# Patient Record
Sex: Male | Born: 1965
Health system: Southern US, Community
[De-identification: ages and names within clinical notes are randomized; demographics above are authoritative.]

## PROBLEM LIST (undated history)

## (undated) DIAGNOSIS — G4730 Sleep apnea, unspecified: Secondary | ICD-10-CM

## (undated) DIAGNOSIS — F32A Depression, unspecified: Secondary | ICD-10-CM

## (undated) DIAGNOSIS — I10 Essential (primary) hypertension: Secondary | ICD-10-CM

## (undated) DIAGNOSIS — I639 Cerebral infarction, unspecified: Secondary | ICD-10-CM

## (undated) DIAGNOSIS — M1990 Unspecified osteoarthritis, unspecified site: Secondary | ICD-10-CM

## (undated) DIAGNOSIS — I700 Atherosclerosis of aorta: Secondary | ICD-10-CM

## (undated) DIAGNOSIS — G894 Chronic pain syndrome: Secondary | ICD-10-CM

## (undated) DIAGNOSIS — I517 Cardiomegaly: Secondary | ICD-10-CM

## (undated) DIAGNOSIS — R519 Headache, unspecified: Secondary | ICD-10-CM

## (undated) DIAGNOSIS — I712 Thoracic aortic aneurysm, without rupture, unspecified: Secondary | ICD-10-CM

## (undated) DIAGNOSIS — E785 Hyperlipidemia, unspecified: Secondary | ICD-10-CM

## (undated) DIAGNOSIS — M5416 Radiculopathy, lumbar region: Secondary | ICD-10-CM

## (undated) DIAGNOSIS — R569 Unspecified convulsions: Secondary | ICD-10-CM

## (undated) HISTORY — DX: Unspecified convulsions: R56.9

## (undated) HISTORY — DX: Cerebral infarction, unspecified: I63.9

## (undated) HISTORY — PX: JOINT REPLACEMENT: SHX530

## (undated) HISTORY — PX: BACK SURGERY: SHX140

---

## 2002-07-22 HISTORY — PX: LUMBAR FUSION: SHX111

## 2003-04-08 ENCOUNTER — Encounter: Payer: Self-pay | Admitting: Neurosurgery

## 2003-04-13 ENCOUNTER — Inpatient Hospital Stay (HOSPITAL_COMMUNITY): Admission: RE | Admit: 2003-04-13 | Discharge: 2003-04-18 | Payer: Self-pay | Admitting: Neurosurgery

## 2003-04-13 ENCOUNTER — Encounter: Payer: Self-pay | Admitting: Neurosurgery

## 2003-05-24 ENCOUNTER — Encounter: Admission: RE | Admit: 2003-05-24 | Discharge: 2003-05-24 | Payer: Self-pay | Admitting: Neurosurgery

## 2003-10-13 ENCOUNTER — Encounter: Admission: RE | Admit: 2003-10-13 | Discharge: 2003-10-13 | Payer: Self-pay | Admitting: Neurosurgery

## 2004-01-10 ENCOUNTER — Encounter: Admission: RE | Admit: 2004-01-10 | Discharge: 2004-01-10 | Payer: Self-pay | Admitting: Neurosurgery

## 2004-05-24 ENCOUNTER — Emergency Department: Payer: Self-pay | Admitting: Emergency Medicine

## 2010-11-16 ENCOUNTER — Emergency Department: Payer: Self-pay | Admitting: Emergency Medicine

## 2012-09-19 ENCOUNTER — Emergency Department: Payer: Self-pay | Admitting: Emergency Medicine

## 2015-01-07 ENCOUNTER — Emergency Department
Admission: EM | Admit: 2015-01-07 | Discharge: 2015-01-07 | Disposition: A | Discharge status: Home / Self Care | Payer: Managed Care, Other (non HMO) | Attending: Emergency Medicine | Admitting: Emergency Medicine

## 2015-01-07 ENCOUNTER — Encounter: Payer: Self-pay | Admitting: Emergency Medicine

## 2015-01-07 DIAGNOSIS — M79651 Pain in right thigh: Secondary | ICD-10-CM | POA: Diagnosis present

## 2015-01-07 DIAGNOSIS — I10 Essential (primary) hypertension: Secondary | ICD-10-CM | POA: Insufficient documentation

## 2015-01-07 DIAGNOSIS — M7918 Myalgia, other site: Secondary | ICD-10-CM

## 2015-01-07 DIAGNOSIS — M791 Myalgia: Secondary | ICD-10-CM | POA: Diagnosis not present

## 2015-01-07 HISTORY — DX: Essential (primary) hypertension: I10

## 2015-01-07 MED ORDER — DEXAMETHASONE SODIUM PHOSPHATE 10 MG/ML IJ SOLN
INTRAMUSCULAR | Status: AC
  Administered 2015-01-07: 10 mg via INTRAMUSCULAR
  Filled 2015-01-07: qty 1

## 2015-01-07 MED ORDER — ORPHENADRINE CITRATE 30 MG/ML IJ SOLN
INTRAMUSCULAR | Status: AC
  Administered 2015-01-07: 60 mg via INTRAMUSCULAR
  Filled 2015-01-07: qty 2

## 2015-01-07 MED ORDER — METHOCARBAMOL 750 MG PO TABS: 1500.0000 mg | ORAL_TABLET | Freq: Four times a day (QID) | ORAL | Status: DC

## 2015-01-07 MED ORDER — HYDROMORPHONE HCL 1 MG/ML IJ SOLN
INTRAMUSCULAR | Status: AC
  Administered 2015-01-07: 1 mg via INTRAMUSCULAR
  Filled 2015-01-07: qty 1

## 2015-01-07 MED ORDER — METHYLPREDNISOLONE 4 MG PO TBPK: ORAL_TABLET | ORAL | Status: DC

## 2015-01-07 MED ORDER — TRAMADOL HCL 50 MG PO TABS: 50.0000 mg | ORAL_TABLET | Freq: Four times a day (QID) | ORAL | Status: DC | PRN

## 2015-01-07 MED ORDER — DEXAMETHASONE SODIUM PHOSPHATE 10 MG/ML IJ SOLN
10.0000 mg | Freq: Once | INTRAMUSCULAR | Status: AC
  Administered 2015-01-07: 10 mg via INTRAMUSCULAR

## 2015-01-07 MED ORDER — ORPHENADRINE CITRATE 30 MG/ML IJ SOLN
60.0000 mg | Freq: Once | INTRAMUSCULAR | Status: AC
  Administered 2015-01-07: 60 mg via INTRAMUSCULAR

## 2015-01-07 MED ORDER — HYDROMORPHONE HCL 1 MG/ML IJ SOLN
1.0000 mg | Freq: Once | INTRAMUSCULAR | Status: AC
  Administered 2015-01-07: 1 mg via INTRAMUSCULAR

## 2015-01-07 NOTE — ED Provider Notes (Signed)
[facility] Emergency Department Provider Note  ____________________________________________  Time seen: Approximately 2:49 PM  I have reviewed the triage vital signs and the nursing notes.   HISTORY  Chief Complaint Leg Pain    HPI Jake Kirby is a 49 y.o. male complaining of onset of right upper leg pain times one day. Patient state pain is in the upper leg radiates around to the right hip. Stated pain increases was trying to ambulate or straighten his leg out. Patient denies any other radicular component to this pain. Patient denies any bladder or bowel dysfunction. Patient states he had a episode like this before and was diagnosed with sciatica. Patient is rating his pain as a 10 over 10.   Past Medical History  Diagnosis Date  . Hypertension     There are no active problems to display for this patient.   Past Surgical History  Procedure Laterality Date  . Back surgery      No current outpatient prescriptions on file.  Allergies Review of patient's allergies indicates no known allergies.  No family history on file.  Social History History  Substance Use Topics  . Smoking status: Never Smoker   . Smokeless tobacco: Not on file  . Alcohol Use: Yes    Review of Systems Constitutional: No fever/chills Eyes: No visual changes. ENT: No sore throat. Cardiovascular: Denies chest pain. Respiratory: Denies shortness of breath. Gastrointestinal: No abdominal pain.  No nausea, no vomiting.  No diarrhea.  No constipation. Genitourinary: Negative for dysuria. Musculoskeletal: Positive for right leg pain Skin: Negative for rash. Neurological: Negative for headaches, focal weakness or numbness. Endocrine:Hypertension 10-point ROS otherwise negative.  ____________________________________________   PHYSICAL EXAM:  VITAL SIGNS: ED Triage Vitals  Enc Vitals Group     BP 01/07/15 1355 137/96 mmHg     Pulse Rate 01/07/15 1355 84     Resp  01/07/15 1355 18     Temp 01/07/15 1355 98.4 F (36.9 C)     Temp Source 01/07/15 1355 Oral     SpO2 01/07/15 1355 98 %     Weight 01/07/15 1355 265 lb (120.203 kg)     Height 01/07/15 1355 5\' 8"  (1.727 m)     Head Cir --      Peak Flow --      Pain Score 01/07/15 1358 10     Pain Loc --      Pain Edu? --      Excl. in [place]? --     Constitutional: Alert and oriented. Moderate distress Eyes: Conjunctivae are normal. PERRL. EOMI. Head: Atraumatic. Nose: No congestion/rhinnorhea. Mouth/Throat: Mucous membranes are moist.  Oropharynx non-erythematous. Neck: No stridor. No deformity for nuchal range of motion nontender palpation Hematological/Lymphatic/Immunilogical: No cervical lymphadenopathy. Cardiovascular: Normal rate, regular rhythm. Grossly normal heart sounds.  Good peripheral circulation. Respiratory: Normal respiratory effort.  No retractions. Lungs CTAB. Gastrointestinal: Soft and nontender. No distention. No abdominal bruits. No CVA tenderness. Musculoskeletal: No deformity of the right lower extremity no obvious edema or erythema. Patient is tender palpation at greater trochanter area and also in the right inguinal area. Patient has a positive straight leg raise test. Complaint of pain with ambulation. Neurologic:  Normal speech and language. No gross focal neurologic deficits are appreciated. Speech is normal. No gait instability. Skin:  Skin is warm, dry and intact. No rash noted. Psychiatric: Mood and affect are normal. Speech and behavior are normal.  ____________________________________________   LABS (all labs ordered are listed, but  only abnormal results are displayed)  Labs Reviewed - No data to display ____________________________________________  EKG   ____________________________________________  RADIOLOGY   ____________________________________________   PROCEDURES  Procedure(s) performed: None  Critical Care performed:  No  ____________________________________________   INITIAL IMPRESSION / ASSESSMENT AND PLAN / ED COURSE  Pertinent labs & imaging results that were available during my care of the patient were reviewed by me and considered in my medical decision making (see chart for details). Muscle strain right leg ____________________________________________   FINAL CLINICAL IMPRESSION(S) / ED DIAGNOSES  Final diagnoses:  Muscle ache of extremity      Sable Feil, PA-C 01/07/15 [place], MD 01/09/15 2241

## 2015-01-07 NOTE — ED Notes (Signed)
NAD noted at time of D/C. Pt taken to lobby via wheelchair.

## 2015-01-07 NOTE — ED Notes (Signed)
No edema noted, pt thinks is sciatica

## 2015-01-07 NOTE — ED Notes (Signed)
Developed lower back pain which radiates into right leg yesterday. increasedpain with ambulation.

## 2015-04-01 ENCOUNTER — Emergency Department
Admission: EM | Admit: 2015-04-01 | Discharge: 2015-04-01 | Disposition: A | Discharge status: Home / Self Care | Payer: Managed Care, Other (non HMO) | Attending: Emergency Medicine | Admitting: Emergency Medicine

## 2015-04-01 ENCOUNTER — Encounter: Payer: Self-pay | Admitting: Emergency Medicine

## 2015-04-01 ENCOUNTER — Emergency Department: Payer: Managed Care, Other (non HMO)

## 2015-04-01 DIAGNOSIS — R319 Hematuria, unspecified: Secondary | ICD-10-CM | POA: Diagnosis not present

## 2015-04-01 DIAGNOSIS — N281 Cyst of kidney, acquired: Secondary | ICD-10-CM | POA: Diagnosis not present

## 2015-04-01 DIAGNOSIS — M5432 Sciatica, left side: Secondary | ICD-10-CM | POA: Diagnosis not present

## 2015-04-01 DIAGNOSIS — I10 Essential (primary) hypertension: Secondary | ICD-10-CM | POA: Insufficient documentation

## 2015-04-01 DIAGNOSIS — Z7952 Long term (current) use of systemic steroids: Secondary | ICD-10-CM | POA: Insufficient documentation

## 2015-04-01 DIAGNOSIS — Z87891 Personal history of nicotine dependence: Secondary | ICD-10-CM | POA: Insufficient documentation

## 2015-04-01 DIAGNOSIS — M25512 Pain in left shoulder: Secondary | ICD-10-CM | POA: Insufficient documentation

## 2015-04-01 DIAGNOSIS — Z79899 Other long term (current) drug therapy: Secondary | ICD-10-CM | POA: Diagnosis not present

## 2015-04-01 DIAGNOSIS — R109 Unspecified abdominal pain: Secondary | ICD-10-CM | POA: Diagnosis present

## 2015-04-01 DIAGNOSIS — Z7982 Long term (current) use of aspirin: Secondary | ICD-10-CM | POA: Insufficient documentation

## 2015-04-01 DIAGNOSIS — R1032 Left lower quadrant pain: Secondary | ICD-10-CM

## 2015-04-01 LAB — COMPREHENSIVE METABOLIC PANEL
ALT: 22 U/L (ref 17–63)
AST: 18 U/L (ref 15–41)
Albumin: 4 g/dL (ref 3.5–5.0)
Alkaline Phosphatase: 68 U/L (ref 38–126)
Anion gap: 8 (ref 5–15)
BUN: 16 mg/dL (ref 6–20)
CO2: 29 mmol/L (ref 22–32)
Calcium: 9 mg/dL (ref 8.9–10.3)
Chloride: 105 mmol/L (ref 101–111)
Creatinine, Ser: 0.92 mg/dL (ref 0.61–1.24)
GFR calc Af Amer: >60 mL/min (ref >60)
GFR calc non Af Amer: >60 mL/min (ref >60)
Glucose, Bld: 112 mg/dL — ABNORMAL HIGH (ref 65–99)
Potassium: 3.7 mmol/L (ref 3.5–5.1)
Sodium: 142 mmol/L (ref 135–145)
Total Bilirubin: 0.6 mg/dL (ref 0.3–1.2)
Total Protein: 8.1 g/dL (ref 6.5–8.1)

## 2015-04-01 LAB — URINALYSIS COMPLETE WITH MICROSCOPIC (ARMC ONLY)
Bacteria, UA: NONE SEEN
Bilirubin Urine: NEGATIVE
Glucose, UA: NEGATIVE mg/dL
Hgb urine dipstick: NEGATIVE
Ketones, ur: NEGATIVE mg/dL
Leukocytes, UA: NEGATIVE
Nitrite: NEGATIVE
Protein, ur: 30 mg/dL — AB
Specific Gravity, Urine: 1.026 (ref 1.005–1.030)
Squamous Epithelial / HPF: NONE SEEN
pH: 6 (ref 5.0–8.0)

## 2015-04-01 LAB — CBC
HCT: 42.2 % (ref 40.0–52.0)
Hemoglobin: 13.6 g/dL (ref 13.0–18.0)
MCH: 27.3 pg (ref 26.0–34.0)
MCHC: 32.2 g/dL (ref 32.0–36.0)
MCV: 84.7 fL (ref 80.0–100.0)
Platelets: 317 10*3/uL (ref 150–440)
RBC: 4.98 MIL/uL (ref 4.40–5.90)
RDW: 13.6 % (ref 11.5–14.5)
WBC: 8.7 10*3/uL (ref 3.8–10.6)

## 2015-04-01 LAB — LIPASE, BLOOD: Lipase: 25 U/L (ref 22–51)

## 2015-04-01 MED ORDER — IOHEXOL 350 MG/ML SOLN
125.0000 mL | Freq: Once | INTRAVENOUS | Status: AC | PRN
  Administered 2015-04-01: 125 mL via INTRAVENOUS

## 2015-04-01 MED ORDER — IBUPROFEN 600 MG PO TABS: 600.0000 mg | ORAL_TABLET | Freq: Three times a day (TID) | ORAL | Status: DC | PRN

## 2015-04-01 MED ORDER — HYDROCODONE-ACETAMINOPHEN 5-325 MG PO TABS: 1.0000 | ORAL_TABLET | Freq: Four times a day (QID) | ORAL | Status: DC | PRN

## 2015-04-01 MED ORDER — ONDANSETRON HCL 4 MG/2ML IJ SOLN
4.0000 mg | Freq: Once | INTRAMUSCULAR | Status: AC
  Administered 2015-04-01: 4 mg via INTRAVENOUS
  Filled 2015-04-01: qty 2

## 2015-04-01 MED ORDER — MORPHINE SULFATE (PF) 4 MG/ML IV SOLN
4.0000 mg | Freq: Once | INTRAVENOUS | Status: AC
  Administered 2015-04-01: 4 mg via INTRAVENOUS
  Filled 2015-04-01: qty 1

## 2015-04-01 NOTE — ED Provider Notes (Signed)
[facility] Emergency Department Provider Note   ____________________________________________  Time seen: 6 PM I have reviewed the triage vital signs and the triage nursing note.  HISTORY  Chief Complaint Abdominal Pain   Historian Patient  HPI Jake Kirby is a 49 y.o. male who states approximately 2-3 days ago he had acute onset of left upper shoulder and posterior pain which then resolved but he started having left-sided flank pain and left abdominal pain, followed by left leg pain and left leg weakness. No urinary symptoms. No dysuria and no hematuria. No chest pain or trouble breathing. States that he has had a disc rupture with posterior fusion many years ago. He does not consistently follow with the primary care physician at this point time. He states several months ago he had a similar right groin pain which was diagnosed as sciatica. He thinks this may be similar but on the other side. Patient is reporting he feels some weakness to the left lower extremity. When questioned further it sounds like this is more pain related than true weakness.    Past Medical History  Diagnosis Date  . Hypertension     There are no active problems to display for this patient.   Past Surgical History  Procedure Laterality Date  . Back surgery      Current Outpatient Rx  Name  Route  Sig  Dispense  Refill  . aspirin EC 81 MG tablet   Oral   Take 81 mg by mouth daily.         Marland Kitchen HYDROcodone-acetaminophen (NORCO/VICODIN) 5-325 MG per tablet   Oral   Take 1 tablet by mouth every 6 (six) hours as needed for moderate pain.   5 tablet   0   . ibuprofen (ADVIL,MOTRIN) 600 MG tablet   Oral   Take 1 tablet (600 mg total) by mouth every 8 (eight) hours as needed.   20 tablet   0   . methocarbamol (ROBAXIN-750) 750 MG tablet   Oral   Take 2 tablets (1,500 mg total) by mouth 4 (four) times daily.   40 tablet   0   . methylPREDNISolone (MEDROL DOSEPAK) 4 MG  TBPK tablet      Take Tapered dose as directed   21 tablet   0   . traMADol (ULTRAM) 50 MG tablet   Oral   Take 1 tablet (50 mg total) by mouth every 6 (six) hours as needed for moderate pain.   12 tablet   0     Allergies Review of patient's allergies indicates no known allergies.  No family history on file.  Social History Social History  Substance Use Topics  . Smoking status: Former Research scientist (life sciences)  . Smokeless tobacco: Never Used  . Alcohol Use: 3.0 oz/week    5 Cans of beer per week    Review of Systems  Constitutional: Negative for fever. Eyes: Negative for visual changes. ENT: Negative for sore throat. Cardiovascular: Negative for chest pain. Respiratory: Negative for shortness of breath. Gastrointestinal: Negative forvomiting and diarrhea. Genitourinary: Negative for dysuria. Musculoskeletal: Negative for back pain. Skin: Negative for rash. Neurological: Negative for headache. 10 point Review of Systems otherwise negative ____________________________________________   PHYSICAL EXAM:  VITAL SIGNS: ED Triage Vitals  Enc Vitals Group     BP 04/01/15 1600 183/102 mmHg     Pulse Rate 04/01/15 1600 80     Resp 04/01/15 1600 18     Temp 04/01/15 1600 98.4 F (36.9 C)  Temp Source 04/01/15 1600 Oral     SpO2 04/01/15 1600 96 %     Weight 04/01/15 1600 260 lb (117.935 kg)     Height 04/01/15 1600 5\' 8"  (1.727 m)     Head Cir --      Peak Flow --      Pain Score 04/01/15 1602 10     Pain Loc --      Pain Edu? --      Excl. in [place]? --      Constitutional: Alert and oriented. Well appearing and in no distress. Eyes: Conjunctivae are normal. PERRL. Normal extraocular movements. ENT   Head: Normocephalic and atraumatic.   Nose: No congestion/rhinnorhea.   Mouth/Throat: Mucous membranes are moist.   Neck: No stridor. Cardiovascular/Chest: Normal rate, regular rhythm.  No murmurs, rubs, or gallops. Respiratory: Normal respiratory effort without  tachypnea nor retractions. Breath sounds are clear and equal bilaterally. No wheezes/rales/rhonchi. Gastrointestinal: Soft. No distention, no guarding, no rebound. Moderate tenderness palpation over suprapubic and left lower quadrant.  Genitourinary/rectal:Deferred Musculoskeletal: Nontender with normal range of motion in all extremities. No joint effusions.  No lower extremity tenderness.  No edema.pain with range of motion of the left hip at the groin and left lower quadrant of the abdomen. Neurologic:  Normal speech and language. No gross or focal neurologic deficits are appreciated.patient's movement of the left lower extremity is limited by pain, however he is able to lift his leg. Skin:  Skin is warm, dry and intact. No rash noted. Psychiatric: Mood and affect are normal. Speech and behavior are normal. Patient exhibits appropriate insight and judgment.  ____________________________________________   EKG I, Lisa Roca, MD, the attending physician have personally viewed and interpreted all ECGs.  No EKG performed ____________________________________________  LABS (pertinent positives/negatives)  Lipase 25 Operative metabolic panel negative CBC within normal limits Urinalysis positive for 6-30 red blood cells  ____________________________________________  RADIOLOGY All Xrays were viewed by me. Imaging interpreted by Radiologist.  CT anterior abdomen and pelvis:  IMPRESSION: 1. No acute abnormality identified in the abdomen or pelvis. Patent arterial vasculature. 2. 2.5 cm low-density left upper pole renal lesion, indeterminate favored to represent a cyst. Consider follow-up renal ultrasound in 3-6 months. __________________________________________  PROCEDURES  Procedure(s) performed: None  Critical Care performed: None  ____________________________________________   ED COURSE / ASSESSMENT AND PLAN  CONSULTATIONS: None  Pertinent labs & imaging results that  were available during my care of the patient were reviewed by me and considered in my medical decision making (see chart for details).  Patient similar symptoms are somewhat atypical for sciatica in that he is not having any pleuritic or significant posterior leg pain, his pain is more in the left groin, however he states that he had similar pain on the other side a couple months ago in the same diagnosis was made.  He was found to have hematuria, however he is denying gross hematuria, flank pain, or any dysuria or frequency of urination, and so I think acute kidney stone is a little less likely.  Initially the patient was complaining that his left lower extremity was weak, however think it is more pain that is limiting his full range of motion. Given the atypical abdominal and, hematuria, and possibly of weakness, pain and obtain an CT of the abdomen to evaluate the aorta.   ----------------------------------------- 8:32 PM on 04/01/2015 -----------------------------------------  Neurovascular emergency or other significant intra-abdominal emergency cause found on the CT the abdomen. I will treat the  patient conservatively as likely radiculopathy or sciatica/nerve related pain. He can be treated with ibuprofen 3 times daily, as well as a few Norco tablets for severe pain. Patient is to follow-up with primary care physician, Dr. Brynda Greathouse. We discussed additional next step might include prednisone, and or MRI of the back/spine.  I discussed the incidental finding of the left kidney cyst and need for repeat ultrasound in 3-6 months.  Patient / Family / Caregiver informed of clinical course, medical decision-making process, and agree with plan.   I discussed return precautions, follow-up instructions, and discharged instructions with patient and/or family.  ___________________________________________   FINAL CLINICAL IMPRESSION(S) / ED DIAGNOSES   Final diagnoses:  Hematuria  Sciatica, left   Renal cyst, left       Lisa Roca, MD 04/01/15 2036

## 2015-04-01 NOTE — ED Notes (Addendum)
Pt reports lower abd pain pain that radiates to groin on left side for past few days . Denies vomiting or diarrhea endorses nausea. Unknown fever.

## 2015-04-01 NOTE — Discharge Instructions (Signed)
You're evaluated for left-sided leg pain, for which no certain cause was found, however I do suspect this is due to a pinched nerve such as sciatica.  First treatment is anti-inflammatory and pain medication for about 1 week. If this is not improved, you need to follow up with primary care physician to consider neck steps which might be anti-inflammatory prednisone, and/or additional imaging with an MRI of the back.  Return to the emergency department for any worsening condition including fever, skin rash, abdominal pain, black or bloody stools, weakness, numbness, or any worsening pain.  Your CAT scan showed an incidental finding of likely cyst on the left kidney. You should have your primary care doctor reviewed this finding. Radiologist has recommended a repeat ultrasound of the kidney in 3-6 months for reevaluation.   Sciatica Sciatica is pain, weakness, numbness, or tingling along the path of the sciatic nerve. The nerve starts in the lower back and runs down the back of each leg. The nerve controls the muscles in the lower leg and in the back of the knee, while also providing sensation to the back of the thigh, lower leg, and the sole of your foot. Sciatica is a symptom of another medical condition. For instance, nerve damage or certain conditions, such as a herniated disk or bone spur on the spine, pinch or put pressure on the sciatic nerve. This causes the pain, weakness, or other sensations normally associated with sciatica. Generally, sciatica only affects one side of the body. CAUSES   Herniated or slipped disc.  Degenerative disk disease.  A pain disorder involving the narrow muscle in the buttocks (piriformis syndrome).  Pelvic injury or fracture.  Pregnancy.  Tumor (rare). SYMPTOMS  Symptoms can vary from mild to very severe. The symptoms usually travel from the low back to the buttocks and down the back of the leg. Symptoms can include:  Mild tingling or dull aches in the  lower back, leg, or hip.  Numbness in the back of the calf or sole of the foot.  Burning sensations in the lower back, leg, or hip.  Sharp pains in the lower back, leg, or hip.  Leg weakness.  Severe back pain inhibiting movement. These symptoms may get worse with coughing, sneezing, laughing, or prolonged sitting or standing. Also, being overweight may worsen symptoms. DIAGNOSIS  Your caregiver will perform a physical exam to look for common symptoms of sciatica. He or she may ask you to do certain movements or activities that would trigger sciatic nerve pain. Other tests may be performed to find the cause of the sciatica. These may include:  Blood tests.  X-rays.  Imaging tests, such as an MRI or CT scan. TREATMENT  Treatment is directed at the cause of the sciatic pain. Sometimes, treatment is not necessary and the pain and discomfort goes away on its own. If treatment is needed, your caregiver may suggest:  Over-the-counter medicines to relieve pain.  Prescription medicines, such as anti-inflammatory medicine, muscle relaxants, or narcotics.  Applying heat or ice to the painful area.  Steroid injections to lessen pain, irritation, and inflammation around the nerve.  Reducing activity during periods of pain.  Exercising and stretching to strengthen your abdomen and improve flexibility of your spine. Your caregiver may suggest losing weight if the extra weight makes the back pain worse.  Physical therapy.  Surgery to eliminate what is pressing or pinching the nerve, such as a bone spur or part of a herniated disk. HOME CARE INSTRUCTIONS  Only take over-the-counter or prescription medicines for pain or discomfort as directed by your caregiver.  Apply ice to the affected area for 20 minutes, 3-4 times a day for the first 48-72 hours. Then try heat in the same way.  Exercise, stretch, or perform your usual activities if these do not aggravate your pain.  Attend physical  therapy sessions as directed by your caregiver.  Keep all follow-up appointments as directed by your caregiver.  Do not wear high heels or shoes that do not provide proper support.  Check your mattress to see if it is too soft. A firm mattress may lessen your pain and discomfort. SEEK IMMEDIATE MEDICAL CARE IF:   You lose control of your bowel or bladder (incontinence).  You have increasing weakness in the lower back, pelvis, buttocks, or legs.  You have redness or swelling of your back.  You have a burning sensation when you urinate.  You have pain that gets worse when you lie down or awakens you at night.  Your pain is worse than you have experienced in the past.  Your pain is lasting longer than 4 weeks.  You are suddenly losing weight without reason. MAKE SURE YOU:  Understand these instructions.  Will watch your condition.  Will get help right away if you are not doing well or get worse. Document Released: 07/02/2001 Document Revised: 01/07/2012 Document Reviewed: 11/17/2011 [facility] Patient Information 2015 [place], [place]. This information is not intended to replace advice given to you by your health care provider. Make sure you discuss any questions you have with your health care provider.

## 2017-02-23 ENCOUNTER — Emergency Department
Admission: EM | Admit: 2017-02-23 | Discharge: 2017-02-23 | Disposition: A | Discharge status: Home / Self Care | Payer: Commercial Managed Care - PPO | Attending: Emergency Medicine | Admitting: Emergency Medicine

## 2017-02-23 ENCOUNTER — Encounter: Payer: Self-pay | Admitting: Emergency Medicine

## 2017-02-23 DIAGNOSIS — Z9114 Patient's other noncompliance with medication regimen: Secondary | ICD-10-CM | POA: Insufficient documentation

## 2017-02-23 DIAGNOSIS — Z7982 Long term (current) use of aspirin: Secondary | ICD-10-CM | POA: Insufficient documentation

## 2017-02-23 DIAGNOSIS — Z87891 Personal history of nicotine dependence: Secondary | ICD-10-CM | POA: Diagnosis not present

## 2017-02-23 DIAGNOSIS — M549 Dorsalgia, unspecified: Secondary | ICD-10-CM | POA: Diagnosis present

## 2017-02-23 DIAGNOSIS — M62830 Muscle spasm of back: Secondary | ICD-10-CM | POA: Insufficient documentation

## 2017-02-23 DIAGNOSIS — I10 Essential (primary) hypertension: Secondary | ICD-10-CM | POA: Diagnosis not present

## 2017-02-23 LAB — URINALYSIS, COMPLETE (UACMP) WITH MICROSCOPIC
Bacteria, UA: NONE SEEN
Bilirubin Urine: NEGATIVE
Glucose, UA: NEGATIVE mg/dL
Hgb urine dipstick: NEGATIVE
Ketones, ur: NEGATIVE mg/dL
Leukocytes, UA: NEGATIVE
Nitrite: NEGATIVE
Protein, ur: NEGATIVE mg/dL
RBC / HPF: NONE SEEN RBC/hpf (ref 0–5)
Specific Gravity, Urine: 1.017 (ref 1.005–1.030)
Squamous Epithelial / HPF: NONE SEEN
pH: 6 (ref 5.0–8.0)

## 2017-02-23 MED ORDER — IBUPROFEN 600 MG PO TABS: 600.0000 mg | ORAL_TABLET | Freq: Three times a day (TID) | ORAL | 0 refills | Status: DC | PRN

## 2017-02-23 MED ORDER — KETOROLAC TROMETHAMINE 30 MG/ML IJ SOLN
30.0000 mg | Freq: Once | INTRAMUSCULAR | Status: AC
  Administered 2017-02-23: 30 mg via INTRAMUSCULAR
  Filled 2017-02-23: qty 1

## 2017-02-23 MED ORDER — HYDROCODONE-ACETAMINOPHEN 5-325 MG PO TABS: 1.0000 | ORAL_TABLET | Freq: Four times a day (QID) | ORAL | 0 refills | Status: DC | PRN

## 2017-02-23 MED ORDER — METHOCARBAMOL 500 MG PO TABS: ORAL_TABLET | ORAL | 0 refills | Status: DC

## 2017-02-23 NOTE — ED Provider Notes (Signed)
[facility] Emergency Department Provider Note  ____________________________________________   First MD Initiated Contact with Patient 02/23/17 1231     (approximate)  I have reviewed the triage vital signs and the nursing notes.   HISTORY  Chief Complaint Back Pain   HPI Jake Kirby is a 51 y.o. male is here with complaint of back pain. Patient states that pain started 2 days ago suddenly without any known injury. He states the pain is mostly on his right side. He denies any UTIs or history of kidney stones but is worried about having a kidney stone after talking to people. He denies any incontinence of bowel or bladder. He has taken a BC powder for his pain without results. He states he has some "numbness and tingling in his legs". Patient continues to ambulate without any assistance and drove himself to the emergency room. Currently he is hypertensive and states that he has not taken his blood pressure medication on a regular basis because he controls it with diet. He denies any symptoms. He rates his pain as a 9/10.   Past Medical History:  Diagnosis Date  . Hypertension     There are no active problems to display for this patient.   Past Surgical History:  Procedure Laterality Date  . BACK SURGERY      Prior to Admission medications   Medication Sig Start Date End Date Taking? Authorizing Provider  aspirin EC 81 MG tablet Take 81 mg by mouth daily.    [provider]  HYDROcodone-acetaminophen (NORCO/VICODIN) 5-325 MG tablet Take 1 tablet by mouth every 6 (six) hours as needed for moderate pain. 02/23/17   Johnn Hai, PA-C  ibuprofen (ADVIL,MOTRIN) 600 MG tablet Take 1 tablet (600 mg total) by mouth every 8 (eight) hours as needed. 02/23/17   Johnn Hai, PA-C  methocarbamol (ROBAXIN) 500 MG tablet 1-2 every 6 hours prn muscle spasms 02/23/17   Johnn Hai, PA-C    Allergies Patient has no known allergies.  No family  history on file.  Social History Social History  Substance Use Topics  . Smoking status: Former Research scientist (life sciences)  . Smokeless tobacco: Never Used  . Alcohol use 3.0 oz/week    5 Cans of beer per week    Review of Systems Constitutional: No fever/chills Cardiovascular: Denies chest pain. Respiratory: Denies shortness of breath. Gastrointestinal: No abdominal pain.  No nausea, no vomiting.  Genitourinary: Negative for dysuria. Musculoskeletal: Positive for back pain. Positive for back surgery. Skin: Negative for rash. Neurological: Negative for headaches, focal weakness. Positive for "numbness" bilateral legs.   ____________________________________________   PHYSICAL EXAM:  VITAL SIGNS: ED Triage Vitals  Enc Vitals Group     BP 02/23/17 1150 (!) 198/111     Pulse Rate 02/23/17 1149 78     Resp 02/23/17 1149 16     Temp 02/23/17 1149 98.6 F (37 C)     Temp Source 02/23/17 1149 Oral     SpO2 02/23/17 1149 96 %     Weight 02/23/17 1149 255 lb (115.7 kg)     Height 02/23/17 1149 5\' 8"  (1.727 m)     Head Circumference --      Peak Flow --      Pain Score 02/23/17 1148 9     Pain Loc --      Pain Edu? --      Excl. in [place]? --    Constitutional: Alert and oriented. Well appearing and in  no acute distress. Eyes: Conjunctivae are normal. PERRL. EOMI. Head: Atraumatic. Neck: No stridor.   Cardiovascular: Normal rate, regular rhythm. Grossly normal heart sounds.  Good peripheral circulation. Respiratory: Normal respiratory effort.  No retractions. Lungs CTAB. Gastrointestinal: Soft and nontender. No distention. Questionable positive right CVA tenderness. Musculoskeletal: There is no point tenderness on palpation of the thoracic or lumbar spine. No gross deformity is noted. There is moderate tenderness on palpation of the right lateral soft tissues. Range of motion is restricted secondary to patient's discomfort. Good muscle strength lower legs bilaterally. Straight leg raises are  negative. Neurologic:  Normal speech and language. No gross focal neurologic deficits are appreciated. Reflexes are 2+ bilaterally. No gait instability. Skin:  Skin is warm, dry and intact. No rash noted. Psychiatric: Mood and affect are normal. Speech and behavior are normal.  ____________________________________________   LABS (all labs ordered are listed, but only abnormal results are displayed)  Labs Reviewed  URINALYSIS, COMPLETE (UACMP) WITH MICROSCOPIC - Abnormal; Notable for the following:       Result Value   Color, Urine YELLOW (*)    APPearance CLEAR (*)    All other components within normal limits     PROCEDURES  Procedure(s) performed: None  Procedures  Critical Care performed: No  ____________________________________________   INITIAL IMPRESSION / ASSESSMENT AND PLAN / ED COURSE  Pertinent labs & imaging results that were available during my care of the patient were reviewed by me and considered in my medical decision making (see chart for details).  Patient is follow-up with Dr. Brynda Greathouse as his blood pressure was 198/111 and patient is not currently taking his blood pressure medication daily. He is given a prescription for Norco one every 6 hours as needed for moderate pain, ibuprofen one 3 times a day with food as needed for pain and inflammation and Robaxin 500 mg one or 2 tablets 4 times a day as needed for muscle spasms. He is aware that he cannot drive or operate machinery while taking Norco and Robaxin. He is encouraged to use moist heat or ice to his back as needed for comfort. His blood pressure was rechecked prior to discharge and was 194/109. He is encouraged to take his blood pressure medication that he has a home. He was asymptomatic prior to discharge.   ___________________________________________   FINAL CLINICAL IMPRESSION(S) / ED DIAGNOSES  Final diagnoses:  Muscle spasm of back  Hypertension, uncontrolled  Noncompliance with medications       NEW MEDICATIONS STARTED DURING THIS VISIT:  Discharge Medication List as of 02/23/2017  2:26 PM       Note:  This document was prepared using Dragon voice recognition software and may include unintentional dictation errors.    Johnn Hai, PA-C 02/23/17 1617    Lisa Roca, MD 02/25/17 [phone]

## 2017-02-23 NOTE — Discharge Instructions (Signed)
Follow-up with Dr. Brynda Greathouse for recheck of your blood pressure. Today you're blood pressure was 198/111. Begin taking your blood pressure medication as directed. Also Norco every 6 hours as needed for moderate pain, ibuprofen 3 times a day with food for pain and inflammation, and Robaxin 500 mg   1-2 tablets 4 times a day for muscle spasms. You may not drive or operate machinery while taking pain medication for the muscle relaxant. Also use moist heat or ice to your back as needed for comfort.

## 2017-02-23 NOTE — ED Triage Notes (Signed)
Pt to ED c/o mid-lower back pain that started on Friday. Pt states that pain started suddenly without known injury. Pt denies any loss of bowel or bladder control. Pt states that he does have some numbness and tingling in his legs. Pt was ambulatory to triage without difficulty.  Pt was hypertensive in triage, states that he takes blood pressure medication sometimes but mostly controls it with diet. Pt is asymptomatic at this time.

## 2017-02-23 NOTE — ED Notes (Signed)
NAD noted at time of D/C. Pt denies questions or concerns. Pt ambulatory to the lobby at this time.  

## 2017-02-23 NOTE — ED Notes (Signed)
Pt instructed to follow up with PCP regarding BP and to started taking his BP medication. PA aware, states okay for D/C.

## 2018-01-07 ENCOUNTER — Emergency Department: Payer: Commercial Managed Care - PPO

## 2018-01-07 ENCOUNTER — Emergency Department
Admission: EM | Admit: 2018-01-07 | Discharge: 2018-01-07 | Disposition: A | Discharge status: Home / Self Care | Payer: Commercial Managed Care - PPO | Attending: Emergency Medicine | Admitting: Emergency Medicine

## 2018-01-07 DIAGNOSIS — M25551 Pain in right hip: Secondary | ICD-10-CM | POA: Diagnosis present

## 2018-01-07 DIAGNOSIS — Z7982 Long term (current) use of aspirin: Secondary | ICD-10-CM | POA: Insufficient documentation

## 2018-01-07 DIAGNOSIS — Z87891 Personal history of nicotine dependence: Secondary | ICD-10-CM | POA: Insufficient documentation

## 2018-01-07 DIAGNOSIS — M1611 Unilateral primary osteoarthritis, right hip: Secondary | ICD-10-CM | POA: Diagnosis not present

## 2018-01-07 DIAGNOSIS — I10 Essential (primary) hypertension: Secondary | ICD-10-CM | POA: Insufficient documentation

## 2018-01-07 MED ORDER — MELOXICAM 15 MG PO TABS: 15.0000 mg | ORAL_TABLET | Freq: Every day | ORAL | 0 refills | Status: DC

## 2018-01-07 NOTE — Discharge Instructions (Addendum)
Take medication as directed and follow-up PCP for continued care.

## 2018-01-07 NOTE — ED Triage Notes (Addendum)
Right hip pain X 2 week. Worse with walking. No injury. Pt alert and oriented X4, active, cooperative, pt in NAD. RR even and unlabored, color WNL.    Is not taking medications for his hypertension.

## 2018-01-07 NOTE — ED Provider Notes (Signed)
[facility] Emergency Department Provider Note   ____________________________________________   First MD Initiated Contact with Patient 01/07/18 1432     (approximate)  I have reviewed the triage vital signs and the nursing notes.   HISTORY  Chief Complaint Hip Pain    HPI Jake Kirby is a 52 y.o. male patient presents with right hip pain for 2 weeks.  Patient pain increased with ambulation.  Patient denies provocative incident for complaint.  Patient rates pain as a 10/10.  Patient described pain is "achy".  No palliative measures for complaint.  Patient has history of hypertension and will see new PCP to restart medications.  Past Medical History:  Diagnosis Date  . Hypertension     There are no active problems to display for this patient.   Past Surgical History:  Procedure Laterality Date  . BACK SURGERY      Prior to Admission medications   Medication Sig Start Date End Date Taking? Authorizing Provider  aspirin EC 81 MG tablet Take 81 mg by mouth daily.    [provider]  HYDROcodone-acetaminophen (NORCO/VICODIN) 5-325 MG tablet Take 1 tablet by mouth every 6 (six) hours as needed for moderate pain. 02/23/17   Johnn Hai, PA-C  ibuprofen (ADVIL,MOTRIN) 600 MG tablet Take 1 tablet (600 mg total) by mouth every 8 (eight) hours as needed. 02/23/17   Johnn Hai, PA-C  meloxicam (MOBIC) 15 MG tablet Take 1 tablet (15 mg total) by mouth daily. 01/07/18   Sable Feil, PA-C  methocarbamol (ROBAXIN) 500 MG tablet 1-2 every 6 hours prn muscle spasms 02/23/17   Johnn Hai, PA-C    Allergies Patient has no known allergies.  No family history on file.  Social History Social History   Tobacco Use  . Smoking status: Former Research scientist (life sciences)  . Smokeless tobacco: Never Used  Substance Use Topics  . Alcohol use: Yes    Alcohol/week: 3.0 oz    Types: 5 Cans of beer per week  . Drug use: No    Review of  Systems Constitutional: No fever/chills Eyes: No visual changes. ENT: No sore throat. Cardiovascular: Denies chest pain. Respiratory: Denies shortness of breath. Gastrointestinal: No abdominal pain.  No nausea, no vomiting.  No diarrhea.  No constipation. Genitourinary: Negative for dysuria. Musculoskeletal: Right hip pain. Skin: Negative for rash. Neurological: Negative for headaches, focal weakness or numbness. Endocrine:Hypertension   ____________________________________________   PHYSICAL EXAM:  VITAL SIGNS: ED Triage Vitals  Enc Vitals Group     BP 01/07/18 1424 (!) 205/97     Pulse Rate 01/07/18 1424 92     Resp 01/07/18 1424 18     Temp 01/07/18 1424 98.9 F (37.2 C)     Temp Source 01/07/18 1424 Oral     SpO2 01/07/18 1424 100 %     Weight 01/07/18 1425 260 lb (117.9 kg)     Height 01/07/18 1425 5\' 8"  (1.727 m)     Head Circumference --      Peak Flow --      Pain Score 01/07/18 1425 10     Pain Loc --      Pain Edu? --      Excl. in [place]? --     Constitutional: Alert and oriented. Well appearing and in no acute distress. Cardiovascular: Normal rate, regular rhythm. Grossly normal heart sounds.  Good peripheral circulation.  Elevated blood pressure Respiratory: Normal respiratory effort.  No retractions. Lungs CTAB. Gastrointestinal: Soft  and nontender. No distention. No abdominal bruits. No CVA tenderness. Musculoskeletal: Obvious deformity to the right hip.  No leg length discrepancy.  Patient has full equal range of motion of the right hip. Neurologic:  Normal speech and language. No gross focal neurologic deficits are appreciated. No gait instability. Skin:  Skin is warm, dry and intact. No rash noted. Psychiatric: Mood and affect are normal. Speech and behavior are normal.  ____________________________________________   LABS (all labs ordered are listed, but only abnormal results are displayed)  Labs Reviewed - No data to  display ____________________________________________  EKG   ____________________________________________  RADIOLOGY    Official radiology report(s): Dg Hip Unilat W Or Wo Pelvis 2-3 Views Right  Result Date: 01/07/2018 CLINICAL DATA:  Acute onset right hip pain 2 weeks ago. No known injury. EXAM: DG HIP (WITH OR WITHOUT PELVIS) 2-3V RIGHT COMPARISON:  CT abdomen and pelvis 04/01/2015. FINDINGS: There is no acute bony or joint abnormality the right or left. The patient has right worse than left hip osteoarthritis. There is near bone-on-bone joint space narrowing on the right with some subchondral sclerosis and small cysts. Mild osteophytosis about the joint is identified. No avascular necrosis of the femoral heads. The patient is status post L5-S1 fusion. IMPRESSION: No acute abnormality. Moderate to moderately severe right and mild to moderate left hip osteoarthritis does not appear notably changed since the comparison study. Electronically Signed   By: Inge Rise M.D.   On: 01/07/2018 15:03    ____________________________________________   PROCEDURES  Procedure(s) performed: None  Procedures  Critical Care performed: No  ____________________________________________   INITIAL IMPRESSION / ASSESSMENT AND PLAN / ED COURSE  As part of my medical decision making, I reviewed the following data within the [place]    Patient presented with 2 weeks of right hip pain secondary to osteoarthritis.  Discussed x-ray findings with patient.  Patient given discharge care instruction.  Patient advised follow-up new PCP for continued care of hypertension and arthritis.     ____________________________________________   FINAL CLINICAL IMPRESSION(S) / ED DIAGNOSES  Final diagnoses:  Right hip pain  Primary osteoarthritis of right hip  Essential hypertension     ED Discharge Orders        Ordered    meloxicam (MOBIC) 15 MG tablet  Daily     01/07/18 1521        Note:  This document was prepared using Dragon voice recognition software and may include unintentional dictation errors.    Sable Feil, PA-C 01/07/18 1528    Earleen Newport, MD 01/08/18 [phone]

## 2018-03-07 ENCOUNTER — Emergency Department
Admission: EM | Admit: 2018-03-07 | Discharge: 2018-03-07 | Disposition: A | Discharge status: Home / Self Care | Payer: Commercial Managed Care - PPO | Attending: Emergency Medicine | Admitting: Emergency Medicine

## 2018-03-07 ENCOUNTER — Encounter: Payer: Self-pay | Admitting: Emergency Medicine

## 2018-03-07 ENCOUNTER — Emergency Department: Payer: Commercial Managed Care - PPO

## 2018-03-07 DIAGNOSIS — Z7982 Long term (current) use of aspirin: Secondary | ICD-10-CM | POA: Insufficient documentation

## 2018-03-07 DIAGNOSIS — I10 Essential (primary) hypertension: Secondary | ICD-10-CM | POA: Diagnosis not present

## 2018-03-07 DIAGNOSIS — M5136 Other intervertebral disc degeneration, lumbar region: Secondary | ICD-10-CM

## 2018-03-07 DIAGNOSIS — Z79899 Other long term (current) drug therapy: Secondary | ICD-10-CM | POA: Insufficient documentation

## 2018-03-07 DIAGNOSIS — M545 Low back pain: Secondary | ICD-10-CM | POA: Diagnosis present

## 2018-03-07 DIAGNOSIS — Z87891 Personal history of nicotine dependence: Secondary | ICD-10-CM | POA: Diagnosis not present

## 2018-03-07 DIAGNOSIS — M51369 Other intervertebral disc degeneration, lumbar region without mention of lumbar back pain or lower extremity pain: Secondary | ICD-10-CM

## 2018-03-07 DIAGNOSIS — M5126 Other intervertebral disc displacement, lumbar region: Secondary | ICD-10-CM | POA: Diagnosis not present

## 2018-03-07 LAB — URINALYSIS, COMPLETE (UACMP) WITH MICROSCOPIC
Bacteria, UA: NONE SEEN
Bilirubin Urine: NEGATIVE
Glucose, UA: NEGATIVE mg/dL
Hgb urine dipstick: NEGATIVE
Ketones, ur: NEGATIVE mg/dL
Leukocytes, UA: NEGATIVE
Nitrite: NEGATIVE
Protein, ur: NEGATIVE mg/dL
Specific Gravity, Urine: 1.016 (ref 1.005–1.030)
Squamous Epithelial / LPF: NONE SEEN (ref 0–5)
pH: 6 (ref 5.0–8.0)

## 2018-03-07 MED ORDER — BACLOFEN 10 MG PO TABS: 10.0000 mg | ORAL_TABLET | Freq: Every day | ORAL | 1 refills | Status: DC

## 2018-03-07 MED ORDER — KETOROLAC TROMETHAMINE 30 MG/ML IJ SOLN
30.0000 mg | Freq: Once | INTRAMUSCULAR | Status: AC
  Administered 2018-03-07: 30 mg via INTRAVENOUS
  Filled 2018-03-07: qty 1

## 2018-03-07 MED ORDER — MORPHINE SULFATE (PF) 4 MG/ML IV SOLN
4.0000 mg | Freq: Once | INTRAVENOUS | Status: AC
  Administered 2018-03-07: 4 mg via INTRAVENOUS
  Filled 2018-03-07: qty 1

## 2018-03-07 MED ORDER — TRAMADOL HCL 50 MG PO TABS: 50.0000 mg | ORAL_TABLET | Freq: Four times a day (QID) | ORAL | 0 refills | Status: DC | PRN

## 2018-03-07 MED ORDER — PREDNISONE 10 MG (21) PO TBPK: ORAL_TABLET | ORAL | 0 refills | Status: DC

## 2018-03-07 MED ORDER — ONDANSETRON HCL 4 MG/2ML IJ SOLN
4.0000 mg | Freq: Once | INTRAMUSCULAR | Status: AC
  Administered 2018-03-07: 4 mg via INTRAVENOUS
  Filled 2018-03-07: qty 2

## 2018-03-07 MED ORDER — ORPHENADRINE CITRATE 30 MG/ML IJ SOLN
60.0000 mg | Freq: Two times a day (BID) | INTRAMUSCULAR | Status: DC
  Administered 2018-03-07: 60 mg via INTRAVENOUS
  Filled 2018-03-07: qty 2

## 2018-03-07 NOTE — ED Triage Notes (Signed)
Patient presents to the ED with centralized lower back pain that radiates down left leg x 1 week.  Patient denies any recent injury or strain.  Reports a history of back problems and back surgery.  Patient states, "it feels like someone is taking vice grips and squeezing my back."  Patient is ambulatory to triage.

## 2018-03-07 NOTE — ED Provider Notes (Signed)
[facility] Emergency Department Provider Note  ____________________________________________   First MD Initiated Contact with Patient 03/07/18 1028     (approximate)  I have reviewed the triage vital signs and the nursing notes.   HISTORY  Chief Complaint Back Pain    HPI Jake Kirby is a 52 y.o. male resents to the ER C/o low back pain for several day, no known injury, pain is worse with movement, increased with bending over, dates he is having multiple spasms in the lower back.  Pain radiates to the leg.  Denies numbness, tingling, or changes in bowel/urinary habits,  Using otc meds without relief.  Patient has a history of a fusion at L5-S1.  This is done in 2004.  Patient took over-the-counter medications without any relief Remainder ros neg   Past Medical History:  Diagnosis Date  . Hypertension     There are no active problems to display for this patient.   Past Surgical History:  Procedure Laterality Date  . BACK SURGERY      Prior to Admission medications   Medication Sig Start Date End Date Taking? Authorizing Provider  aspirin EC 81 MG tablet Take 81 mg by mouth daily.    [provider]  baclofen (LIORESAL) 10 MG tablet Take 1 tablet (10 mg total) by mouth daily. 03/07/18 03/07/19  Harland Aguiniga, Linden Dolin, PA-C  HYDROcodone-acetaminophen (NORCO/VICODIN) 5-325 MG tablet Take 1 tablet by mouth every 6 (six) hours as needed for moderate pain. 02/23/17   Johnn Hai, PA-C  ibuprofen (ADVIL,MOTRIN) 600 MG tablet Take 1 tablet (600 mg total) by mouth every 8 (eight) hours as needed. 02/23/17   Johnn Hai, PA-C  meloxicam (MOBIC) 15 MG tablet Take 1 tablet (15 mg total) by mouth daily. 01/07/18   Sable Feil, PA-C  methocarbamol (ROBAXIN) 500 MG tablet 1-2 every 6 hours prn muscle spasms 02/23/17   Letitia Neri L, PA-C  predniSONE (STERAPRED UNI-PAK 21 TAB) 10 MG (21) TBPK tablet Take 6 pills on day one then decrease by 1 pill  each day 03/07/18   Versie Starks, PA-C  traMADol (ULTRAM) 50 MG tablet Take 1 tablet (50 mg total) by mouth every 6 (six) hours as needed. 03/07/18   Versie Starks, PA-C    Allergies Patient has no known allergies.  No family history on file.  Social History Social History   Tobacco Use  . Smoking status: Former Research scientist (life sciences)  . Smokeless tobacco: Never Used  Substance Use Topics  . Alcohol use: Yes    Alcohol/week: 5.0 standard drinks    Types: 5 Cans of beer per week  . Drug use: No    Review of Systems  Constitutional: No fever/chills Eyes: No visual changes. ENT: No sore throat. Respiratory: Denies cough Genitourinary: Negative for dysuria. Musculoskeletal: Positive for back pain. Skin: Negative for rash.    ____________________________________________   PHYSICAL EXAM:  VITAL SIGNS: ED Triage Vitals  Enc Vitals Group     BP 03/07/18 1013 (!) 185/115     Pulse Rate 03/07/18 1013 95     Resp 03/07/18 1013 18     Temp 03/07/18 1013 99.1 F (37.3 C)     Temp Source 03/07/18 1013 Oral     SpO2 03/07/18 1013 94 %     Weight 03/07/18 1009 240 lb (108.9 kg)     Height 03/07/18 1009 5\' 8"  (1.727 m)     Head Circumference --  Peak Flow --      Pain Score 03/07/18 1009 10     Pain Loc --      Pain Edu? --      Excl. in [place]? --     Constitutional: Alert and oriented. Well appearing and in no acute distress. Eyes: Conjunctivae are normal.  Head: Atraumatic. Nose: No congestion/rhinnorhea. Mouth/Throat: Mucous membranes are moist.   Neck:  supple no lymphadenopathy noted Cardiovascular: Normal rate, regular rhythm. Heart sounds are normal Respiratory: Normal respiratory effort.  No retractions, lungs c t a  Abd: soft nontender bs normal all 4 quad GU: deferred Musculoskeletal: FROM all extremities, warm and well perfused.  Decreased rom of back due to discomfort, lumbar spine tender in the L4-L5, unable to perform Slr due to discomfort, 10/10 strength in  great toes b/l, 10/10 strength in lower legs, n/v intact, patient keeps jerking and spasms from the lower back. Neurologic:  Normal speech and language.  Skin:  Skin is warm, dry and intact. No rash noted. Psychiatric: Mood and affect are normal. Speech and behavior are normal.  ____________________________________________   LABS (all labs ordered are listed, but only abnormal results are displayed)  Labs Reviewed  URINALYSIS, COMPLETE (UACMP) WITH MICROSCOPIC - Abnormal; Notable for the following components:      Result Value   Color, Urine YELLOW (*)    APPearance CLEAR (*)    All other components within normal limits   ____________________________________________   ____________________________________________  RADIOLOGY  CT of the lumbar spine shows a broad-based disc bulge at L4-L5, degenerative changes at the facets, and a fusion at L5-S1  ____________________________________________   PROCEDURES  Procedure(s) performed: Saline lock, Toradol 30 mg IV, Norflex 60 mg IV, morphine 4 mg IV, Zofran 4 mg IV  Procedures    ____________________________________________   INITIAL IMPRESSION / ASSESSMENT AND PLAN / ED COURSE  Pertinent labs & imaging results that were available during my care of the patient were reviewed by me and considered in my medical decision making (see chart for details).   Patient is a 52 year old male presents emergency department complaining of low back pain with severe spasms.  He denies any injury.  He states he has a history of a fusion at L5-S1.  He states the pain has been increasing over the past week.  Feels like a vice and squeezing in his back.  He denies abdominal pain.  On physical exam patient appears well but is in pain.  The lumbar spine is mildly tender and he is having spasms throughout the abdomen and lower back.  10 out of 10 strength in the great toes and lower extremities.  He is neurovascularly intact.  Saline lock, Toradol 30  mg IV, Norflex 60 mg IV, Zofran 4 mg IV, morphine 4 mg IV were given for pain.  Patient finally had comfort after the 4 mg of morphine.  CT of the lumbar spine showed a broad-based disc bulge at L4-L5.  Degenerative changes at the facets.  And some stenosis along L2-3  Discussed the findings with patient.  He is to follow-up with a orthopedic doctor.  He has a choice to either see someone at [facility] or either he could go to [facility] for spine center.  He was given a prescription for Sterapred, baclofen, and tramadol.  He is to apply ice to lower back.  He was discharged in stable condition     As part of my medical decision making, I reviewed the following data  within the [place] notes reviewed and incorporated, Labs reviewed UA negative for hematuria, Old chart reviewed, Radiograph reviewed CT of the lumbar spine shows a broad-based disc bulge at L4-L5, Notes from prior ED visits and [place] Controlled Substance Database  ____________________________________________   FINAL CLINICAL IMPRESSION(S) / ED DIAGNOSES  Final diagnoses:  Bulging of intervertebral disc between L4 and L5      NEW MEDICATIONS STARTED DURING THIS VISIT:  New Prescriptions   BACLOFEN (LIORESAL) 10 MG TABLET    Take 1 tablet (10 mg total) by mouth daily.   PREDNISONE (STERAPRED UNI-PAK 21 TAB) 10 MG (21) TBPK TABLET    Take 6 pills on day one then decrease by 1 pill each day   TRAMADOL (ULTRAM) 50 MG TABLET    Take 1 tablet (50 mg total) by mouth every 6 (six) hours as needed.     Note:  This document was prepared using Dragon voice recognition software and may include unintentional dictation errors.     Versie Starks, PA-C 03/07/18 1343    Earleen Newport, MD 03/07/18 1350

## 2018-03-07 NOTE — ED Notes (Signed)
Pt states " I take BC podwers for pain". Pt states he sleeps with heating pad to relief lower back pain.

## 2018-03-07 NOTE — Discharge Instructions (Signed)
Follow up with either dr Rudene Christians OR [facility].  Call and schedule an appointment.  The bulging disc at L4-L5 is causing you to have pain in the lower back.  REturn to the ER if worsening.  Take the medication as prescribed.

## 2018-03-24 ENCOUNTER — Other Ambulatory Visit: Payer: Self-pay | Admitting: Student

## 2018-03-24 DIAGNOSIS — M5416 Radiculopathy, lumbar region: Secondary | ICD-10-CM

## 2018-03-24 DIAGNOSIS — M5441 Lumbago with sciatica, right side: Secondary | ICD-10-CM

## 2018-05-10 ENCOUNTER — Ambulatory Visit
Admission: RE | Admit: 2018-05-10 | Discharge: 2018-05-10 | Disposition: A | Discharge status: Home / Self Care | Payer: Commercial Managed Care - PPO | Source: Ambulatory Visit | Attending: Student | Admitting: Student

## 2018-05-10 DIAGNOSIS — M47816 Spondylosis without myelopathy or radiculopathy, lumbar region: Secondary | ICD-10-CM | POA: Diagnosis not present

## 2018-05-10 DIAGNOSIS — M5126 Other intervertebral disc displacement, lumbar region: Secondary | ICD-10-CM | POA: Insufficient documentation

## 2018-05-10 DIAGNOSIS — M48061 Spinal stenosis, lumbar region without neurogenic claudication: Secondary | ICD-10-CM | POA: Diagnosis not present

## 2018-05-10 DIAGNOSIS — M5441 Lumbago with sciatica, right side: Secondary | ICD-10-CM | POA: Diagnosis not present

## 2018-05-10 DIAGNOSIS — M5416 Radiculopathy, lumbar region: Secondary | ICD-10-CM | POA: Diagnosis not present

## 2018-05-10 DIAGNOSIS — Z981 Arthrodesis status: Secondary | ICD-10-CM | POA: Insufficient documentation

## 2018-06-16 ENCOUNTER — Encounter: Payer: Self-pay | Admitting: Student in an Organized Health Care Education/Training Program

## 2018-06-16 ENCOUNTER — Other Ambulatory Visit: Payer: Self-pay

## 2018-06-16 ENCOUNTER — Ambulatory Visit
Payer: Commercial Managed Care - PPO | Attending: Student in an Organized Health Care Education/Training Program | Admitting: Student in an Organized Health Care Education/Training Program

## 2018-06-16 VITALS — BP 192/119 | HR 98 | Temp 97.6°F | Resp 18 | Ht 68.5 in | Wt 259.4 lb

## 2018-06-16 DIAGNOSIS — I10 Essential (primary) hypertension: Secondary | ICD-10-CM | POA: Insufficient documentation

## 2018-06-16 DIAGNOSIS — Z981 Arthrodesis status: Secondary | ICD-10-CM | POA: Diagnosis not present

## 2018-06-16 DIAGNOSIS — Z7982 Long term (current) use of aspirin: Secondary | ICD-10-CM | POA: Insufficient documentation

## 2018-06-16 DIAGNOSIS — M129 Arthropathy, unspecified: Secondary | ICD-10-CM | POA: Diagnosis not present

## 2018-06-16 DIAGNOSIS — M549 Dorsalgia, unspecified: Secondary | ICD-10-CM | POA: Diagnosis present

## 2018-06-16 DIAGNOSIS — M5441 Lumbago with sciatica, right side: Secondary | ICD-10-CM

## 2018-06-16 DIAGNOSIS — M47816 Spondylosis without myelopathy or radiculopathy, lumbar region: Secondary | ICD-10-CM

## 2018-06-16 DIAGNOSIS — M5442 Lumbago with sciatica, left side: Secondary | ICD-10-CM | POA: Diagnosis not present

## 2018-06-16 DIAGNOSIS — G894 Chronic pain syndrome: Secondary | ICD-10-CM | POA: Diagnosis not present

## 2018-06-16 DIAGNOSIS — M5416 Radiculopathy, lumbar region: Secondary | ICD-10-CM | POA: Diagnosis not present

## 2018-06-16 DIAGNOSIS — Z9889 Other specified postprocedural states: Secondary | ICD-10-CM | POA: Diagnosis not present

## 2018-06-16 DIAGNOSIS — Z87891 Personal history of nicotine dependence: Secondary | ICD-10-CM | POA: Insufficient documentation

## 2018-06-16 DIAGNOSIS — G8929 Other chronic pain: Secondary | ICD-10-CM

## 2018-06-16 NOTE — Progress Notes (Signed)
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 medial branch nerve blocks at L3, L4, L5 secondary to lumbar facet arthropathy most pronounced at L4-L5.    Ordered Lab-work, Procedure(s), Referral(s), & Consult(s): Orders Placed This Encounter  Procedures  . Lumbar Epidural Injection     Provider-requested follow-up: Return in about 2 weeks (around 06/30/2018) for Procedure.  No future appointments.  Primary Care Physician: Patient, No Pcp Per Location: [facility] Note by: Gillis Santa, M.D, Date: 06/16/2018; Time: 8:36 AM  Patient Instructions   GENERAL RISKS AND COMPLICATIONS  What are the risk, side effects and possible complications? Generally speaking, most procedures are safe.  However, with any procedure there are risks, side effects, and the possibility  of complications.  The risks and complications are dependent upon the sites that are lesioned, or the type of nerve block to be performed.  The closer the procedure is to the spine, the more serious the risks are.  Great care is taken when placing the radio frequency needles, block needles or lesioning probes, but sometimes complications can occur. 1. Infection: Any time there is an injection through the skin, there is a risk of infection.  This is why sterile conditions are used for these blocks.  There are four possible types of infection. 1. Localized skin infection. 2. Central Nervous System Infection-This can be in the form of Meningitis, which can be deadly. 3. Epidural Infections-This can be in the form of an epidural abscess, which can cause pressure inside of the spine, causing compression of the spinal cord with subsequent paralysis. This would require an emergency surgery to decompress, and there are no guarantees that the patient would recover from the paralysis. 4. Discitis-This is an infection of the intervertebral discs.  It occurs in about 1% of discography procedures.  It is difficult to treat and it may lead to surgery.        2. Pain: the needles have to go through skin and soft tissues, will cause soreness.       3. Damage to internal structures:  The nerves to be lesioned may be near blood vessels or    other nerves which can be potentially damaged.       4. Bleeding: Bleeding is more common if the patient is taking blood thinners such as  aspirin, Coumadin, Ticiid, Plavix, etc., or if he/she have some genetic predisposition  such as hemophilia. Bleeding into the spinal canal can cause compression of the spinal  cord with subsequent paralysis.  This would require an emergency surgery to  decompress and there are no guarantees that the patient would recover from the  paralysis.       5. Pneumothorax:  Puncturing of a lung is a possibility, every time a needle is introduced in  the area  of the chest or upper back.  Pneumothorax refers to free air around the  collapsed lung(s), inside of the thoracic cavity (chest cavity).  Another two possible  complications related to a similar event would include: Hemothorax and Chylothorax.   These are variations of the  Pneumothorax, where instead of air around the collapsed  lung(s), you may have blood or chyle, respectively.       6. Spinal headaches: They may occur with any procedures in the area of the spine.       7. Persistent CSF (Cerebro-Spinal Fluid) leakage: This is a rare problem, but may occur  with prolonged intrathecal or epidural catheters either due to the formation of a fistulous  track or a dural tear.       8. Nerve damage: By working so close to the spinal cord, there is always a possibility of  nerve damage, which could be as serious as a permanent spinal cord injury with  paralysis.       9. Death:  Although rare, severe deadly allergic reactions known as "Anaphylactic  reaction" can occur to any of the medications used.      10. Worsening of the symptoms:  We can always make thing worse.  What are the chances of something like this happening? Chances of any of this occuring are extremely low.  By statistics, you have more of a chance of getting killed in a motor vehicle accident: while driving to the hospital than any of the above occurring .  Nevertheless, you should be aware that they are possibilities.  In general, it is similar to taking a shower.  Everybody knows that you can slip, hit your head and get killed.  Does that mean that you should not shower again?  Nevertheless always keep in mind that statistics do not mean anything if you happen to be on the wrong side of them.  Even if a procedure has a 1 (one) in a 1,000,000 (million) chance of going wrong, it you happen to be that one..Also, keep in mind that by statistics, you have more of a chance of having something go wrong when taking medications.  Who should not have  this procedure? If you are on a blood thinning medication (e.g. Coumadin, Plavix, see list of "Blood Thinners"), or if you have an active infection going on, you should not have the procedure.  If you are taking any blood thinners, please inform your physician.  How should I prepare for this procedure?  Do not eat or drink anything at least six hours prior to the procedure.  Bring a driver with you .  It cannot be a taxi.  Come accompanied by an adult that can drive you back, and that is strong enough to help you if your legs get weak or numb from the local anesthetic.  Take all of your medicines the morning of the procedure with just enough water to swallow them.  If you have diabetes, make sure that you are scheduled to have your procedure done first thing in the morning, whenever possible.  If you have diabetes, take only half of your insulin dose and notify our nurse that you have done so as soon as you arrive at the clinic.  If you are diabetic, but only take blood sugar pills (oral hypoglycemic), then do not take them on the morning of your procedure.  You may take them after you have had the procedure.  Do not take aspirin or any aspirin-containing medications, at least eleven (11) days prior to the procedure.  They may prolong bleeding.  Wear loose fitting clothing that may be easy to take off and that you would not mind if it got stained with Betadine or blood.  Do not wear any jewelry or  perfume  Remove any nail coloring.  It will interfere with some of our monitoring equipment.  NOTE: Remember that this is not meant to be interpreted as a complete list of all possible complications.  Unforeseen problems may occur.  BLOOD THINNERS The following drugs contain aspirin or other products, which can cause increased bleeding during surgery and should not be taken for 2 weeks prior to and 1 week after surgery.  If you should need take something for relief of minor pain, you may take  acetaminophen which is found in Tylenol,m Datril, Anacin-3 and Panadol. It is not blood thinner. The products listed below are.  Do not take any of the products listed below in addition to any listed on your instruction sheet.  A.P.C or A.P.C with Codeine Codeine Phosphate Capsules #3 Ibuprofen Ridaura  ABC compound Congesprin Imuran rimadil  Advil Cope Indocin Robaxisal  Alka-Seltzer Effervescent Pain Reliever and Antacid Coricidin or Coricidin-D  Indomethacin Rufen  Alka-Seltzer plus Cold Medicine Cosprin Ketoprofen S-A-C Tablets  Anacin Analgesic Tablets or Capsules Coumadin Korlgesic Salflex  Anacin Extra Strength Analgesic tablets or capsules CP-2 Tablets Lanoril Salicylate  Anaprox Cuprimine Capsules Levenox Salocol  Anexsia-D Dalteparin Magan Salsalate  Anodynos Darvon compound Magnesium Salicylate Sine-off  Ansaid Dasin Capsules Magsal Sodium Salicylate  Anturane Depen Capsules Marnal Soma  APF Arthritis pain formula Dewitt's Pills Measurin Stanback  Argesic Dia-Gesic Meclofenamic Sulfinpyrazone  Arthritis Bayer Timed Release Aspirin Diclofenac Meclomen Sulindac  Arthritis pain formula Anacin Dicumarol Medipren Supac  Analgesic (Safety coated) Arthralgen Diffunasal Mefanamic Suprofen  Arthritis Strength Bufferin Dihydrocodeine Mepro Compound Suprol  Arthropan liquid Dopirydamole Methcarbomol with Aspirin Synalgos  ASA tablets/Enseals Disalcid Micrainin Tagament  Ascriptin Doan's Midol Talwin  Ascriptin A/D Dolene Mobidin Tanderil  Ascriptin Extra Strength Dolobid Moblgesic Ticlid  Ascriptin with Codeine Doloprin or Doloprin with Codeine Momentum Tolectin  Asperbuf Duoprin Mono-gesic Trendar  Aspergum Duradyne Motrin or Motrin IB Triminicin  Aspirin plain, buffered or enteric coated Durasal Myochrisine Trigesic  Aspirin Suppositories Easprin Nalfon Trillsate  Aspirin with Codeine Ecotrin Regular or Extra Strength Naprosyn Uracel  Atromid-S Efficin Naproxen Ursinus  Auranofin  Capsules Elmiron Neocylate Vanquish  Axotal Emagrin Norgesic Verin  Azathioprine Empirin or Empirin with Codeine Normiflo Vitamin E  Azolid Emprazil Nuprin Voltaren  Bayer Aspirin plain, buffered or children's or timed BC Tablets or powders Encaprin Orgaran Warfarin Sodium  Buff-a-Comp Enoxaparin Orudis Zorpin  Buff-a-Comp with Codeine Equegesic Os-Cal-Gesic   Buffaprin Excedrin plain, buffered or Extra Strength Oxalid   Bufferin Arthritis Strength Feldene Oxphenbutazone   Bufferin plain or Extra Strength Feldene Capsules Oxycodone with Aspirin   Bufferin with Codeine Fenoprofen Fenoprofen Pabalate or Pabalate-SF   Buffets II Flogesic Panagesic   Buffinol plain or Extra Strength Florinal or Florinal with Codeine Panwarfarin   Buf-Tabs Flurbiprofen Penicillamine   Butalbital Compound Four-way cold tablets Penicillin   Butazolidin Fragmin Pepto-Bismol   Carbenicillin Geminisyn Percodan   Carna Arthritis Reliever Geopen Persantine   Carprofen Gold's salt Persistin   Chloramphenicol Goody's Phenylbutazone   Chloromycetin Haltrain Piroxlcam   Clmetidine heparin Plaquenil   Cllnoril Hyco-pap Ponstel   Clofibrate Hydroxy chloroquine Propoxyphen         Before stopping any of these medications, be sure to consult the physician who ordered them.  Some, such as Coumadin (Warfarin) are ordered to prevent or treat serious conditions such as "deep thrombosis", "pumonary embolisms", and other heart problems.  The amount of time that you may need off of the medication may also vary with the  medication and the reason for which you were taking it.  If you are taking any of these medications, please make sure you notify your pain physician before you undergo any procedures.    Epidural Steroid Injection Patient Information  Description: The epidural space surrounds the nerves as they exit the spinal cord.  In some patients, the nerves can be compressed and inflamed by a bulging disc or a tight spinal  canal (spinal stenosis).  By injecting steroids into the epidural space, we can bring irritated nerves into direct contact with a potentially helpful medication.  These steroids act directly on the irritated nerves and can reduce swelling and inflammation which often leads to decreased pain.  Epidural steroids may be injected anywhere along the spine and from the neck to the low back depending upon the location of your pain.   After numbing the skin with local anesthetic (like Novocaine), a small needle is passed into the epidural space slowly.  You may experience a sensation of pressure while this is being done.  The entire block usually last less than 10 minutes.  Conditions which may be treated by epidural steroids:   Low back and leg pain  Neck and arm pain  Spinal stenosis  Post-laminectomy syndrome  Herpes zoster (shingles) pain  Pain from compression fractures  Preparation for the injection:  1. Do not eat any solid food or dairy products within 8 hours of your appointment.  2. You may drink clear liquids up to 3 hours before appointment.  Clear liquids include water, black coffee, juice or soda.  No milk or cream please. 3. You may take your regular medication, including pain medications, with a sip of water before your appointment  Diabetics should hold regular insulin (if taken separately) and take 1/2 normal NPH dos the morning of the procedure.  Carry some sugar containing items with you to your appointment. 4. A driver must accompany you and be prepared to drive you home after your procedure.  5. Bring all your current medications with your. 6. An IV may be inserted and sedation may be given at the discretion of the physician.   7. A blood pressure cuff, EKG and other monitors will often be applied during the procedure.  Some patients may need to have extra oxygen administered for a short period. 8. You will be asked to provide medical information, including your allergies,  prior to the procedure.  We must know immediately if you are taking blood thinners (like Coumadin/Warfarin)  Or if you are allergic to IV iodine contrast (dye). We must know if you could possible be pregnant.  Possible side-effects:  Bleeding from needle site  Infection (rare, may require surgery)  Nerve injury (rare)  Numbness & tingling (temporary)  Difficulty urinating (rare, temporary)  Spinal headache ( a headache worse with upright posture)  Light -headedness (temporary)  Pain at injection site (several days)  Decreased blood pressure (temporary)  Weakness in arm/leg (temporary)  Pressure sensation in back/neck (temporary)  Call if you experience:  Fever/chills associated with headache or increased back/neck pain.  Headache worsened by an upright position.  New onset weakness or numbness of an extremity below the injection site  Hives or difficulty breathing (go to the emergency room)  Inflammation or drainage at the infection site  Severe back/neck pain  Any new symptoms which are concerning to you  Please note:  Although the local anesthetic injected can often make your back or neck feel good for several hours  after the injection, the pain will likely return.  It takes 3-7 days for steroids to work in the epidural space.  You may not notice any pain relief for at least that one week.  If effective, we will often do a series of three injections spaced 3-6 weeks apart to maximally decrease your pain.  After the initial series, we generally will wait several months before considering a repeat injection of the same type.  If you have any questions, please call [phone] [facility]

## 2018-06-16 NOTE — Patient Instructions (Signed)
GENERAL RISKS AND COMPLICATIONS  What are the risk, side effects and possible complications? Generally speaking, most procedures are safe.  However, with any procedure there are risks, side effects, and the possibility of complications.  The risks and complications are dependent upon the sites that are lesioned, or the type of nerve block to be performed.  The closer the procedure is to the spine, the more serious the risks are.  Great care is taken when placing the radio frequency needles, block needles or lesioning probes, but sometimes complications can occur. 1. Infection: Any time there is an injection through the skin, there is a risk of infection.  This is why sterile conditions are used for these blocks.  There are four possible types of infection. 1. Localized skin infection. 2. Central Nervous System Infection-This can be in the form of Meningitis, which can be deadly. 3. Epidural Infections-This can be in the form of an epidural abscess, which can cause pressure inside of the spine, causing compression of the spinal cord with subsequent paralysis. This would require an emergency surgery to decompress, and there are no guarantees that the patient would recover from the paralysis. 4. Discitis-This is an infection of the intervertebral discs.  It occurs in about 1% of discography procedures.  It is difficult to treat and it may lead to surgery.        2. Pain: the needles have to go through skin and soft tissues, will cause soreness.       3. Damage to internal structures:  The nerves to be lesioned may be near blood vessels or    other nerves which can be potentially damaged.       4. Bleeding: Bleeding is more common if the patient is taking blood thinners such as  aspirin, Coumadin, Ticiid, Plavix, etc., or if he/she have some genetic predisposition  such as hemophilia. Bleeding into the spinal canal can cause compression of the spinal  cord with subsequent paralysis.  This would require an  emergency surgery to  decompress and there are no guarantees that the patient would recover from the  paralysis.       5. Pneumothorax:  Puncturing of a lung is a possibility, every time a needle is introduced in  the area of the chest or upper back.  Pneumothorax refers to free air around the  collapsed lung(s), inside of the thoracic cavity (chest cavity).  Another two possible  complications related to a similar event would include: Hemothorax and Chylothorax.   These are variations of the Pneumothorax, where instead of air around the collapsed  lung(s), you may have blood or chyle, respectively.       6. Spinal headaches: They may occur with any procedures in the area of the spine.       7. Persistent CSF (Cerebro-Spinal Fluid) leakage: This is a rare problem, but may occur  with prolonged intrathecal or epidural catheters either due to the formation of a fistulous  track or a dural tear.       8. Nerve damage: By working so close to the spinal cord, there is always a possibility of  nerve damage, which could be as serious as a permanent spinal cord injury with  paralysis.       9. Death:  Although rare, severe deadly allergic reactions known as "Anaphylactic  reaction" can occur to any of the medications used.      10. Worsening of the symptoms:  We can always make thing worse.    What are the chances of something like this happening? Chances of any of this occuring are extremely low.  By statistics, you have more of a chance of getting killed in a motor vehicle accident: while driving to the hospital than any of the above occurring .  Nevertheless, you should be aware that they are possibilities.  In general, it is similar to taking a shower.  Everybody knows that you can slip, hit your head and get killed.  Does that mean that you should not shower again?  Nevertheless always keep in mind that statistics do not mean anything if you happen to be on the wrong side of them.  Even if a procedure has a 1  (one) in a 1,000,000 (million) chance of going wrong, it you happen to be that one..Also, keep in mind that by statistics, you have more of a chance of having something go wrong when taking medications.  Who should not have this procedure? If you are on a blood thinning medication (e.g. Coumadin, Plavix, see list of "Blood Thinners"), or if you have an active infection going on, you should not have the procedure.  If you are taking any blood thinners, please inform your physician.  How should I prepare for this procedure?  Do not eat or drink anything at least six hours prior to the procedure.  Bring a driver with you .  It cannot be a taxi.  Come accompanied by an adult that can drive you back, and that is strong enough to help you if your legs get weak or numb from the local anesthetic.  Take all of your medicines the morning of the procedure with just enough water to swallow them.  If you have diabetes, make sure that you are scheduled to have your procedure done first thing in the morning, whenever possible.  If you have diabetes, take only half of your insulin dose and notify our nurse that you have done so as soon as you arrive at the clinic.  If you are diabetic, but only take blood sugar pills (oral hypoglycemic), then do not take them on the morning of your procedure.  You may take them after you have had the procedure.  Do not take aspirin or any aspirin-containing medications, at least eleven (11) days prior to the procedure.  They may prolong bleeding.  Wear loose fitting clothing that may be easy to take off and that you would not mind if it got stained with Betadine or blood.  Do not wear any jewelry or perfume  Remove any nail coloring.  It will interfere with some of our monitoring equipment.  NOTE: Remember that this is not meant to be interpreted as a complete list of all possible complications.  Unforeseen problems may occur.  BLOOD THINNERS The following drugs  contain aspirin or other products, which can cause increased bleeding during surgery and should not be taken for 2 weeks prior to and 1 week after surgery.  If you should need take something for relief of minor pain, you may take acetaminophen which is found in Tylenol,m Datril, Anacin-3 and Panadol. It is not blood thinner. The products listed below are.  Do not take any of the products listed below in addition to any listed on your instruction sheet.  A.P.C or A.P.C with Codeine Codeine Phosphate Capsules #3 Ibuprofen Ridaura  ABC compound Congesprin Imuran rimadil  Advil Cope Indocin Robaxisal  Alka-Seltzer Effervescent Pain Reliever and Antacid Coricidin or Coricidin-D  Indomethacin Rufen    Alka-Seltzer plus Cold Medicine Cosprin Ketoprofen S-A-C Tablets  Anacin Analgesic Tablets or Capsules Coumadin Korlgesic Salflex  Anacin Extra Strength Analgesic tablets or capsules CP-2 Tablets Lanoril Salicylate  Anaprox Cuprimine Capsules Levenox Salocol  Anexsia-D Dalteparin Magan Salsalate  Anodynos Darvon compound Magnesium Salicylate Sine-off  Ansaid Dasin Capsules Magsal Sodium Salicylate  Anturane Depen Capsules Marnal Soma  APF Arthritis pain formula Dewitt's Pills Measurin Stanback  Argesic Dia-Gesic Meclofenamic Sulfinpyrazone  Arthritis Bayer Timed Release Aspirin Diclofenac Meclomen Sulindac  Arthritis pain formula Anacin Dicumarol Medipren Supac  Analgesic (Safety coated) Arthralgen Diffunasal Mefanamic Suprofen  Arthritis Strength Bufferin Dihydrocodeine Mepro Compound Suprol  Arthropan liquid Dopirydamole Methcarbomol with Aspirin Synalgos  ASA tablets/Enseals Disalcid Micrainin Tagament  Ascriptin Doan's Midol Talwin  Ascriptin A/D Dolene Mobidin Tanderil  Ascriptin Extra Strength Dolobid Moblgesic Ticlid  Ascriptin with Codeine Doloprin or Doloprin with Codeine Momentum Tolectin  Asperbuf Duoprin Mono-gesic Trendar  Aspergum Duradyne Motrin or Motrin IB Triminicin  Aspirin  plain, buffered or enteric coated Durasal Myochrisine Trigesic  Aspirin Suppositories Easprin Nalfon Trillsate  Aspirin with Codeine Ecotrin Regular or Extra Strength Naprosyn Uracel  Atromid-S Efficin Naproxen Ursinus  Auranofin Capsules Elmiron Neocylate Vanquish  Axotal Emagrin Norgesic Verin  Azathioprine Empirin or Empirin with Codeine Normiflo Vitamin E  Azolid Emprazil Nuprin Voltaren  Bayer Aspirin plain, buffered or children's or timed BC Tablets or powders Encaprin Orgaran Warfarin Sodium  Buff-a-Comp Enoxaparin Orudis Zorpin  Buff-a-Comp with Codeine Equegesic Os-Cal-Gesic   Buffaprin Excedrin plain, buffered or Extra Strength Oxalid   Bufferin Arthritis Strength Feldene Oxphenbutazone   Bufferin plain or Extra Strength Feldene Capsules Oxycodone with Aspirin   Bufferin with Codeine Fenoprofen Fenoprofen Pabalate or Pabalate-SF   Buffets II Flogesic Panagesic   Buffinol plain or Extra Strength Florinal or Florinal with Codeine Panwarfarin   Buf-Tabs Flurbiprofen Penicillamine   Butalbital Compound Four-way cold tablets Penicillin   Butazolidin Fragmin Pepto-Bismol   Carbenicillin Geminisyn Percodan   Carna Arthritis Reliever Geopen Persantine   Carprofen Gold's salt Persistin   Chloramphenicol Goody's Phenylbutazone   Chloromycetin Haltrain Piroxlcam   Clmetidine heparin Plaquenil   Cllnoril Hyco-pap Ponstel   Clofibrate Hydroxy chloroquine Propoxyphen         Before stopping any of these medications, be sure to consult the physician who ordered them.  Some, such as Coumadin (Warfarin) are ordered to prevent or treat serious conditions such as "deep thrombosis", "pumonary embolisms", and other heart problems.  The amount of time that you may need off of the medication may also vary with the medication and the reason for which you were taking it.  If you are taking any of these medications, please make sure you notify your pain physician before you undergo any  procedures.         Epidural Steroid Injection Patient Information  Description: The epidural space surrounds the nerves as they exit the spinal cord.  In some patients, the nerves can be compressed and inflamed by a bulging disc or a tight spinal canal (spinal stenosis).  By injecting steroids into the epidural space, we can bring irritated nerves into direct contact with a potentially helpful medication.  These steroids act directly on the irritated nerves and can reduce swelling and inflammation which often leads to decreased pain.  Epidural steroids may be injected anywhere along the spine and from the neck to the low back depending upon the location of your pain.   After numbing the skin with local anesthetic (like Novocaine), a small needle is passed   into the epidural space slowly.  You may experience a sensation of pressure while this is being done.  The entire block usually last less than 10 minutes.  Conditions which may be treated by epidural steroids:   Low back and leg pain  Neck and arm pain  Spinal stenosis  Post-laminectomy syndrome  Herpes zoster (shingles) pain  Pain from compression fractures  Preparation for the injection:  1. Do not eat any solid food or dairy products within 8 hours of your appointment.  2. You may drink clear liquids up to 3 hours before appointment.  Clear liquids include water, black coffee, juice or soda.  No milk or cream please. 3. You may take your regular medication, including pain medications, with a sip of water before your appointment  Diabetics should hold regular insulin (if taken separately) and take 1/2 normal NPH dos the morning of the procedure.  Carry some sugar containing items with you to your appointment. 4. A driver must accompany you and be prepared to drive you home after your procedure.  5. Bring all your current medications with your. 6. An IV may be inserted and sedation may be given at the discretion of the  physician.   7. A blood pressure cuff, EKG and other monitors will often be applied during the procedure.  Some patients may need to have extra oxygen administered for a short period. 8. You will be asked to provide medical information, including your allergies, prior to the procedure.  We must know immediately if you are taking blood thinners (like Coumadin/Warfarin)  Or if you are allergic to IV iodine contrast (dye). We must know if you could possible be pregnant.  Possible side-effects:  Bleeding from needle site  Infection (rare, may require surgery)  Nerve injury (rare)  Numbness & tingling (temporary)  Difficulty urinating (rare, temporary)  Spinal headache ( a headache worse with upright posture)  Light -headedness (temporary)  Pain at injection site (several days)  Decreased blood pressure (temporary)  Weakness in arm/leg (temporary)  Pressure sensation in back/neck (temporary)  Call if you experience:  Fever/chills associated with headache or increased back/neck pain.  Headache worsened by an upright position.  New onset weakness or numbness of an extremity below the injection site  Hives or difficulty breathing (go to the emergency room)  Inflammation or drainage at the infection site  Severe back/neck pain  Any new symptoms which are concerning to you  Please note:  Although the local anesthetic injected can often make your back or neck feel good for several hours after the injection, the pain will likely return.  It takes 3-7 days for steroids to work in the epidural space.  You may not notice any pain relief for at least that one week.  If effective, we will often do a series of three injections spaced 3-6 weeks apart to maximally decrease your pain.  After the initial series, we generally will wait several months before considering a repeat injection of the same type.  If you have any questions, please call [phone] [facility] 

## 2018-06-16 NOTE — Progress Notes (Signed)
Safety precautions to be maintained throughout the outpatient stay will include: orient to surroundings, keep bed in low position, maintain call bell within reach at all times, provide assistance with transfer out of bed and ambulation.  

## 2018-07-01 ENCOUNTER — Ambulatory Visit (HOSPITAL_BASED_OUTPATIENT_CLINIC_OR_DEPARTMENT_OTHER): Payer: Commercial Managed Care - PPO | Admitting: Student in an Organized Health Care Education/Training Program

## 2018-07-01 ENCOUNTER — Other Ambulatory Visit: Payer: Self-pay

## 2018-07-01 ENCOUNTER — Ambulatory Visit
Admission: RE | Admit: 2018-07-01 | Discharge: 2018-07-01 | Disposition: A | Discharge status: Home / Self Care | Payer: Commercial Managed Care - PPO | Source: Ambulatory Visit | Attending: Student in an Organized Health Care Education/Training Program | Admitting: Student in an Organized Health Care Education/Training Program

## 2018-07-01 ENCOUNTER — Encounter: Payer: Self-pay | Admitting: Student in an Organized Health Care Education/Training Program

## 2018-07-01 DIAGNOSIS — M5416 Radiculopathy, lumbar region: Secondary | ICD-10-CM | POA: Insufficient documentation

## 2018-07-01 MED ORDER — SODIUM CHLORIDE (PF) 0.9 % IJ SOLN
INTRAMUSCULAR | Status: AC
  Filled 2018-07-01: qty 10

## 2018-07-01 MED ORDER — ROPIVACAINE HCL 2 MG/ML IJ SOLN
2.0000 mL | Freq: Once | INTRAMUSCULAR | Status: AC
  Administered 2018-07-01: 10 mL via EPIDURAL

## 2018-07-01 MED ORDER — DEXAMETHASONE SODIUM PHOSPHATE 10 MG/ML IJ SOLN
INTRAMUSCULAR | Status: AC
  Filled 2018-07-01: qty 1

## 2018-07-01 MED ORDER — DEXAMETHASONE SODIUM PHOSPHATE 10 MG/ML IJ SOLN
10.0000 mg | Freq: Once | INTRAMUSCULAR | Status: AC
  Administered 2018-07-01: 10 mg

## 2018-07-01 MED ORDER — LIDOCAINE HCL 2 % IJ SOLN
INTRAMUSCULAR | Status: AC
  Filled 2018-07-01: qty 20

## 2018-07-01 MED ORDER — SODIUM CHLORIDE 0.9% FLUSH
2.0000 mL | Freq: Once | INTRAVENOUS | Status: AC
  Administered 2018-07-01: 2 mL

## 2018-07-01 MED ORDER — IOPAMIDOL (ISOVUE-M 200) INJECTION 41%
10.0000 mL | Freq: Once | INTRAMUSCULAR | Status: AC
  Administered 2018-07-01: 10 mL via EPIDURAL

## 2018-07-01 MED ORDER — IOPAMIDOL (ISOVUE-M 200) INJECTION 41%
INTRAMUSCULAR | Status: AC
  Filled 2018-07-01: qty 10

## 2018-07-01 MED ORDER — ROPIVACAINE HCL 2 MG/ML IJ SOLN
INTRAMUSCULAR | Status: AC
  Filled 2018-07-01: qty 10

## 2018-07-01 MED ORDER — LIDOCAINE HCL 2 % IJ SOLN
10.0000 mL | Freq: Once | INTRAMUSCULAR | Status: AC
  Administered 2018-07-01: 400 mg

## 2018-07-01 NOTE — Progress Notes (Signed)
Safety precautions to be maintained throughout the outpatient stay will include: orient to surroundings, keep bed in low position, maintain call bell within reach at all times, provide assistance with transfer out of bed and ambulation.  

## 2018-07-01 NOTE — Progress Notes (Signed)
Patient's Name: Jake Kirby  MRN: [id number]  Referring Provider: Gillis Santa, MD  DOB: 11-30-1965  PCP: Patient, No Pcp Per  DOS: 07/01/2018  Note by: Gillis Santa, MD  Service setting: Ambulatory outpatient  Specialty: Interventional Pain Management  Patient type: Established  Location: [facility] (AMB) Pain Management Facility  Visit type: Interventional Procedure   Primary Reason for Visit: Interventional Pain Management Treatment. CC: Back Pain (lower)  Procedure:          Anesthesia, Analgesia, Anxiolysis:  Type: Therapeutic Inter-Laminar Epidural Steroid Injection  #1  Region: Lumbar Level: L3-4 Level. Laterality: Right         Type: Moderate (Conscious) Sedation combined with Local Anesthesia Indication(s): Analgesia and Anxiety Route: Intravenous (IV) IV Access: Secured Sedation: Meaningful verbal contact was maintained at all times during the procedure  Local Anesthetic: Lidocaine 1-2%  Position: Prone with head of the table was raised to facilitate breathing.   Indications: 1. Lumbar radiculopathy    Pain Score: Pre-procedure: 8 /10 Post-procedure: 2 /10  Pre-op Assessment:  Jake Kirby is a 52 y.o. (year old), male patient, seen today for interventional treatment. He  has a past surgical history that includes Back surgery. Jake Kirby has a current medication list which includes the following prescription(s): aspirin ec, aspirin-caffeine, and ibuprofen. His primarily concern today is the Back Pain (lower)  Initial Vital Signs:  Pulse/HCG Rate: 73  Temp: 98.6 F (37 C) Resp: 16 BP: (!) 193/120 SpO2: 98 %  BMI: Estimated body mass index is 38.36 kg/m as calculated from the following:   Height as of this encounter: 5' 8.5" (1.74 m).   Weight as of this encounter: 256 lb (116.1 kg).  Risk Assessment: Allergies: Reviewed. He has No Known Allergies.  Allergy Precautions: None required Coagulopathies: Reviewed. None identified.  Blood-thinner therapy: None at this  time Active Infection(s): Reviewed. None identified. Jake Kirby is afebrile  Site Confirmation: Jake Kirby was asked to confirm the procedure and laterality before marking the site Procedure checklist: Completed Consent: Before the procedure and under the influence of no sedative(s), amnesic(s), or anxiolytics, the patient was informed of the treatment options, risks and possible complications. To fulfill our ethical and legal obligations, as recommended by the American Medical Association's Code of Ethics, I have informed the patient of my clinical impression; the nature and purpose of the treatment or procedure; the risks, benefits, and possible complications of the intervention; the alternatives, including doing nothing; the risk(s) and benefit(s) of the alternative treatment(s) or procedure(s); and the risk(s) and benefit(s) of doing nothing. The patient was provided information about the general risks and possible complications associated with the procedure. These may include, but are not limited to: failure to achieve desired goals, infection, bleeding, organ or nerve damage, allergic reactions, paralysis, and death. In addition, the patient was informed of those risks and complications associated to Spine-related procedures, such as failure to decrease pain; infection (i.e.: Meningitis, epidural or intraspinal abscess); bleeding (i.e.: epidural hematoma, subarachnoid hemorrhage, or any other type of intraspinal or peri-dural bleeding); organ or nerve damage (i.e.: Any type of peripheral nerve, nerve root, or spinal cord injury) with subsequent damage to sensory, motor, and/or autonomic systems, resulting in permanent pain, numbness, and/or weakness of one or several areas of the body; allergic reactions; (i.e.: anaphylactic reaction); and/or death. Furthermore, the patient was informed of those risks and complications associated with the medications. These include, but are not limited to: allergic  reactions (i.e.: anaphylactic or anaphylactoid reaction(s)); adrenal axis  suppression; blood sugar elevation that in diabetics may result in ketoacidosis or comma; water retention that in patients with history of congestive heart failure may result in shortness of breath, pulmonary edema, and decompensation with resultant heart failure; weight gain; swelling or edema; medication-induced neural toxicity; particulate matter embolism and blood vessel occlusion with resultant organ, and/or nervous system infarction; and/or aseptic necrosis of one or more joints. Finally, the patient was informed that Medicine is not an exact science; therefore, there is also the possibility of unforeseen or unpredictable risks and/or possible complications that may result in a catastrophic outcome. The patient indicated having understood very clearly. We have given the patient no guarantees and we have made no promises. Enough time was given to the patient to ask questions, all of which were answered to the patient's satisfaction. Jake Kirby has indicated that he wanted to continue with the procedure. Attestation: I, the ordering provider, attest that I have discussed with the patient the benefits, risks, side-effects, alternatives, likelihood of achieving goals, and potential problems during recovery for the procedure that I have provided informed consent. Date  Time: 07/01/2018  9:49 AM  Pre-Procedure Preparation:  Monitoring: As per clinic protocol. Respiration, ETCO2, SpO2, BP, heart rate and rhythm monitor placed and checked for adequate function Safety Precautions: Patient was assessed for positional comfort and pressure points before starting the procedure. Time-out: I initiated and conducted the "Time-out" before starting the procedure, as per protocol. The patient was asked to participate by confirming the accuracy of the "Time Out" information. Verification of the correct person, site, and procedure were performed and  confirmed by me, the nursing staff, and the patient. "Time-out" conducted as per Joint Commission's Universal Protocol (UP.01.01.01). Time: 1035  Description of Procedure:          Target Area: The interlaminar space, initially targeting the lower laminar border of the superior vertebral body. Approach: Paramedial approach. Area Prepped: Entire Posterior Lumbar Region Prepping solution: ChloraPrep (2% chlorhexidine gluconate and 70% isopropyl alcohol) Safety Precautions: Aspiration looking for blood return was conducted prior to all injections. At no point did we inject any substances, as a needle was being advanced. No attempts were made at seeking any paresthesias. Safe injection practices and needle disposal techniques used. Medications properly checked for expiration dates. SDV (single dose vial) medications used. Description of the Procedure: Protocol guidelines were followed. The procedure needle was introduced through the skin, ipsilateral to the reported pain, and advanced to the target area. Bone was contacted and the needle walked caudad, until the lamina was cleared. The epidural space was identified using "loss-of-resistance technique" with 2-3 ml of PF-NaCl (0.9% NSS), in a 5cc LOR glass syringe.  Vitals:   07/01/18 1023 07/01/18 1025 07/01/18 1035 07/01/18 1045  BP:  (!) 179/111 (!) 181/110 (!) 180/109  Pulse: 68 66 67 66  Resp:  17 16 16   Temp:      TempSrc:      SpO2:  95% 96% 97%  Weight:      Height:        Start Time: 1035 hrs. End Time: 1042 hrs.  Materials:  Needle(s) Type: Epidural needle Gauge: 17G Length: 3.5-in Medication(s): Please see orders for medications and dosing details. 8 cc solution consisting of 5 cc of preservative-free saline, 2 cc of 0.2% ropivacaine, 1 cc of Decadron 10 mg/cc. Imaging Guidance (Spinal):          Type of Imaging Technique: Fluoroscopy Guidance (Spinal) Indication(s): Assistance in needle guidance and placement  for procedures  requiring needle placement in or near specific anatomical locations not easily accessible without such assistance. Exposure Time: Please see nurses notes. Contrast: Before injecting any contrast, we confirmed that the patient did not have an allergy to iodine, shellfish, or radiological contrast. Once satisfactory needle placement was completed at the desired level, radiological contrast was injected. Contrast injected under live fluoroscopy. No contrast complications. See chart for type and volume of contrast used. Fluoroscopic Guidance: I was personally present during the use of fluoroscopy. "Tunnel Vision Technique" used to obtain the best possible view of the target area. Parallax error corrected before commencing the procedure. "Direction-depth-direction" technique used to introduce the needle under continuous pulsed fluoroscopy. Once target was reached, antero-posterior, oblique, and lateral fluoroscopic projection used confirm needle placement in all planes. Images permanently stored in EMR. Interpretation: I personally interpreted the imaging intraoperatively. Adequate needle placement confirmed in multiple planes. Appropriate spread of contrast into desired area was observed. No evidence of afferent or efferent intravascular uptake. No intrathecal or subarachnoid spread observed. Permanent images saved into the patient's record.  Antibiotic Prophylaxis:   Anti-infectives (From admission, onward)   None     Indication(s): None identified  Post-operative Assessment:  Post-procedure Vital Signs:  Pulse/HCG Rate: 66  Temp: 98.6 F (37 C) Resp: 16 BP: (!) 180/109 SpO2: 97 %  EBL: None  Complications: No immediate post-treatment complications observed by team, or reported by patient.  Note: The patient tolerated the entire procedure well. A repeat set of vitals were taken after the procedure and the patient was kept under observation following institutional policy, for this type of  procedure. Post-procedural neurological assessment was performed, showing return to baseline, prior to discharge. The patient was provided with post-procedure discharge instructions, including a section on how to identify potential problems. Should any problems arise concerning this procedure, the patient was given instructions to immediately contact us, at any time, without hesitation. In any case, we plan to contact the patient by telephone for a follow-up status report regarding this interventional procedure.  Comments:  No additional relevant information. 5 out of 5 strength bilateral lower extremity: Plantar flexion, dorsiflexion, knee flexion, knee extension.  Plan of Care   Imaging Orders     DG C-Arm 1-60 Min-No Report Procedure Orders    No procedure(s) ordered today    Medications ordered for procedure: Meds ordered this encounter  Medications  . iopamidol (ISOVUE-M) 41 % intrathecal injection 10 mL  . ropivacaine (PF) 2 mg/mL (0.2%) (NAROPIN) injection 2 mL  . sodium chloride flush (NS) 0.9 % injection 2 mL  . lidocaine (XYLOCAINE) 2 % (with pres) injection 200 mg  . dexamethasone (DECADRON) injection 10 mg   Medications administered: We administered iopamidol, ropivacaine (PF) 2 mg/mL (0.2%), sodium chloride flush, lidocaine, and dexamethasone.  See the medical record for exact dosing, route, and time of administration.  Disposition: Discharge home  Discharge Date & Time: 07/01/2018; 1051 hrs.   Physician-requested Follow-up: Return in about 4 weeks (around 07/29/2018) for Post Procedure Evaluation.  Future Appointments  Date Time Provider [place]  07/28/2018  8:45 AM Gillis Santa, MD [facility] None   Primary Care Physician: Patient, No Pcp Per Location: [facility] Note by: Gillis Santa, MD Date: 07/01/2018; Time: 12:42 PM  Disclaimer:  Medicine is not an exact science. The only guarantee in medicine is that nothing is  guaranteed. It is important to note that the decision to proceed with this intervention was based on the  information collected from the patient. The Data and conclusions were drawn from the patient's questionnaire, the interview, and the physical examination. Because the information was provided in large part by the patient, it cannot be guaranteed that it has not been purposely or unconsciously manipulated. Every effort has been made to obtain as much relevant data as possible for this evaluation. It is important to note that the conclusions that lead to this procedure are derived in large part from the available data. Always take into account that the treatment will also be dependent on availability of resources and existing treatment guidelines, considered by other Pain Management Practitioners as being common knowledge and practice, at the time of the intervention. For Medico-Legal purposes, it is also important to point out that variation in procedural techniques and pharmacological choices are the acceptable norm. The indications, contraindications, technique, and results of the above procedure should only be interpreted and judged by a Board-Certified Interventional Pain Specialist with extensive familiarity and expertise in the same exact procedure and technique.

## 2018-07-01 NOTE — Patient Instructions (Signed)
____________________________________________________________________________________________  Post-Procedure Discharge Instructions  Instructions:  Apply ice: Fill a plastic sandwich bag with crushed ice. Cover it with a small towel and apply to injection site. Apply for 15 minutes then remove x 15 minutes. Repeat sequence on day of procedure, until you go to bed. The purpose is to minimize swelling and discomfort after procedure.  Apply heat: Apply heat to procedure site starting the day following the procedure. The purpose is to treat any soreness and discomfort from the procedure.  Food intake: Start with clear liquids (like water) and advance to regular food, as tolerated.   Physical activities: Keep activities to a minimum for the first 8 hours after the procedure.   Driving: If you have received any sedation, you are not allowed to drive for 24 hours after your procedure.  Blood thinner: Restart your blood thinner 6 hours after your procedure. (Only for those taking blood thinners)  Insulin: As soon as you can eat, you may resume your normal dosing schedule. (Only for those taking insulin)  Infection prevention: Keep procedure site clean and dry.  Post-procedure Pain Diary: Extremely important that this be done correctly and accurately. Recorded information will be used to determine the next step in treatment.  Pain evaluated is that of treated area only. Do not include pain from an untreated area.  Complete every hour, on the hour, for the initial 8 hours. Set an alarm to help you do this part accurately.  Do not go to sleep and have it completed later. It will not be accurate.  Follow-up appointment: Keep your follow-up appointment after the procedure. Usually 2 weeks for most procedures. (6 weeks in the case of radiofrequency.) Bring you pain diary.   Expect:  From numbing medicine (AKA: Local Anesthetics): Numbness or decrease in pain.  Onset: Full effect within 15  minutes of injected.  Duration: It will depend on the type of local anesthetic used. On the average, 1 to 8 hours.   From steroids: Decrease in swelling or inflammation. Once inflammation is improved, relief of the pain will follow.  Onset of benefits: Depends on the amount of swelling present. The more swelling, the longer it will take for the benefits to be seen. In some cases, up to 10 days.  Duration: Steroids will stay in the system x 2 weeks. Duration of benefits will depend on multiple posibilities including persistent irritating factors.  Occasional side-effects: Facial flushing (red, warm cheeks) , cramps (if present, drink Gatorade and take over-the-counter Magnesium 450-500 mg once to twice a day).  From procedure: Some discomfort is to be expected once the numbing medicine wears off. This should be minimal if ice and heat are applied as instructed.  Call if:  You experience numbness and weakness that gets worse with time, as opposed to wearing off.  New onset bowel or bladder incontinence. (This applies to Spinal procedures only)  Emergency Numbers:  Durning business hours (Monday - Thursday, 8:00 AM - 4:00 PM) (Friday, 9:00 AM - 12:00 Noon): [phone]  After hours: [phone] ____________________________________________________________________________________________   Epidural Steroid Injection An epidural steroid injection is a shot of steroid medicine and numbing medicine that is given into the space between the spinal cord and the bones in your back (epidural space). The shot helps relieve pain caused by an irritated or swollen nerve root. The amount of pain relief you get from the injection depends on what is causing the nerve to be swollen and irritated, and how long your pain  lasts. You are more likely to benefit from this injection if your pain is strong and comes on suddenly rather than if you have had pain for a long time. Tell a health care provider  about:  Any allergies you have.  All medicines you are taking, including vitamins, herbs, eye drops, creams, and over-the-counter medicines.  Any problems you or family members have had with anesthetic medicines.  Any blood disorders you have.  Any surgeries you have had.  Any medical conditions you have.  Whether you are pregnant or may be pregnant. What are the risks? Generally, this is a safe procedure. However, problems may occur, including:  Headache.  Bleeding.  Infection.  Allergic reaction to medicines.  Damage to your nerves.  What happens before the procedure? Staying hydrated Follow instructions from your health care provider about hydration, which may include:  Up to 2 hours before the procedure - you may continue to drink clear liquids, such as water, clear fruit juice, black coffee, and plain tea.  Eating and drinking restrictions Follow instructions from your health care provider about eating and drinking, which may include:  8 hours before the procedure - stop eating heavy meals or foods such as meat, fried foods, or fatty foods.  6 hours before the procedure - stop eating light meals or foods, such as toast or cereal.  6 hours before the procedure - stop drinking milk or drinks that contain milk.  2 hours before the procedure - stop drinking clear liquids.  Medicine  You may be given medicines to lower anxiety.  Ask your health care provider about: ? Changing or stopping your regular medicines. This is especially important if you are taking diabetes medicines or blood thinners. ? Taking medicines such as aspirin and ibuprofen. These medicines can thin your blood. Do not take these medicines before your procedure if your health care provider instructs you not to. General instructions  Plan to have someone take you home from the hospital or clinic. What happens during the procedure?  You may receive a medicine to help you relax  (sedative).  You will be asked to lie on your abdomen.  The injection site will be cleaned.  A numbing medicine (local anesthetic) will be used to numb the injection site.  A needle will be inserted through your skin into the epidural space. You may feel some discomfort when this happens. An X-ray machine will be used to make sure the needle is put as close as possible to the affected nerve.  A steroid medicine and a local anesthetic will be injected into the epidural space.  The needle will be removed.  A bandage (dressing) will be put over the injection site. What happens after the procedure?  Your blood pressure, heart rate, breathing rate, and blood oxygen level will be monitored until the medicines you were given have worn off.  Your arm or leg may feel weak or numb for a few hours.  The injection site may feel sore.  Do not drive for 24 hours if you received a sedative. This information is not intended to replace advice given to you by your health care provider. Make sure you discuss any questions you have with your health care provider. Document Released: 10/15/2007 Document Revised: 12/20/2015 Document Reviewed: 10/24/2015 Elsevier Interactive Patient Education  [employer].

## 2018-07-02 ENCOUNTER — Telehealth: Payer: Self-pay | Admitting: *Deleted

## 2018-07-02 NOTE — Telephone Encounter (Signed)
Attempted to call for post procedure follow-up. Message left. 

## 2018-07-24 DIAGNOSIS — I119 Hypertensive heart disease without heart failure: Secondary | ICD-10-CM | POA: Diagnosis not present

## 2018-07-24 DIAGNOSIS — E8881 Metabolic syndrome: Secondary | ICD-10-CM | POA: Diagnosis not present

## 2018-07-28 ENCOUNTER — Ambulatory Visit
Payer: Commercial Managed Care - PPO | Attending: Student in an Organized Health Care Education/Training Program | Admitting: Student in an Organized Health Care Education/Training Program

## 2018-07-28 ENCOUNTER — Other Ambulatory Visit: Payer: Self-pay

## 2018-07-28 ENCOUNTER — Encounter: Payer: Self-pay | Admitting: Student in an Organized Health Care Education/Training Program

## 2018-07-28 VITALS — BP 170/106 | HR 73 | Temp 98.3°F | Resp 18 | Ht 68.5 in | Wt 249.0 lb

## 2018-07-28 DIAGNOSIS — M5416 Radiculopathy, lumbar region: Secondary | ICD-10-CM

## 2018-07-28 DIAGNOSIS — G894 Chronic pain syndrome: Secondary | ICD-10-CM

## 2018-07-28 DIAGNOSIS — Z981 Arthrodesis status: Secondary | ICD-10-CM | POA: Diagnosis not present

## 2018-07-28 NOTE — Progress Notes (Signed)
Patient's Name: Jake Kirby  MRN: [id number]  Referring Provider: No ref. provider found  DOB: May 31, 1966  PCP: Patient, No Pcp Per  DOS: 07/28/2018  Note by: Gillis Santa, MD  Service setting: Ambulatory outpatient  Specialty: Interventional Pain Management  Location: [facility] (AMB) Pain Management Facility    Patient type: Established   Primary Reason(s) for Visit: Encounter for post-procedure evaluation of chronic illness with mild to moderate exacerbation CC: Back Pain (lower)  HPI  [name] is a 53 y.o. year old, male patient, who comes today for a post-procedure evaluation. He has History of lumbar fusion (L5-S1); Lumbar radiculopathy; Lumbar facet arthropathy; Chronic bilateral low back pain with bilateral sciatica; and Chronic pain syndrome on their problem list. His primarily concern today is the Back Pain (lower)  Pain Assessment: Location: Right, Left, Lower Back Radiating: through hips down both legs; to foot on left side and to ankle on right side Onset: More than a month ago Duration: Chronic pain Quality: Constant, Aching, Burning, Throbbing, Sharp, Shooting Severity: 9 /10 (subjective, self-reported pain score)  Note: Reported level is inconsistent with clinical observations.                         When using our objective Pain Scale, levels between 6 and 10/10 are said to belong in an emergency room, as it progressively worsens from a 6/10, described as severely limiting, requiring emergency care not usually available at an outpatient pain management facility. At a 6/10 level, communication becomes difficult and requires great effort. Assistance to reach the emergency department may be required. Facial flushing and profuse sweating along with potentially dangerous increases in heart rate and blood pressure will be evident. Effect on ADL: "it's a struggle every day at work" Timing: Constant Modifying factors: ibuprophen helps when he takes it - states he is trying to decrease  use BP: (!) 170/106  HR: 73  [name] comes in today for post-procedure evaluation.  Further details on both, my assessment(s), as well as the proposed treatment plan, please see below.  Post-Procedure Assessment  07/01/2018 Procedure: R L3/4 ESI Pre-procedure pain score:  8/10 Post-procedure pain score: 2/10         Influential Factors: BMI: 37.31 kg/m Intra-procedural challenges: None observed.         Assessment challenges: None detected.              Reported side-effects: None.        Post-procedural adverse reactions or complications: None reported         Sedation: Please see nurses note. When no sedatives are used, the analgesic levels obtained are directly associated to the effectiveness of the local anesthetics. However, when sedation is provided, the level of analgesia obtained during the initial 1 hour following the intervention, is believed to be the result of a combination of factors. These factors may include, but are not limited to: 1. The effectiveness of the local anesthetics used. 2. The effects of the analgesic(s) and/or anxiolytic(s) used. 3. The degree of discomfort experienced by the patient at the time of the procedure. 4. The patients ability and reliability in recalling and recording the events. 5. The presence and influence of possible secondary gains and/or psychosocial factors. Reported result: Relief experienced during the 1st hour after the procedure: 100 % (Ultra-Short Term Relief)            Interpretative annotation: Clinically appropriate result. Analgesia during this period is likely to  be Local Anesthetic and/or IV Sedative (Analgesic/Anxiolytic) related.          Effects of local anesthetic: The analgesic effects attained during this period are directly associated to the localized infiltration of local anesthetics and therefore cary significant diagnostic value as to the etiological location, or anatomical origin, of the pain. Expected duration of  relief is directly dependent on the pharmacodynamics of the local anesthetic used. Long-acting (4-6 hours) anesthetics used.  Reported result: Relief during the next 4 to 6 hour after the procedure: 100 % (Short-Term Relief)            Interpretative annotation: Clinically appropriate result. Analgesia during this period is likely to be Local Anesthetic-related.          Long-term benefit: Defined as the period of time past the expected duration of local anesthetics (1 hour for short-acting and 4-6 hours for long-acting). With the possible exception of prolonged sympathetic blockade from the local anesthetics, benefits during this period are typically attributed to, or associated with, other factors such as analgesic sensory neuropraxia, antiinflammatory effects, or beneficial biochemical changes provided by agents other than the local anesthetics.  Reported result: Extended relief following procedure: 100 %(X 1.5 days then "back to where I was before the procedure") (Long-Term Relief)            Interpretative annotation: Clinically possible results. Good relief. No permanent benefit expected. Inflammation plays a part in the etiology to the pain.          Current benefits: Defined as reported results that persistent at this point in time.   Analgesia: 0-25 %            Function: Back to baseline ROM: Back to baseline Interpretative annotation: Partial relief. Limited therapeutic benefit. Results would suggest further treatment needed.          Interpretation: Results would suggest that repeating the procedure may be necessary,                  Plan:  Repeat treatment or therapy and compare extent and duration of benefits.                Laboratory Chemistry  Inflammation Markers (CRP: Acute Phase) (ESR: Chronic Phase) No results found for: CRP, ESRSEDRATE, LATICACIDVEN                       Rheumatology Markers No results found for: RF, ANA, LABURIC, URICUR, LYMEIGGIGMAB, LYMEABIGMQN,  HLAB27                      Renal Function Markers Lab Results  Component Value Date   BUN 16 04/01/2015   CREATININE 0.92 04/01/2015   GFRAA >60 04/01/2015   GFRNONAA >60 04/01/2015                             Hepatic Function Markers Lab Results  Component Value Date   AST 18 04/01/2015   ALT 22 04/01/2015   ALBUMIN 4.0 04/01/2015   ALKPHOS 68 04/01/2015   LIPASE 25 04/01/2015                        Electrolytes Lab Results  Component Value Date   NA 142 04/01/2015   K 3.7 04/01/2015   CL 105 04/01/2015   CALCIUM 9.0 04/01/2015  Neuropathy Markers No results found for: VITAMINB12, FOLATE, HGBA1C, HIV                      CNS Tests No results found for: COLORCSF, APPEARCSF, RBCCOUNTCSF, WBCCSF, POLYSCSF, LYMPHSCSF, EOSCSF, PROTEINCSF, GLUCCSF, JCVIRUS, CSFOLI, IGGCSF                      Bone Pathology Markers No results found for: VD25OH, [id number], YO3785YI5, OY7741OI7, 25OHVITD1, 25OHVITD2, 25OHVITD3, TESTOFREE, TESTOSTERONE                       Coagulation Parameters Lab Results  Component Value Date   PLT 317 04/01/2015                        Cardiovascular Markers Lab Results  Component Value Date   HGB 13.6 04/01/2015   HCT 42.2 04/01/2015                         CA Markers No results found for: CEA, CA125, LABCA2                      Note: Lab results reviewed.  Recent Diagnostic Imaging Results  DG C-Arm 1-60 Min-No Report Fluoroscopy was utilized by the requesting physician.  No radiographic  interpretation.   Complexity Note: Imaging results reviewed. Results shared with Mr. Merritts, using Layman's terms.                         Meds   Current Outpatient Medications:  .  aspirin EC 81 MG tablet, Take 81 mg by mouth daily., Disp: , Rfl:  .  Aspirin-Caffeine (BC FAST PAIN RELIEF ARTHRITIS) 1000-65 MG PACK, Take by mouth., Disp: , Rfl:  .  ibuprofen (ADVIL,MOTRIN) 800 MG tablet, Take 800 mg by mouth every 8 (eight)  hours as needed., Disp: , Rfl:   ROS  Constitutional: Denies any fever or chills Gastrointestinal: No reported hemesis, hematochezia, vomiting, or acute GI distress Musculoskeletal: Denies any acute onset joint swelling, redness, loss of ROM, or weakness Neurological: No reported episodes of acute onset apraxia, aphasia, dysarthria, agnosia, amnesia, paralysis, loss of coordination, or loss of consciousness  Allergies  Mr. Settle has No Known Allergies.  PFSH  Drug: Mr. Asencio  reports no history of drug use. Alcohol:  reports current alcohol use of about 5.0 standard drinks of alcohol per week. Tobacco:  reports that he has quit smoking. His smoking use included cigarettes. He has never used smokeless tobacco. Medical:  has a past medical history of Hypertension. Surgical: Mr. Wilczynski  has a past surgical history that includes Back surgery. Family: family history is not on file.  Constitutional Exam  General appearance: Well nourished, well developed, and well hydrated. In no apparent acute distress Vitals:   07/28/18 0840  BP: (!) 170/106  Pulse: 73  Resp: 18  Temp: 98.3 F (36.8 C)  TempSrc: Oral  SpO2: 97%  Weight: 249 lb (112.9 kg)  Height: 5' 8.5" (1.74 m)   BMI Assessment: Estimated body mass index is 37.31 kg/m as calculated from the following:   Height as of this encounter: 5' 8.5" (1.74 m).   Weight as of this encounter: 249 lb (112.9 kg).  BMI interpretation table: BMI level Category Range association with higher incidence of chronic pain  <18 kg/m2 Underweight  18.5-24.9 kg/m2 Ideal body weight   25-29.9 kg/m2 Overweight Increased incidence by 20%  30-34.9 kg/m2 Obese (Class I) Increased incidence by 68%  35-39.9 kg/m2 Severe obesity (Class II) Increased incidence by 136%  >40 kg/m2 Extreme obesity (Class III) Increased incidence by 254%   Patient's current BMI Ideal Body weight  Body mass index is 37.31 kg/m. Ideal body weight: 69.6 kg (153 lb 5.3  oz) Adjusted ideal body weight: 86.9 kg (191 lb 9.6 oz)   BMI Readings from Last 4 Encounters:  07/28/18 37.31 kg/m  07/01/18 38.36 kg/m  06/16/18 38.87 kg/m  05/10/18 38.01 kg/m   Wt Readings from Last 4 Encounters:  07/28/18 249 lb (112.9 kg)  07/01/18 256 lb (116.1 kg)  06/16/18 259 lb 6.4 oz (117.7 kg)  05/10/18 250 lb (113.4 kg)  Psych/Mental status: Alert, oriented x 3 (person, place, & time)       Eyes: PERLA Respiratory: No evidence of acute respiratory distress  Cervical Spine Area Exam  Skin & Axial Inspection: No masses, redness, edema, swelling, or associated skin lesions Alignment: Symmetrical Functional ROM: Unrestricted ROM      Stability: No instability detected Muscle Tone/Strength: Functionally intact. No obvious neuro-muscular anomalies detected. Sensory (Neurological): Unimpaired Palpation: No palpable anomalies              Upper Extremity (UE) Exam    Side: Right upper extremity  Side: Left upper extremity  Skin & Extremity Inspection: Skin color, temperature, and hair growth are WNL. No peripheral edema or cyanosis. No masses, redness, swelling, asymmetry, or associated skin lesions. No contractures.  Skin & Extremity Inspection: Skin color, temperature, and hair growth are WNL. No peripheral edema or cyanosis. No masses, redness, swelling, asymmetry, or associated skin lesions. No contractures.  Functional ROM: Unrestricted ROM          Functional ROM: Unrestricted ROM          Muscle Tone/Strength: Functionally intact. No obvious neuro-muscular anomalies detected.  Muscle Tone/Strength: Functionally intact. No obvious neuro-muscular anomalies detected.  Sensory (Neurological): Unimpaired          Sensory (Neurological): Unimpaired          Palpation: No palpable anomalies              Palpation: No palpable anomalies              Provocative Test(s):  Phalen's test: deferred Tinel's test: deferred Apley's scratch test (touch opposite shoulder):   Action 1 (Across chest): deferred Action 2 (Overhead): deferred Action 3 (LB reach): deferred   Provocative Test(s):  Phalen's test: deferred Tinel's test: deferred Apley's scratch test (touch opposite shoulder):  Action 1 (Across chest): deferred Action 2 (Overhead): deferred Action 3 (LB reach): deferred    Thoracic Spine Area Exam  Skin & Axial Inspection: No masses, redness, or swelling Alignment: Symmetrical Functional ROM: Unrestricted ROM Stability: No instability detected Muscle Tone/Strength: Functionally intact. No obvious neuro-muscular anomalies detected. Sensory (Neurological): Unimpaired Muscle strength & Tone: No palpable anomalies  Lumbar Spine Area Exam  Skin & Axial Inspection: No masses, redness, or swelling Alignment: Symmetrical Functional ROM: Decreased ROM affecting primarily the right Stability: No instability detected Muscle Tone/Strength: Functionally intact. No obvious neuro-muscular anomalies detected. Sensory (Neurological): Dermatomal pain pattern Palpation: No palpable anomalies       Provocative Tests: Hyperextension/rotation test: deferred today       Lumbar quadrant test (Kemp's test): deferred today       Lateral bending test: (+) ipsilateral radicular  pain, on the right. Positive for right-sided foraminal stenosis. Patrick's Maneuver: deferred today                   FABER* test: deferred today                   S-I anterior distraction/compression test: deferred today         S-I lateral compression test: deferred today         S-I Thigh-thrust test: deferred today         S-I Gaenslen's test: deferred today         *(Flexion, ABduction and External Rotation)  Gait & Posture Assessment  Ambulation: Unassisted Gait: Relatively normal for age and body habitus Posture: WNL   Lower Extremity Exam    Side: Right lower extremity  Side: Left lower extremity  Stability: No instability observed          Stability: No instability observed           Skin & Extremity Inspection: Skin color, temperature, and hair growth are WNL. No peripheral edema or cyanosis. No masses, redness, swelling, asymmetry, or associated skin lesions. No contractures.  Skin & Extremity Inspection: Skin color, temperature, and hair growth are WNL. No peripheral edema or cyanosis. No masses, redness, swelling, asymmetry, or associated skin lesions. No contractures.  Functional ROM: Pain restricted ROM for hip joint          Functional ROM: Unrestricted ROM                  Muscle Tone/Strength: Functionally intact. No obvious neuro-muscular anomalies detected.  Muscle Tone/Strength: Functionally intact. No obvious neuro-muscular anomalies detected.  Sensory (Neurological): Dermatomal pain pattern top of foot (L5) & S1  Sensory (Neurological): Unimpaired        DTR: Patellar: deferred today Achilles: deferred today Plantar: deferred today  DTR: Patellar: deferred today Achilles: deferred today Plantar: deferred today  Palpation: No palpable anomalies  Palpation: No palpable anomalies   Assessment  Primary Diagnosis & Pertinent Problem List: The primary encounter diagnosis was Lumbar radiculopathy. Diagnoses of History of lumbar fusion (L5-S1) and Chronic pain syndrome were also pertinent to this visit.  Status Diagnosis  Persistent Persistent Persistent 1. Lumbar radiculopathy   2. History of lumbar fusion (L5-S1)   3. Chronic pain syndrome      Plan: Repeat L-ESI #2- attempt R L4/5 (above L5/S1 fusion), if LF compromised, consider L3/4 with downward bevel.  Plan of Care   Lab-work, procedure(s), and/or referral(s): Orders Placed This Encounter  Procedures  . Lumbar Epidural Injection    Time Note: Greater than 50% of the 25 minute(s) of face-to-face time spent with Mr. Tremont, was spent in counseling/coordination of care regarding: Mr. Weisensel primary cause of pain, the treatment plan, treatment alternatives, the risks and possible  complications of proposed treatment, the results, interpretation and significance of  his recent diagnostic interventional treatment(s) and realistic expectations.  Provider-requested follow-up: Return in about 1 week (around 08/04/2018) for Procedure.  No future appointments.  Primary Care Physician: Patient, No Pcp Per Location: [facility] Note by: Gillis Santa, M.D Date: 07/28/2018; Time: 10:08 AM  Patient Instructions  ____________________________________________________________________________________________  General Risks and Possible Complications  Patient Responsibilities: It is important that you read this as it is part of your informed consent. It is our duty to inform you of the risks and possible complications associated with treatments offered to you. It is your responsibility as  a patient to read this and to ask questions about anything that is not clear or that you believe was not covered in this document.  Patient's Rights: You have the right to refuse treatment. You also have the right to change your mind, even after initially having agreed to have the treatment done. However, under this last option, if you wait until the last second to change your mind, you may be charged for the materials used up to that point.  Introduction: Medicine is not an Chief Strategy Officer. Everything in Medicine, including the lack of treatment(s), carries the potential for danger, harm, or loss (which is by definition: Risk). In Medicine, a complication is a secondary problem, condition, or disease that can aggravate an already existing one. All treatments carry the risk of possible complications. The fact that a side effects or complications occurs, does not imply that the treatment was conducted incorrectly. It must be clearly understood that these can happen even when everything is done following the highest safety standards.  No treatment: You can choose not to proceed with  the proposed treatment alternative. The "PRO(s)" would include: avoiding the risk of complications associated with the therapy. The "CON(s)" would include: not getting any of the treatment benefits. These benefits fall under one of three categories: diagnostic; therapeutic; and/or palliative. Diagnostic benefits include: getting information which can ultimately lead to improvement of the disease or symptom(s). Therapeutic benefits are those associated with the successful treatment of the disease. Finally, palliative benefits are those related to the decrease of the primary symptoms, without necessarily curing the condition (example: decreasing the pain from a flare-up of a chronic condition, such as incurable terminal cancer).  General Risks and Complications: These are associated to most interventional treatments. They can occur alone, or in combination. They fall under one of the following six (6) categories: no benefit or worsening of symptoms; bleeding; infection; nerve damage; allergic reactions; and/or death. 1. No benefits or worsening of symptoms: In Medicine there are no guarantees, only probabilities. No healthcare provider can ever guarantee that a medical treatment will work, they can only state the probability that it may. Furthermore, there is always the possibility that the condition may worsen, either directly, or indirectly, as a consequence of the treatment. 2. Bleeding: This is more common if the patient is taking a blood thinner, either prescription or over the counter (example: Goody Powders, Fish oil, Aspirin, Garlic, etc.), or if suffering a condition associated with impaired coagulation (example: Hemophilia, cirrhosis of the liver, low platelet counts, etc.). However, even if you do not have one on these, it can still happen. If you have any of these conditions, or take one of these drugs, make sure to notify your treating physician. 3. Infection: This is more common in patients with a  compromised immune system, either due to disease (example: diabetes, cancer, human immunodeficiency virus [HIV], etc.), or due to medications or treatments (example: therapies used to treat cancer and rheumatological diseases). However, even if you do not have one on these, it can still happen. If you have any of these conditions, or take one of these drugs, make sure to notify your treating physician. 4. Nerve Damage: This is more common when the treatment is an invasive one, but it can also happen with the use of medications, such as those used in the treatment of cancer. The damage can occur to small secondary nerves, or to large primary ones, such as those in the spinal cord and brain. This damage  may be temporary or permanent and it may lead to impairments that can range from temporary numbness to permanent paralysis and/or brain death. 5. Allergic Reactions: Any time a substance or material comes in contact with our body, there is the possibility of an allergic reaction. These can range from a mild skin rash (contact dermatitis) to a severe systemic reaction (anaphylactic reaction), which can result in death. 6. Death: In general, any medical intervention can result in death, most of the time due to an unforeseen complication. ____________________________________________________________________________________________  Epidural Steroid Injection Patient Information  Description: The epidural space surrounds the nerves as they exit the spinal cord.  In some patients, the nerves can be compressed and inflamed by a bulging disc or a tight spinal canal (spinal stenosis).  By injecting steroids into the epidural space, we can bring irritated nerves into direct contact with a potentially helpful medication.  These steroids act directly on the irritated nerves and can reduce swelling and inflammation which often leads to decreased pain.  Epidural steroids may be injected anywhere along the spine and from the  neck to the low back depending upon the location of your pain.   After numbing the skin with local anesthetic (like Novocaine), a small needle is passed into the epidural space slowly.  You may experience a sensation of pressure while this is being done.  The entire block usually last less than 10 minutes.  Conditions which may be treated by epidural steroids:   Low back and leg pain  Neck and arm pain  Spinal stenosis  Post-laminectomy syndrome  Herpes zoster (shingles) pain  Pain from compression fractures  Preparation for the injection:  1. Do not eat any solid food or dairy products within 8 hours of your appointment.  2. You may drink clear liquids up to 3 hours before appointment.  Clear liquids include water, black coffee, juice or soda.  No milk or cream please. 3. You may take your regular medication, including pain medications, with a sip of water before your appointment  Diabetics should hold regular insulin (if taken separately) and take 1/2 normal NPH dos the morning of the procedure.  Carry some sugar containing items with you to your appointment. 4. A driver must accompany you and be prepared to drive you home after your procedure.  5. Bring all your current medications with your. 6. An IV may be inserted and sedation may be given at the discretion of the physician.   7. A blood pressure cuff, EKG and other monitors will often be applied during the procedure.  Some patients may need to have extra oxygen administered for a short period. 8. You will be asked to provide medical information, including your allergies, prior to the procedure.  We must know immediately if you are taking blood thinners (like Coumadin/Warfarin)  Or if you are allergic to IV iodine contrast (dye). We must know if you could possible be pregnant.  Possible side-effects:  Bleeding from needle site  Infection (rare, may require surgery)  Nerve injury (rare)  Numbness & tingling  (temporary)  Difficulty urinating (rare, temporary)  Spinal headache ( a headache worse with upright posture)  Light -headedness (temporary)  Pain at injection site (several days)  Decreased blood pressure (temporary)  Weakness in arm/leg (temporary)  Pressure sensation in back/neck (temporary)  Call if you experience:  Fever/chills associated with headache or increased back/neck pain.  Headache worsened by an upright position.  New onset weakness or numbness of an extremity below  the injection site  Hives or difficulty breathing (go to the emergency room)  Inflammation or drainage at the infection site  Severe back/neck pain  Any new symptoms which are concerning to you  Please note:  Although the local anesthetic injected can often make your back or neck feel good for several hours after the injection, the pain will likely return.  It takes 3-7 days for steroids to work in the epidural space.  You may not notice any pain relief for at least that one week.  If effective, we will often do a series of three injections spaced 3-6 weeks apart to maximally decrease your pain.  After the initial series, we generally will wait several months before considering a repeat injection of the same type.  If you have any questions, please call [phone] [facility]

## 2018-07-28 NOTE — Patient Instructions (Signed)
____________________________________________________________________________________________  General Risks and Possible Complications  Patient Responsibilities: It is important that you read this as it is part of your informed consent. It is our duty to inform you of the risks and possible complications associated with treatments offered to you. It is your responsibility as a patient to read this and to ask questions about anything that is not clear or that you believe was not covered in this document.  Patient's Rights: You have the right to refuse treatment. You also have the right to change your mind, even after initially having agreed to have the treatment done. However, under this last option, if you wait until the last second to change your mind, you may be charged for the materials used up to that point.  Introduction: Medicine is not an exact science. Everything in Medicine, including the lack of treatment(s), carries the potential for danger, harm, or loss (which is by definition: Risk). In Medicine, a complication is a secondary problem, condition, or disease that can aggravate an already existing one. All treatments carry the risk of possible complications. The fact that a side effects or complications occurs, does not imply that the treatment was conducted incorrectly. It must be clearly understood that these can happen even when everything is done following the highest safety standards.  No treatment: You can choose not to proceed with the proposed treatment alternative. The "PRO(s)" would include: avoiding the risk of complications associated with the therapy. The "CON(s)" would include: not getting any of the treatment benefits. These benefits fall under one of three categories: diagnostic; therapeutic; and/or palliative. Diagnostic benefits include: getting information which can ultimately lead to improvement of the disease or symptom(s). Therapeutic benefits are those associated with the  successful treatment of the disease. Finally, palliative benefits are those related to the decrease of the primary symptoms, without necessarily curing the condition (example: decreasing the pain from a flare-up of a chronic condition, such as incurable terminal cancer).  General Risks and Complications: These are associated to most interventional treatments. They can occur alone, or in combination. They fall under one of the following six (6) categories: no benefit or worsening of symptoms; bleeding; infection; nerve damage; allergic reactions; and/or death. 1. No benefits or worsening of symptoms: In Medicine there are no guarantees, only probabilities. No healthcare provider can ever guarantee that a medical treatment will work, they can only state the probability that it may. Furthermore, there is always the possibility that the condition may worsen, either directly, or indirectly, as a consequence of the treatment. 2. Bleeding: This is more common if the patient is taking a blood thinner, either prescription or over the counter (example: Goody Powders, Fish oil, Aspirin, Garlic, etc.), or if suffering a condition associated with impaired coagulation (example: Hemophilia, cirrhosis of the liver, low platelet counts, etc.). However, even if you do not have one on these, it can still happen. If you have any of these conditions, or take one of these drugs, make sure to notify your treating physician. 3. Infection: This is more common in patients with a compromised immune system, either due to disease (example: diabetes, cancer, human immunodeficiency virus [HIV], etc.), or due to medications or treatments (example: therapies used to treat cancer and rheumatological diseases). However, even if you do not have one on these, it can still happen. If you have any of these conditions, or take one of these drugs, make sure to notify your treating physician. 4. Nerve Damage: This is more common when the   treatment is  an invasive one, but it can also happen with the use of medications, such as those used in the treatment of cancer. The damage can occur to small secondary nerves, or to large primary ones, such as those in the spinal cord and brain. This damage may be temporary or permanent and it may lead to impairments that can range from temporary numbness to permanent paralysis and/or brain death. 5. Allergic Reactions: Any time a substance or material comes in contact with our body, there is the possibility of an allergic reaction. These can range from a mild skin rash (contact dermatitis) to a severe systemic reaction (anaphylactic reaction), which can result in death. 6. Death: In general, any medical intervention can result in death, most of the time due to an unforeseen complication. ____________________________________________________________________________________________  Epidural Steroid Injection Patient Information  Description: The epidural space surrounds the nerves as they exit the spinal cord.  In some patients, the nerves can be compressed and inflamed by a bulging disc or a tight spinal canal (spinal stenosis).  By injecting steroids into the epidural space, we can bring irritated nerves into direct contact with a potentially helpful medication.  These steroids act directly on the irritated nerves and can reduce swelling and inflammation which often leads to decreased pain.  Epidural steroids may be injected anywhere along the spine and from the neck to the low back depending upon the location of your pain.   After numbing the skin with local anesthetic (like Novocaine), a small needle is passed into the epidural space slowly.  You may experience a sensation of pressure while this is being done.  The entire block usually last less than 10 minutes.  Conditions which may be treated by epidural steroids:   Low back and leg pain  Neck and arm pain  Spinal stenosis  Post-laminectomy  syndrome  Herpes zoster (shingles) pain  Pain from compression fractures  Preparation for the injection:  1. Do not eat any solid food or dairy products within 8 hours of your appointment.  2. You may drink clear liquids up to 3 hours before appointment.  Clear liquids include water, black coffee, juice or soda.  No milk or cream please. 3. You may take your regular medication, including pain medications, with a sip of water before your appointment  Diabetics should hold regular insulin (if taken separately) and take 1/2 normal NPH dos the morning of the procedure.  Carry some sugar containing items with you to your appointment. 4. A driver must accompany you and be prepared to drive you home after your procedure.  5. Bring all your current medications with your. 6. An IV may be inserted and sedation may be given at the discretion of the physician.   7. A blood pressure cuff, EKG and other monitors will often be applied during the procedure.  Some patients may need to have extra oxygen administered for a short period. 8. You will be asked to provide medical information, including your allergies, prior to the procedure.  We must know immediately if you are taking blood thinners (like Coumadin/Warfarin)  Or if you are allergic to IV iodine contrast (dye). We must know if you could possible be pregnant.  Possible side-effects:  Bleeding from needle site  Infection (rare, may require surgery)  Nerve injury (rare)  Numbness & tingling (temporary)  Difficulty urinating (rare, temporary)  Spinal headache ( a headache worse with upright posture)  Light -headedness (temporary)  Pain at injection site (several   days)  Decreased blood pressure (temporary)  Weakness in arm/leg (temporary)  Pressure sensation in back/neck (temporary)  Call if you experience:  Fever/chills associated with headache or increased back/neck pain.  Headache worsened by an upright position.  New onset  weakness or numbness of an extremity below the injection site  Hives or difficulty breathing (go to the emergency room)  Inflammation or drainage at the infection site  Severe back/neck pain  Any new symptoms which are concerning to you  Please note:  Although the local anesthetic injected can often make your back or neck feel good for several hours after the injection, the pain will likely return.  It takes 3-7 days for steroids to work in the epidural space.  You may not notice any pain relief for at least that one week.  If effective, we will often do a series of three injections spaced 3-6 weeks apart to maximally decrease your pain.  After the initial series, we generally will wait several months before considering a repeat injection of the same type.  If you have any questions, please call [phone] [facility] 

## 2018-07-28 NOTE — Progress Notes (Signed)
Safety precautions to be maintained throughout the outpatient stay will include: orient to surroundings, keep bed in low position, maintain call bell within reach at all times, provide assistance with transfer out of bed and ambulation.  

## 2018-07-31 DIAGNOSIS — I119 Hypertensive heart disease without heart failure: Secondary | ICD-10-CM | POA: Diagnosis not present

## 2018-07-31 DIAGNOSIS — E8881 Metabolic syndrome: Secondary | ICD-10-CM | POA: Diagnosis not present

## 2018-08-05 ENCOUNTER — Ambulatory Visit (HOSPITAL_BASED_OUTPATIENT_CLINIC_OR_DEPARTMENT_OTHER): Payer: Commercial Managed Care - PPO | Admitting: Student in an Organized Health Care Education/Training Program

## 2018-08-05 ENCOUNTER — Ambulatory Visit
Admission: RE | Admit: 2018-08-05 | Discharge: 2018-08-05 | Disposition: A | Discharge status: Home / Self Care | Payer: Commercial Managed Care - PPO | Source: Ambulatory Visit | Attending: Student in an Organized Health Care Education/Training Program | Admitting: Student in an Organized Health Care Education/Training Program

## 2018-08-05 ENCOUNTER — Encounter: Payer: Self-pay | Admitting: Student in an Organized Health Care Education/Training Program

## 2018-08-05 DIAGNOSIS — M5416 Radiculopathy, lumbar region: Secondary | ICD-10-CM

## 2018-08-05 MED ORDER — IOPAMIDOL (ISOVUE-M 200) INJECTION 41%
10.0000 mL | Freq: Once | INTRAMUSCULAR | Status: AC
  Administered 2018-08-05: 10 mL via EPIDURAL
  Filled 2018-08-05: qty 10

## 2018-08-05 MED ORDER — DEXAMETHASONE SODIUM PHOSPHATE 10 MG/ML IJ SOLN
10.0000 mg | Freq: Once | INTRAMUSCULAR | Status: AC
  Administered 2018-08-05: 10 mg
  Filled 2018-08-05: qty 1

## 2018-08-05 MED ORDER — ROPIVACAINE HCL 2 MG/ML IJ SOLN
2.0000 mL | Freq: Once | INTRAMUSCULAR | Status: AC
  Administered 2018-08-05: 2 mL via EPIDURAL
  Filled 2018-08-05: qty 10

## 2018-08-05 MED ORDER — LIDOCAINE HCL 2 % IJ SOLN
10.0000 mL | Freq: Once | INTRAMUSCULAR | Status: AC
  Administered 2018-08-05: 400 mg
  Filled 2018-08-05: qty 40

## 2018-08-05 MED ORDER — SODIUM CHLORIDE 0.9% FLUSH
2.0000 mL | Freq: Once | INTRAVENOUS | Status: AC
  Administered 2018-08-05: 2 mL

## 2018-08-05 NOTE — Patient Instructions (Signed)
Pain Management Discharge Instructions  General Discharge Instructions :  If you need to reach your doctor call: Monday-Friday 8:00 am - 4:00 pm at [phone] or toll free [phone].  After clinic hours [phone] to have operator reach doctor.  Bring all of your medication bottles to all your appointments in the pain clinic.  To cancel or reschedule your appointment with Pain Management please remember to call 24 hours in advance to avoid a fee.  Refer to the educational materials which you have been given on: General Risks, I had my Procedure. Discharge Instructions, Post Sedation.  Post Procedure Instructions:  The drugs you were given will stay in your system until tomorrow, so for the next 24 hours you should not drive, make any legal decisions or drink any alcoholic beverages.  You may eat anything you prefer, but it is better to start with liquids then soups and crackers, and gradually work up to solid foods.  Please notify your doctor immediately if you have any unusual bleeding, trouble breathing or pain that is not related to your normal pain.  Depending on the type of procedure that was done, some parts of your body may feel week and/or numb.  This usually clears up by tonight or the next day.  Walk with the use of an assistive device or accompanied by an adult for the 24 hours.  You may use ice on the affected area for the first 24 hours.  Put ice in a Ziploc bag and cover with a towel and place against area 15 minutes on 15 minutes off.  You may switch to heat after 24 hours.Epidural Steroid Injection Patient Information  Description: The epidural space surrounds the nerves as they exit the spinal cord.  In some patients, the nerves can be compressed and inflamed by a bulging disc or a tight spinal canal (spinal stenosis).  By injecting steroids into the epidural space, we can bring irritated nerves into direct contact with a potentially helpful medication.  These  steroids act directly on the irritated nerves and can reduce swelling and inflammation which often leads to decreased pain.  Epidural steroids may be injected anywhere along the spine and from the neck to the low back depending upon the location of your pain.   After numbing the skin with local anesthetic (like Novocaine), a small needle is passed into the epidural space slowly.  You may experience a sensation of pressure while this is being done.  The entire block usually last less than 10 minutes.  Conditions which may be treated by epidural steroids:   Low back and leg pain  Neck and arm pain  Spinal stenosis  Post-laminectomy syndrome  Herpes zoster (shingles) pain  Pain from compression fractures  Preparation for the injection:  1. Do not eat any solid food or dairy products within 8 hours of your appointment.  2. You may drink clear liquids up to 3 hours before appointment.  Clear liquids include water, black coffee, juice or soda.  No milk or cream please. 3. You may take your regular medication, including pain medications, with a sip of water before your appointment  Diabetics should hold regular insulin (if taken separately) and take 1/2 normal NPH dos the morning of the procedure.  Carry some sugar containing items with you to your appointment. 4. A driver must accompany you and be prepared to drive you home after your procedure.  5. Bring all your current medications with your. 6. An IV may be inserted and   sedation may be given at the discretion of the physician.   7. A blood pressure cuff, EKG and other monitors will often be applied during the procedure.  Some patients may need to have extra oxygen administered for a short period. 8. You will be asked to provide medical information, including your allergies, prior to the procedure.  We must know immediately if you are taking blood thinners (like Coumadin/Warfarin)  Or if you are allergic to IV iodine contrast (dye). We must  know if you could possible be pregnant.  Possible side-effects:  Bleeding from needle site  Infection (rare, may require surgery)  Nerve injury (rare)  Numbness & tingling (temporary)  Difficulty urinating (rare, temporary)  Spinal headache ( a headache worse with upright posture)  Light -headedness (temporary)  Pain at injection site (several days)  Decreased blood pressure (temporary)  Weakness in arm/leg (temporary)  Pressure sensation in back/neck (temporary)  Call if you experience:  Fever/chills associated with headache or increased back/neck pain.  Headache worsened by an upright position.  New onset weakness or numbness of an extremity below the injection site  Hives or difficulty breathing (go to the emergency room)  Inflammation or drainage at the infection site  Severe back/neck pain  Any new symptoms which are concerning to you  Please note:  Although the local anesthetic injected can often make your back or neck feel good for several hours after the injection, the pain will likely return.  It takes 3-7 days for steroids to work in the epidural space.  You may not notice any pain relief for at least that one week.  If effective, we will often do a series of three injections spaced 3-6 weeks apart to maximally decrease your pain.  After the initial series, we generally will wait several months before considering a repeat injection of the same type.  If you have any questions, please call [phone]  [facility] 

## 2018-08-05 NOTE — Progress Notes (Signed)
Patient's Name: Jake Kirby  MRN: [id number]  Referring Provider: Gillis Santa, MD  DOB: 23-Feb-1966  PCP: Patient, No Pcp Per  DOS: 08/05/2018  Note by: Gillis Santa, MD  Service setting: Ambulatory outpatient  Specialty: Interventional Pain Management  Patient type: Established  Location: [facility] (AMB) Pain Management Facility  Visit type: Interventional Procedure   Primary Reason for Visit: Interventional Pain Management Treatment. CC: Back Pain (lumbar, left is worse ) and Hip Pain (right )  Procedure:          Anesthesia, Analgesia, Anxiolysis:  Type: Therapeutic Inter-Laminar Epidural Steroid Injection  #1  Region: Lumbar Level: L4-5 Level. Laterality: Right         Type: Moderate (Conscious) Sedation combined with Local Anesthesia Indication(s): Analgesia and Anxiety Route: Intravenous (IV) IV Access: Secured Sedation: Meaningful verbal contact was maintained at all times during the procedure  Local Anesthetic: Lidocaine 1-2%  Position: Prone with head of the table was raised to facilitate breathing.   Indications: 1. Lumbar radiculopathy    Pain Score: Pre-procedure: 8 /10 Post-procedure: 7 /10  Pre-op Assessment:  Jake Kirby is a 53 y.o. (year old), male patient, seen today for interventional treatment. He  has a past surgical history that includes Back surgery. Jake Kirby has a current medication list which includes the following prescription(s): aspirin ec, aspirin-caffeine, hydrochlorothiazide, ibuprofen, and lisinopril. His primarily concern today is the Back Pain (lumbar, left is worse ) and Hip Pain (right )  Initial Vital Signs:  Pulse/HCG Rate: 70ECG Heart Rate: 68 Temp: 98.7 F (37.1 C) Resp: 16 BP: (!) 165/100 SpO2: 97 %  BMI: Estimated body mass index is 37.31 kg/m as calculated from the following:   Height as of this encounter: 5' 8.5" (1.74 m).   Weight as of this encounter: 249 lb (112.9 kg).  Risk Assessment: Allergies: Reviewed. He has No Known  Allergies.  Allergy Precautions: None required Coagulopathies: Reviewed. None identified.  Blood-thinner therapy: None at this time Active Infection(s): Reviewed. None identified. Jake Kirby is afebrile  Site Confirmation: Jake Kirby was asked to confirm the procedure and laterality before marking the site Procedure checklist: Completed Consent: Before the procedure and under the influence of no sedative(s), amnesic(s), or anxiolytics, the patient was informed of the treatment options, risks and possible complications. To fulfill our ethical and legal obligations, as recommended by the American Medical Association's Code of Ethics, I have informed the patient of my clinical impression; the nature and purpose of the treatment or procedure; the risks, benefits, and possible complications of the intervention; the alternatives, including doing nothing; the risk(s) and benefit(s) of the alternative treatment(s) or procedure(s); and the risk(s) and benefit(s) of doing nothing. The patient was provided information about the general risks and possible complications associated with the procedure. These may include, but are not limited to: failure to achieve desired goals, infection, bleeding, organ or nerve damage, allergic reactions, paralysis, and death. In addition, the patient was informed of those risks and complications associated to Spine-related procedures, such as failure to decrease pain; infection (i.e.: Meningitis, epidural or intraspinal abscess); bleeding (i.e.: epidural hematoma, subarachnoid hemorrhage, or any other type of intraspinal or peri-dural bleeding); organ or nerve damage (i.e.: Any type of peripheral nerve, nerve root, or spinal cord injury) with subsequent damage to sensory, motor, and/or autonomic systems, resulting in permanent pain, numbness, and/or weakness of one or several areas of the body; allergic reactions; (i.e.: anaphylactic reaction); and/or death. Furthermore, the patient  was informed of those risks  and complications associated with the medications. These include, but are not limited to: allergic reactions (i.e.: anaphylactic or anaphylactoid reaction(s)); adrenal axis suppression; blood sugar elevation that in diabetics may result in ketoacidosis or comma; water retention that in patients with history of congestive heart failure may result in shortness of breath, pulmonary edema, and decompensation with resultant heart failure; weight gain; swelling or edema; medication-induced neural toxicity; particulate matter embolism and blood vessel occlusion with resultant organ, and/or nervous system infarction; and/or aseptic necrosis of one or more joints. Finally, the patient was informed that Medicine is not an exact science; therefore, there is also the possibility of unforeseen or unpredictable risks and/or possible complications that may result in a catastrophic outcome. The patient indicated having understood very clearly. We have given the patient no guarantees and we have made no promises. Enough time was given to the patient to ask questions, all of which were answered to the patient's satisfaction. Jake Kirby has indicated that he wanted to continue with the procedure. Attestation: I, the ordering provider, attest that I have discussed with the patient the benefits, risks, side-effects, alternatives, likelihood of achieving goals, and potential problems during recovery for the procedure that I have provided informed consent. Date  Time:   Pre-Procedure Preparation:  Monitoring: As per clinic protocol. Respiration, ETCO2, SpO2, BP, heart rate and rhythm monitor placed and checked for adequate function Safety Precautions: Patient was assessed for positional comfort and pressure points before starting the procedure. Time-out: I initiated and conducted the "Time-out" before starting the procedure, as per protocol. The patient was asked to participate by confirming the  accuracy of the "Time Out" information. Verification of the correct person, site, and procedure were performed and confirmed by me, the nursing staff, and the patient. "Time-out" conducted as per Joint Commission's Universal Protocol (UP.01.01.01). Time: 0935  Description of Procedure:          Target Area: The interlaminar space, initially targeting the lower laminar border of the superior vertebral body. Approach: Paramedial approach. Area Prepped: Entire Posterior Lumbar Region Prepping solution: ChloraPrep (2% chlorhexidine gluconate and 70% isopropyl alcohol) Safety Precautions: Aspiration looking for blood return was conducted prior to all injections. At no point did we inject any substances, as a needle was being advanced. No attempts were made at seeking any paresthesias. Safe injection practices and needle disposal techniques used. Medications properly checked for expiration dates. SDV (single dose vial) medications used. Description of the Procedure: Protocol guidelines were followed. The procedure needle was introduced through the skin, ipsilateral to the reported pain, and advanced to the target area. Bone was contacted and the needle walked caudad, until the lamina was cleared. The epidural space was identified using "loss-of-resistance technique" with 2-3 ml of PF-NaCl (0.9% NSS), in a 5cc LOR glass syringe.  Vitals:   08/05/18 0927 08/05/18 0935 08/05/18 0940 08/05/18 0945  BP: (!) 167/115 (!) 158/113  (!) 147/106  Pulse:      Resp: 13 14 15 16   Temp:      TempSrc:      SpO2: 95% 94% 95% 95%  Weight:      Height:        Start Time: 0935 hrs. End Time: 0945 hrs.  Materials:  Needle(s) Type: Epidural needle Gauge: 17G Length: 3.5-in Medication(s): Please see orders for medications and dosing details. 8 cc solution consisting of 5 cc of preservative-free saline, 2 cc of 0.2% ropivacaine, 1 cc of Decadron 10 mg/cc. Imaging Guidance (Spinal):  Type of Imaging  Technique: Fluoroscopy Guidance (Spinal) Indication(s): Assistance in needle guidance and placement for procedures requiring needle placement in or near specific anatomical locations not easily accessible without such assistance. Exposure Time: Please see nurses notes. Contrast: Before injecting any contrast, we confirmed that the patient did not have an allergy to iodine, shellfish, or radiological contrast. Once satisfactory needle placement was completed at the desired level, radiological contrast was injected. Contrast injected under live fluoroscopy. No contrast complications. See chart for type and volume of contrast used. Fluoroscopic Guidance: I was personally present during the use of fluoroscopy. "Tunnel Vision Technique" used to obtain the best possible view of the target area. Parallax error corrected before commencing the procedure. "Direction-depth-direction" technique used to introduce the needle under continuous pulsed fluoroscopy. Once target was reached, antero-posterior, oblique, and lateral fluoroscopic projection used confirm needle placement in all planes. Images permanently stored in EMR. Interpretation: I personally interpreted the imaging intraoperatively. Adequate needle placement confirmed in multiple planes. Appropriate spread of contrast into desired area was observed. No evidence of afferent or efferent intravascular uptake. No intrathecal or subarachnoid spread observed. Permanent images saved into the patient's record.  Antibiotic Prophylaxis:   Anti-infectives (From admission, onward)   None     Indication(s): None identified  Post-operative Assessment:  Post-procedure Vital Signs:  Pulse/HCG Rate: 7061 Temp: 98.7 F (37.1 C) Resp: 16 BP: (!) 147/106 SpO2: 95 %  EBL: None  Complications: No immediate post-treatment complications observed by team, or reported by patient.  Note: The patient tolerated the entire procedure well. A repeat set of vitals were  taken after the procedure and the patient was kept under observation following institutional policy, for this type of procedure. Post-procedural neurological assessment was performed, showing return to baseline, prior to discharge. The patient was provided with post-procedure discharge instructions, including a section on how to identify potential problems. Should any problems arise concerning this procedure, the patient was given instructions to immediately contact us, at any time, without hesitation. In any case, we plan to contact the patient by telephone for a follow-up status report regarding this interventional procedure.  Comments:  No additional relevant information. 5 out of 5 strength bilateral lower extremity: Plantar flexion, dorsiflexion, knee flexion, knee extension.  Plan of Care    Imaging Orders     DG C-Arm 1-60 Min-No Report Procedure Orders    No procedure(s) ordered today    Medications ordered for procedure: Meds ordered this encounter  Medications  . iopamidol (ISOVUE-M) 41 % intrathecal injection 10 mL  . ropivacaine (PF) 2 mg/mL (0.2%) (NAROPIN) injection 2 mL  . sodium chloride flush (NS) 0.9 % injection 2 mL  . lidocaine (XYLOCAINE) 2 % (with pres) injection 200 mg  . dexamethasone (DECADRON) injection 10 mg   Medications administered: We administered iopamidol, ropivacaine (PF) 2 mg/mL (0.2%), sodium chloride flush, lidocaine, and dexamethasone.  See the medical record for exact dosing, route, and time of administration.  Disposition: Discharge home  Discharge Date & Time: 08/05/2018; 1000 hrs.    Future Appointments  Date Time Provider [place]  09/03/2018  9:30 AM Gillis Santa, MD [facility] None   Primary Care Physician: Patient, No Pcp Per Location: [facility] Note by: Gillis Santa, MD Date: 08/05/2018; Time: 10:55 AM  Disclaimer:  Medicine is not an exact science. The only guarantee in medicine is that  nothing is guaranteed. It is important to note that the decision to proceed with this intervention was based on  the information collected from the patient. The Data and conclusions were drawn from the patient's questionnaire, the interview, and the physical examination. Because the information was provided in large part by the patient, it cannot be guaranteed that it has not been purposely or unconsciously manipulated. Every effort has been made to obtain as much relevant data as possible for this evaluation. It is important to note that the conclusions that lead to this procedure are derived in large part from the available data. Always take into account that the treatment will also be dependent on availability of resources and existing treatment guidelines, considered by other Pain Management Practitioners as being common knowledge and practice, at the time of the intervention. For Medico-Legal purposes, it is also important to point out that variation in procedural techniques and pharmacological choices are the acceptable norm. The indications, contraindications, technique, and results of the above procedure should only be interpreted and judged by a Board-Certified Interventional Pain Specialist with extensive familiarity and expertise in the same exact procedure and technique.

## 2018-08-06 ENCOUNTER — Telehealth: Payer: Self-pay | Admitting: *Deleted

## 2018-08-06 NOTE — Telephone Encounter (Signed)
Spoke with patient  Re; procedure on yesterday, no questions or concerns.

## 2018-08-07 DIAGNOSIS — E8881 Metabolic syndrome: Secondary | ICD-10-CM | POA: Diagnosis not present

## 2018-08-07 DIAGNOSIS — I119 Hypertensive heart disease without heart failure: Secondary | ICD-10-CM | POA: Diagnosis not present

## 2018-09-03 ENCOUNTER — Ambulatory Visit
Payer: Commercial Managed Care - PPO | Attending: Student in an Organized Health Care Education/Training Program | Admitting: Student in an Organized Health Care Education/Training Program

## 2018-09-03 ENCOUNTER — Encounter: Payer: Self-pay | Admitting: Student in an Organized Health Care Education/Training Program

## 2018-09-03 ENCOUNTER — Other Ambulatory Visit: Payer: Self-pay

## 2018-09-03 VITALS — BP 174/105 | HR 71 | Temp 98.5°F | Resp 16 | Ht 68.5 in | Wt 249.0 lb

## 2018-09-03 DIAGNOSIS — Z981 Arthrodesis status: Secondary | ICD-10-CM | POA: Diagnosis not present

## 2018-09-03 DIAGNOSIS — M5441 Lumbago with sciatica, right side: Secondary | ICD-10-CM | POA: Insufficient documentation

## 2018-09-03 DIAGNOSIS — M5416 Radiculopathy, lumbar region: Secondary | ICD-10-CM | POA: Diagnosis not present

## 2018-09-03 DIAGNOSIS — G8929 Other chronic pain: Secondary | ICD-10-CM | POA: Diagnosis present

## 2018-09-03 DIAGNOSIS — G894 Chronic pain syndrome: Secondary | ICD-10-CM | POA: Diagnosis not present

## 2018-09-03 DIAGNOSIS — M5442 Lumbago with sciatica, left side: Secondary | ICD-10-CM | POA: Insufficient documentation

## 2018-09-03 NOTE — Progress Notes (Signed)
Safety precautions to be maintained throughout the outpatient stay will include: orient to surroundings, keep bed in low position, maintain call bell within reach at all times, provide assistance with transfer out of bed and ambulation.  

## 2018-09-03 NOTE — Progress Notes (Signed)
Patient's Name: Jake Kirby  MRN: [id number]  Referring Provider: No ref. provider found  DOB: 20-Mar-1966  PCP: Patient, No Pcp Per  DOS: 09/03/2018  Note by: Gillis Santa, MD  Service setting: Ambulatory outpatient  Specialty: Interventional Pain Management  Location: [facility] (AMB) Pain Management Facility    Patient type: Established   Primary Reason(s) for Visit: Encounter for post-procedure evaluation of chronic illness with mild to moderate exacerbation CC: Back Pain (lower)  HPI  Jake Kirby is a 53 y.o. year old, male patient, who comes today for a post-procedure evaluation. He has History of lumbar fusion (L5-S1); Lumbar radiculopathy; Lumbar facet arthropathy; Chronic bilateral low back pain with bilateral sciatica; and Chronic pain syndrome on their problem list. His primarily concern today is the Back Pain (lower)  Pain Assessment: Location: Lower, Right, Left Back Radiating: into right hip, down both legs to feet, left side worse Onset: More than a month ago Duration: Chronic pain Quality: Constant, Aching, Burning, Shooting, Sharp Severity: 8 /10 (subjective, self-reported pain score)  Note: Reported level is inconsistent with clinical observations.                         When using our objective Pain Scale, levels between 6 and 10/10 are said to belong in an emergency room, as it progressively worsens from a 6/10, described as severely limiting, requiring emergency care not usually available at an outpatient pain management facility. At a 6/10 level, communication becomes difficult and requires great effort. Assistance to reach the emergency department may be required. Facial flushing and profuse sweating along with potentially dangerous increases in heart rate and blood pressure will be evident. Effect on ADL: struggle to be on feet at work all day Timing: Constant Modifying factors: BCs, getting off feet BP: (!) 174/105  HR: 71  Jake Kirby comes in today for post-procedure  evaluation.  Further details on both, my assessment(s), as well as the proposed treatment plan, please see below.  Post-Procedure Assessment  08/05/2018 Procedure: Right lumbar ESI #2 (at right L4-L5) Pre-procedure pain score:  8/10 Post-procedure pain score: 7/10         Influential Factors: BMI: 37.31 kg/m Intra-procedural challenges: None observed.         Assessment challenges: None detected.              Reported side-effects: None.        Post-procedural adverse reactions or complications: None reported         Sedation: Please see nurses note. When no sedatives are used, the analgesic levels obtained are directly associated to the effectiveness of the local anesthetics. However, when sedation is provided, the level of analgesia obtained during the initial 1 hour following the intervention, is believed to be the result of a combination of factors. These factors may include, but are not limited to: 1. The effectiveness of the local anesthetics used. 2. The effects of the analgesic(s) and/or anxiolytic(s) used. 3. The degree of discomfort experienced by the patient at the time of the procedure. 4. The patients ability and reliability in recalling and recording the events. 5. The presence and influence of possible secondary gains and/or psychosocial factors. Reported result: Relief experienced during the 1st hour after the procedure: 100 % (Ultra-Short Term Relief)            Interpretative annotation: Clinically appropriate result. Analgesia during this period is likely to be Local Anesthetic and/or IV Sedative (Analgesic/Anxiolytic) related.  Effects of local anesthetic: The analgesic effects attained during this period are directly associated to the localized infiltration of local anesthetics and therefore cary significant diagnostic value as to the etiological location, or anatomical origin, of the pain. Expected duration of relief is directly dependent on the pharmacodynamics  of the local anesthetic used. Long-acting (4-6 hours) anesthetics used.  Reported result: Relief during the next 4 to 6 hour after the procedure: 100 % (Short-Term Relief)            Interpretative annotation: Clinically appropriate result. Analgesia during this period is likely to be Local Anesthetic-related.          Long-term benefit: Defined as the period of time past the expected duration of local anesthetics (1 hour for short-acting and 4-6 hours for long-acting). With the possible exception of prolonged sympathetic blockade from the local anesthetics, benefits during this period are typically attributed to, or associated with, other factors such as analgesic sensory neuropraxia, antiinflammatory effects, or beneficial biochemical changes provided by agents other than the local anesthetics.  Reported result: Extended relief following procedure: 100 %(X 1.5 days, then "back to where i was, pain is just a little more tolerable") (Long-Term Relief)            Interpretative annotation: Clinically possible results. Short-term benefit. Incomplete therapeutic success. Pain appears to be refractory to this treatment modality.          Current benefits: Defined as reported results that persistent at this point in time.   Analgesia: 0 %            Function: Back to baseline ROM: Back to baseline Interpretative annotation: Recurrence of symptoms. Therapeutic failure. Results would argue against repeating therapy.          Interpretation: Results would suggest failure of therapy in achieving desired goal(s).                  Plan:  Please see "Plan of Care" for details.                Laboratory Chemistry  Inflammation Markers (CRP: Acute Phase) (ESR: Chronic Phase) No results found for: CRP, ESRSEDRATE, LATICACIDVEN                       Rheumatology Markers No results found for: RF, ANA, LABURIC, URICUR, LYMEIGGIGMAB, LYMEABIGMQN, HLAB27                      Renal Function Markers Lab Results   Component Value Date   BUN 16 04/01/2015   CREATININE 0.92 04/01/2015   GFRAA >60 04/01/2015   GFRNONAA >60 04/01/2015                             Hepatic Function Markers Lab Results  Component Value Date   AST 18 04/01/2015   ALT 22 04/01/2015   ALBUMIN 4.0 04/01/2015   ALKPHOS 68 04/01/2015   LIPASE 25 04/01/2015                        Electrolytes Lab Results  Component Value Date   NA 142 04/01/2015   K 3.7 04/01/2015   CL 105 04/01/2015   CALCIUM 9.0 04/01/2015                        Neuropathy Markers No results found  for: VITAMINB12, FOLATE, HGBA1C, HIV                      CNS Tests No results found for: COLORCSF, APPEARCSF, RBCCOUNTCSF, WBCCSF, POLYSCSF, LYMPHSCSF, EOSCSF, PROTEINCSF, GLUCCSF, JCVIRUS, CSFOLI, IGGCSF                      Bone Pathology Markers No results found for: VD25OH, KG254YH0WCB, [id number], OH6073XT0, 25OHVITD1, 25OHVITD2, 25OHVITD3, TESTOFREE, TESTOSTERONE                       Coagulation Parameters Lab Results  Component Value Date   PLT 317 04/01/2015                        Cardiovascular Markers Lab Results  Component Value Date   HGB 13.6 04/01/2015   HCT 42.2 04/01/2015                         CA Markers No results found for: CEA, CA125, LABCA2                      Endocrine Markers No results found for: TSH, FREET4, TESTOFREE, TESTOSTERONE, ESTRADIOL, ESTRADIOLPCT, ESTRADIOLFRE                      Note: Lab results reviewed.  Recent Diagnostic Imaging Results  DG C-Arm 1-60 Min-No Report Fluoroscopy was utilized by the requesting physician.  No radiographic  interpretation.   Complexity Note: Imaging results reviewed. Results shared with Mr. Rothenberger, using Layman's terms.                         Meds   Current Outpatient Medications:  .  aspirin EC 81 MG tablet, Take 81 mg by mouth daily., Disp: , Rfl:  .  Aspirin-Caffeine (BC FAST PAIN RELIEF ARTHRITIS) 1000-65 MG PACK, Take by mouth., Disp: , Rfl:  .   ibuprofen (ADVIL,MOTRIN) 800 MG tablet, Take 800 mg by mouth every 8 (eight) hours as needed., Disp: , Rfl:  .  lisinopril (PRINIVIL,ZESTRIL) 20 MG tablet, Take 20 mg by mouth daily., Disp: , Rfl:  .  hydrochlorothiazide (HYDRODIURIL) 25 MG tablet, Take 25 mg by mouth daily., Disp: , Rfl:   ROS  Constitutional: Denies any fever or chills Gastrointestinal: No reported hemesis, hematochezia, vomiting, or acute GI distress Musculoskeletal: Denies any acute onset joint swelling, redness, loss of ROM, or weakness Neurological: No reported episodes of acute onset apraxia, aphasia, dysarthria, agnosia, amnesia, paralysis, loss of coordination, or loss of consciousness  Allergies  Mr. Malter has No Known Allergies.  PFSH  Drug: Mr. Sattar  reports no history of drug use. Alcohol:  reports current alcohol use of about 5.0 standard drinks of alcohol per week. Tobacco:  reports that he has quit smoking. His smoking use included cigarettes. He has never used smokeless tobacco. Medical:  has a past medical history of Hypertension. Surgical: Mr. Tetrault  has a past surgical history that includes Back surgery. Family: family history is not on file.  Constitutional Exam  General appearance: Well nourished, well developed, and well hydrated. In no apparent acute distress Vitals:   09/03/18 0929  BP: (!) 174/105  Pulse: 71  Resp: 16  Temp: 98.5 F (36.9 C)  TempSrc: Oral  SpO2: 98%  Weight: 249 lb (112.9 kg)  Height:  5' 8.5" (1.74 m)   BMI Assessment: Estimated body mass index is 37.31 kg/m as calculated from the following:   Height as of this encounter: 5' 8.5" (1.74 m).   Weight as of this encounter: 249 lb (112.9 kg).  BMI interpretation table: BMI level Category Range association with higher incidence of chronic pain  <18 kg/m2 Underweight   18.5-24.9 kg/m2 Ideal body weight   25-29.9 kg/m2 Overweight Increased incidence by 20%  30-34.9 kg/m2 Obese (Class I) Increased incidence by 68%   35-39.9 kg/m2 Severe obesity (Class II) Increased incidence by 136%  >40 kg/m2 Extreme obesity (Class III) Increased incidence by 254%   Patient's current BMI Ideal Body weight  Body mass index is 37.31 kg/m. Ideal body weight: 69.6 kg (153 lb 5.3 oz) Adjusted ideal body weight: 86.9 kg (191 lb 9.6 oz)   BMI Readings from Last 4 Encounters:  09/03/18 37.31 kg/m  08/05/18 37.31 kg/m  07/28/18 37.31 kg/m  07/01/18 38.36 kg/m   Wt Readings from Last 4 Encounters:  09/03/18 249 lb (112.9 kg)  08/05/18 249 lb (112.9 kg)  07/28/18 249 lb (112.9 kg)  07/01/18 256 lb (116.1 kg)  Psych/Mental status: Alert, oriented x 3 (person, place, & time)       Eyes: PERLA Respiratory: No evidence of acute respiratory distress  Cervical Spine Area Exam  Skin & Axial Inspection: No masses, redness, edema, swelling, or associated skin lesions Alignment: Symmetrical Functional ROM: Unrestricted ROM      Stability: No instability detected Muscle Tone/Strength: Functionally intact. No obvious neuro-muscular anomalies detected. Sensory (Neurological): Unimpaired Palpation: No palpable anomalies              Upper Extremity (UE) Exam    Side: Right upper extremity  Side: Left upper extremity  Skin & Extremity Inspection: Skin color, temperature, and hair growth are WNL. No peripheral edema or cyanosis. No masses, redness, swelling, asymmetry, or associated skin lesions. No contractures.  Skin & Extremity Inspection: Skin color, temperature, and hair growth are WNL. No peripheral edema or cyanosis. No masses, redness, swelling, asymmetry, or associated skin lesions. No contractures.  Functional ROM: Unrestricted ROM          Functional ROM: Unrestricted ROM          Muscle Tone/Strength: Functionally intact. No obvious neuro-muscular anomalies detected.  Muscle Tone/Strength: Functionally intact. No obvious neuro-muscular anomalies detected.  Sensory (Neurological): Unimpaired          Sensory  (Neurological): Unimpaired          Palpation: No palpable anomalies              Palpation: No palpable anomalies              Provocative Test(s):  Phalen's test: deferred Tinel's test: deferred Apley's scratch test (touch opposite shoulder):  Action 1 (Across chest): deferred Action 2 (Overhead): deferred Action 3 (LB reach): deferred   Provocative Test(s):  Phalen's test: deferred Tinel's test: deferred Apley's scratch test (touch opposite shoulder):  Action 1 (Across chest): deferred Action 2 (Overhead): deferred Action 3 (LB reach): deferred    Thoracic Spine Area Exam  Skin & Axial Inspection: No masses, redness, or swelling Alignment: Symmetrical Functional ROM: Unrestricted ROM Stability: No instability detected Muscle Tone/Strength: Functionally intact. No obvious neuro-muscular anomalies detected. Sensory (Neurological): Unimpaired Muscle strength & Tone: No palpable anomalies  Lumbar Spine Area Exam  Skin & Axial Inspection: No masses, redness, or swelling Alignment: Symmetrical Functional ROM: Decreased ROM affecting primarily  the right Stability: No instability detected Muscle Tone/Strength: Functionally intact. No obvious neuro-muscular anomalies detected. Sensory (Neurological): Dermatomal pain pattern, right L4-L5 Palpation: No palpable anomalies       Provocative Tests: Hyperextension/rotation test: (+) due to pain. Lumbar quadrant test (Kemp's test): (+) on the right for foraminal stenosis Lateral bending test: (+) due to fusion restriction. Patrick's Maneuver: deferred today                   FABER* test: deferred today                   S-I anterior distraction/compression test: deferred today         S-I lateral compression test: deferred today         S-I Thigh-thrust test: deferred today         S-I Gaenslen's test: deferred today         *(Flexion, ABduction and External Rotation)  Gait & Posture Assessment  Ambulation: Unassisted Gait:  Relatively normal for age and body habitus Posture: WNL   Lower Extremity Exam    Side: Right lower extremity  Side: Left lower extremity  Stability: No instability observed          Stability: No instability observed          Skin & Extremity Inspection: Skin color, temperature, and hair growth are WNL. No peripheral edema or cyanosis. No masses, redness, swelling, asymmetry, or associated skin lesions. No contractures.  Skin & Extremity Inspection: Skin color, temperature, and hair growth are WNL. No peripheral edema or cyanosis. No masses, redness, swelling, asymmetry, or associated skin lesions. No contractures.  Functional ROM: Pain restricted ROM for hip and knee joints          Functional ROM: Unrestricted ROM                  Muscle Tone/Strength: Functionally intact. No obvious neuro-muscular anomalies detected.  Muscle Tone/Strength: Functionally intact. No obvious neuro-muscular anomalies detected.  Sensory (Neurological): Dermatomal pain pattern top of foot (L5) and S1  Sensory (Neurological): Unimpaired        DTR: Patellar: 1+: trace Achilles: deferred today Plantar: deferred today  DTR: Patellar: deferred today Achilles: deferred today Plantar: deferred today  Palpation: No palpable anomalies  Palpation: No palpable anomalies   Assessment   Status Diagnosis  Persistent Persistent Persistent 1. Lumbar radiculopathy   2. History of lumbar fusion (L5-S1)   3. Chronic pain syndrome   4. Chronic bilateral low back pain with bilateral sciatica      Very pleasant 53 year old male follows up status post lumbar epidural steroid injection #2.  Patient states that the lumbar epidural steroid injection only provided him pain relief for approximately 2 days and he had return of pain thereafter back to baseline pretty quickly.  At this point we have completed 2 lumbar epidural steroid injections without any significant benefit.  We had extensive discussion with the patient about  lumbar spinal cord stimulation.  Resources were provided about this.  Patient states that he wants to hold off on this.  I also recommended the patient follow back up with Dr. Cari Caraway as he has suggested if the lumbar control steroid injections were not helpful.  I informed the patient that if the patient would like to proceed with a spinal cord stimulator trial, we will need to obtain a psychological assessment after which we can hopefully get him scheduled.  I provided the patient with resources regarding lumbar  spinal cord stimulation for failed back surgical syndrome.  Patient states that he would let me know if he would like to proceed with spinal cord stimulator trial.  Time Note: Greater than 50% of the 25 minute(s) of face-to-face time spent with Mr. Marzec, was spent in counseling/coordination of care regarding: Mr. Gillison primary cause of pain, the treatment plan, treatment alternatives, the risks and possible complications of proposed treatment, the results, interpretation and significance of  his recent diagnostic interventional treatment(s), realistic expectations and the goals of pain management (increased in functionality).  Provider-requested follow-up: Return SCS trial (will need psych eval prior).  No future appointments.  Primary Care Physician: Patient, No Pcp Per Location: [facility] Note by: Gillis Santa, M.D Date: 09/03/2018; Time: 1:40 PM  There are no Patient Instructions on file for this visit.

## 2018-10-09 DIAGNOSIS — E8881 Metabolic syndrome: Secondary | ICD-10-CM | POA: Diagnosis not present

## 2018-10-09 DIAGNOSIS — I119 Hypertensive heart disease without heart failure: Secondary | ICD-10-CM | POA: Diagnosis not present

## 2018-11-02 ENCOUNTER — Encounter: Payer: Self-pay | Admitting: *Deleted

## 2018-11-05 ENCOUNTER — Telehealth: Payer: Self-pay | Admitting: Gastroenterology

## 2018-11-05 ENCOUNTER — Other Ambulatory Visit: Payer: Self-pay

## 2018-11-05 DIAGNOSIS — Z1211 Encounter for screening for malignant neoplasm of colon: Secondary | ICD-10-CM

## 2018-11-05 NOTE — Telephone Encounter (Signed)
Gastroenterology Pre-Procedure Review  Request Date: Fri 02/12/2019   [facility] Requesting Physician: Dr. Bonna Gains  PATIENT REVIEW QUESTIONS: The patient responded to the following health history questions as indicated:    1. Are you having any GI issues? no 2. Do you have a personal history of Polyps? no 3. Do you have a family history of Colon Cancer or Polyps? no 4. Diabetes Mellitus? no 5. Joint replacements in the past 12 months?no 6. Major health problems in the past 3 months?no 7. Any artificial heart valves, MVP, or defibrillator?no    MEDICATIONS & ALLERGIES:    Patient reports the following regarding taking any anticoagulation/antiplatelet therapy:   Plavix, Coumadin, Eliquis, Xarelto, Lovenox, Pradaxa, Brilinta, or Effient? no Aspirin? yes (81 mg)  Patient confirms/reports the following medications:  Current Outpatient Medications  Medication Sig Dispense Refill  . aspirin EC 81 MG tablet Take 81 mg by mouth daily.    . Aspirin-Caffeine (BC FAST PAIN RELIEF ARTHRITIS) 1000-65 MG PACK Take by mouth.    . hydrochlorothiazide (HYDRODIURIL) 25 MG tablet Take 25 mg by mouth daily.    Marland Kitchen ibuprofen (ADVIL,MOTRIN) 800 MG tablet Take 800 mg by mouth every 8 (eight) hours as needed.    Marland Kitchen lisinopril (PRINIVIL,ZESTRIL) 20 MG tablet Take 20 mg by mouth daily.     No current facility-administered medications for this visit.     Patient confirms/reports the following allergies:  No Known Allergies  No orders of the defined types were placed in this encounter.   AUTHORIZATION INFORMATION Primary Insurance: 1D#: Group #:  Secondary Insurance: 1D#: Group #:  SCHEDULE INFORMATION: Date:  Time: Location:

## 2019-02-08 ENCOUNTER — Other Ambulatory Visit
Admission: RE | Admit: 2019-02-08 | Discharge: 2019-02-08 | Disposition: A | Discharge status: Home / Self Care | Payer: Commercial Managed Care - PPO | Source: Ambulatory Visit | Attending: Gastroenterology | Admitting: Gastroenterology

## 2019-02-08 ENCOUNTER — Other Ambulatory Visit: Payer: Self-pay

## 2019-02-08 DIAGNOSIS — Z1159 Encounter for screening for other viral diseases: Secondary | ICD-10-CM | POA: Diagnosis present

## 2019-02-08 LAB — SARS CORONAVIRUS 2 (TAT 6-24 HRS): SARS Coronavirus 2: NEGATIVE

## 2019-02-11 ENCOUNTER — Encounter: Payer: Self-pay | Admitting: *Deleted

## 2019-02-12 ENCOUNTER — Encounter: Payer: Self-pay | Admitting: Certified Registered"

## 2019-02-12 ENCOUNTER — Ambulatory Visit
Admission: RE | Admit: 2019-02-12 | Discharge: 2019-02-12 | Disposition: A | Discharge status: Home / Self Care | Payer: Commercial Managed Care - PPO | Attending: Gastroenterology | Admitting: Gastroenterology

## 2019-02-12 ENCOUNTER — Encounter: Payer: Self-pay | Admitting: *Deleted

## 2019-02-12 ENCOUNTER — Encounter
Admission: RE | Disposition: A | Discharge status: Home / Self Care | Payer: Self-pay | Source: Home / Self Care | Attending: Gastroenterology

## 2019-02-12 ENCOUNTER — Other Ambulatory Visit: Payer: Self-pay

## 2019-02-12 DIAGNOSIS — Z1211 Encounter for screening for malignant neoplasm of colon: Secondary | ICD-10-CM | POA: Insufficient documentation

## 2019-02-12 DIAGNOSIS — Z538 Procedure and treatment not carried out for other reasons: Secondary | ICD-10-CM | POA: Insufficient documentation

## 2019-02-12 HISTORY — PX: COLONOSCOPY WITH PROPOFOL: SHX5780

## 2019-02-12 SURGERY — COLONOSCOPY WITH PROPOFOL
Anesthesia: General

## 2019-02-12 MED ORDER — PROPOFOL 500 MG/50ML IV EMUL
INTRAVENOUS | Status: AC
  Filled 2019-02-12: qty 300

## 2019-02-15 ENCOUNTER — Telehealth: Payer: Self-pay

## 2019-02-15 NOTE — Telephone Encounter (Signed)
Discussed rescheduling patients colonoscopy with him, however he was driving at the time and has requested me to call him back.  Thanks [employer]

## 2019-02-15 NOTE — Telephone Encounter (Signed)
-----   Message from Virgel Manifold, MD sent at 02/12/2019  8:51 AM EDT ----- Pt showed up for his procedure without a ride and drank a mocha in the morning. Procedure was not done and needs to be rescheduled.

## 2019-02-24 ENCOUNTER — Telehealth: Payer: Self-pay

## 2019-02-24 NOTE — Telephone Encounter (Signed)
Recalled patient to schedule his colonoscopy (Screening) he states that he is experiencing epigastric cramping like discomfort.  He has been scheduled a virtual visit with Dr. Bonna Gains on Monday 03/01/19.  Thanks, Sharyn Lull

## 2019-03-01 ENCOUNTER — Other Ambulatory Visit: Payer: Self-pay

## 2019-03-01 ENCOUNTER — Ambulatory Visit (INDEPENDENT_AMBULATORY_CARE_PROVIDER_SITE_OTHER): Payer: Commercial Managed Care - PPO | Admitting: Gastroenterology

## 2019-03-01 ENCOUNTER — Encounter: Payer: Self-pay | Admitting: Gastroenterology

## 2019-03-01 ENCOUNTER — Telehealth: Payer: Self-pay

## 2019-03-01 DIAGNOSIS — K219 Gastro-esophageal reflux disease without esophagitis: Secondary | ICD-10-CM

## 2019-03-01 NOTE — Telephone Encounter (Signed)
[facility] to schedule procedures. Pt needs: 1- EGD for GERD. 2- Colonoscopy for screening.

## 2019-03-02 NOTE — Progress Notes (Signed)
Jake Kirby  [place], [place] [address]  Main: [phone]  Fax: [phone]   Gastroenterology Consultation  Referring Provider:     Cletis Athens, MD Primary Care Physician:  Cletis Athens, MD Reason for Consultation:     Epigastric pain        HPI:   Virtual Visit via Video Note  I connected with patient on 03/02/19 at 11:15 AM EDT by video ([email or web address]) and verified that I am speaking with the correct person using two identifiers.   I discussed the limitations, risks, security and privacy concerns of performing an evaluation and management service by video and the availability of in person appointments. I also discussed with the patient that there may be a patient responsible charge related to this service. The patient expressed understanding and agreed to proceed.  Location of the patient: Home Location of provider: Home Participating persons: Patient and provider only (Nursing staff checked in patient via phone but were not physically involved in the video interaction - see their notes)   History of Present Illness: Chief Complaint  Patient presents with  . New Patient (Initial Visit)    epigastric pain/routine colonoscopy    Jake Kirby is a 53 y.o. y/o male referred for consultation & management  by Dr. Cletis Athens, MD.  Patient reports chest and epigastric pain, intermittently started 1 to 2 months ago.  No dysphagia.  No nausea or vomiting.  Work-up for chest pain with cardiologist, describes having a treadmill stress test which he states was negative.  Also takes BC powders and ibuprofen intermittently, will about once every 2 weeks.  No melena or hematochezia.  No weight loss.  No prior EGD or colonoscopy  Past Medical History:  Diagnosis Date  . Hypertension     Past Surgical History:  Procedure Laterality Date  . BACK SURGERY    . COLONOSCOPY WITH PROPOFOL N/A 02/12/2019   Procedure: COLONOSCOPY WITH PROPOFOL;   Surgeon: Virgel Manifold, MD;  Location: [facility] ENDOSCOPY;  Service: Endoscopy;  Laterality: N/A;    Prior to Admission medications   Medication Sig Start Date End Date Taking? Authorizing Provider  aspirin EC 81 MG tablet Take 81 mg by mouth daily.   Yes [provider]  Aspirin-Caffeine (BC FAST PAIN RELIEF ARTHRITIS) 1000-65 MG PACK Take by mouth.   Yes [provider]  hydrochlorothiazide (HYDRODIURIL) 25 MG tablet Take 25 mg by mouth daily. 07/25/18  Yes [provider]  ibuprofen (ADVIL,MOTRIN) 800 MG tablet Take 800 mg by mouth every 8 (eight) hours as needed.   Yes [provider]  lisinopril (PRINIVIL,ZESTRIL) 20 MG tablet Take 20 mg by mouth daily. 07/25/18  Yes [provider]    History reviewed. No pertinent family history.   Social History   Tobacco Use  . Smoking status: Former Smoker    Types: Cigarettes  . Smokeless tobacco: Never Used  Substance Use Topics  . Alcohol use: Yes    Alcohol/week: 5.0 standard drinks    Types: 5 Cans of beer per week  . Drug use: No    Allergies as of 03/01/2019  . (No Known Allergies)    Review of Systems:    All systems reviewed and negative except where noted in HPI.   Observations/Objective:  Labs: CBC    Component Value Date/Time   WBC 8.7 04/01/2015 1603   RBC 4.98 04/01/2015 1603   HGB 13.6 04/01/2015 1603   HCT 42.2 04/01/2015  1603   PLT 317 04/01/2015 1603   MCV 84.7 04/01/2015 1603   MCH 27.3 04/01/2015 1603   MCHC 32.2 04/01/2015 1603   RDW 13.6 04/01/2015 1603   CMP     Component Value Date/Time   NA 142 04/01/2015 1603   K 3.7 04/01/2015 1603   CL 105 04/01/2015 1603   CO2 29 04/01/2015 1603   GLUCOSE 112 (H) 04/01/2015 1603   BUN 16 04/01/2015 1603   CREATININE 0.92 04/01/2015 1603   CALCIUM 9.0 04/01/2015 1603   PROT 8.1 04/01/2015 1603   ALBUMIN 4.0 04/01/2015 1603   AST 18 04/01/2015 1603   ALT 22 04/01/2015 1603   ALKPHOS 68 04/01/2015 1603    BILITOT 0.6 04/01/2015 1603   GFRNONAA >60 04/01/2015 1603   GFRAA >60 04/01/2015 1603    Imaging Studies: No results found.  Assessment and Plan:   DEVYNN SCHEFF is a 53 y.o. y/o male has been referred for intermittent chest and epigastric pain  Assessment and Plan: Patient describes having cardiac work-up recently which was negative  Therefore, symptoms may be due to GERD or esophageal spasm  Patient also was advised to avoid NSAIDs completely and he verbalized understanding  EGD indicated for evaluation of symptoms  Colonoscopy indicated for screening  I have discussed alternative options, risks & benefits,  which include, but are not limited to, bleeding, infection, perforation,respiratory complication & drug reaction.  The patient agrees with this plan & written consent will be obtained.    Patient educated extensively on acid reflux lifestyle modification, including buying a bed wedge, not eating 3 hrs before bedtime, diet modifications, and handout given for the same.    Follow Up Instructions: Follow-up in 2 to 3 months  I discussed the assessment and treatment plan with the patient. The patient was provided an opportunity to ask questions and all were answered. The patient agreed with the plan and demonstrated an understanding of the instructions.   The patient was advised to call back or seek an in-person evaluation if the symptoms worsen or if the condition fails to improve as anticipated.  I provided 20 minutes of face-to-face time via video software during this encounter.  Additional time was spent in reviewing patient's chart, placing orders etc.   Virgel Manifold, MD  Speech recognition software was used to dictate the above note.

## 2019-03-30 NOTE — Telephone Encounter (Signed)
Unable to reach pt. Voice mail box not set up.

## 2019-05-11 ENCOUNTER — Other Ambulatory Visit: Payer: Self-pay

## 2019-05-12 ENCOUNTER — Encounter: Payer: Self-pay | Admitting: Gastroenterology

## 2019-05-12 ENCOUNTER — Ambulatory Visit: Payer: Commercial Managed Care - PPO | Admitting: Gastroenterology

## 2019-06-07 ENCOUNTER — Ambulatory Visit: Payer: Commercial Managed Care - PPO | Admitting: Gastroenterology

## 2019-07-13 IMAGING — CT CT L SPINE W/O CM
3 series · 12 of 33 positions shown, 14 images · non-contrast
Comparison: Lumbar spine radiographs 01/10/2004

CLINICAL DATA: Low back pain right leg pain.  Lumbar fusion 9449

EXAM:
CT LUMBAR SPINE WITHOUT CONTRAST
TECHNIQUE: Multidetector CT imaging of the lumbar spine was performed without
intravenous contrast administration. Multiplanar CT image
reconstructions were also generated.

[Series 4: l spine soft · axial · 0.33mm/px · z∈[-304,-142]mm · 4 of 118 slices shown, 5 images]
[im 19/118  soft-tissue]
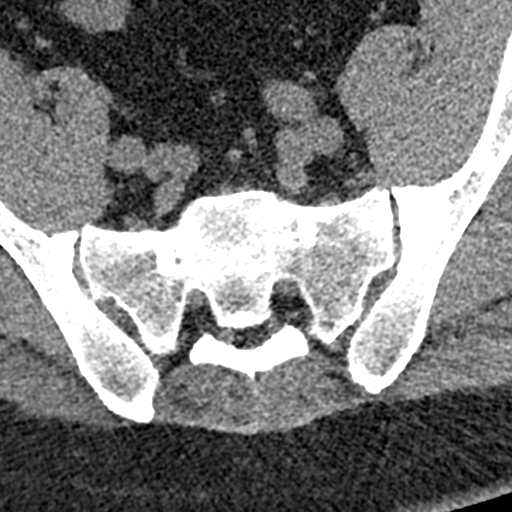
[im 19/118  bone]
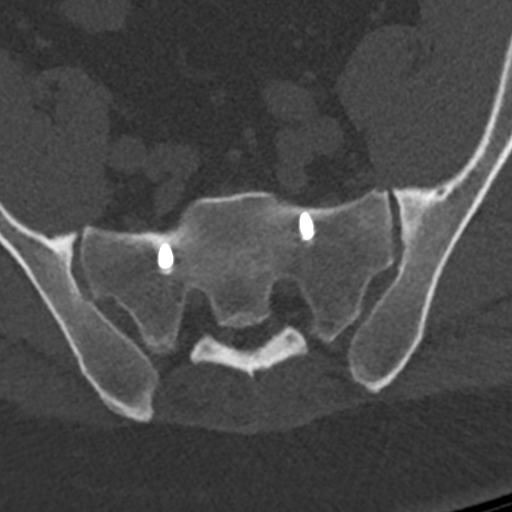
[im 46/118  bone]
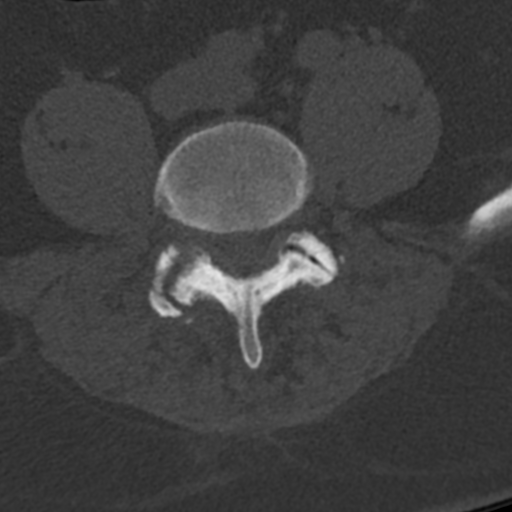
[im 73/118  bone]
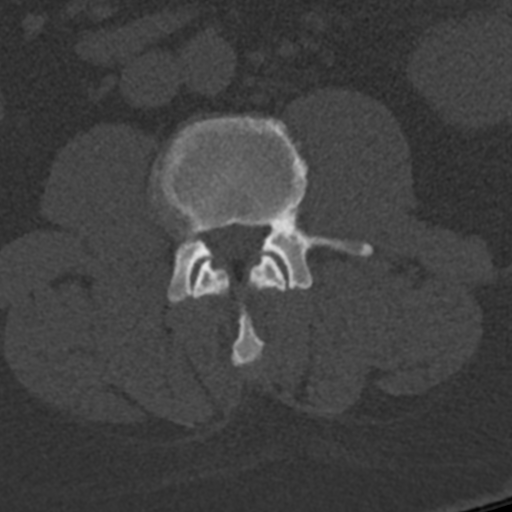
[im 100/118  bone]
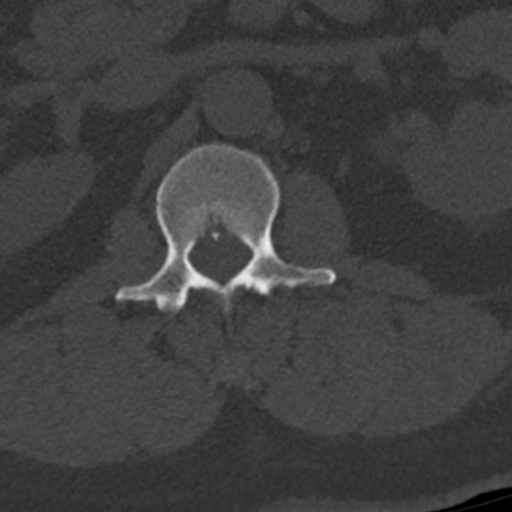

[Series 7: sagittal bone · sagittal · 0.28mm/px · 5 of 87 slices shown, 6 images]
[im 29/87  bone]
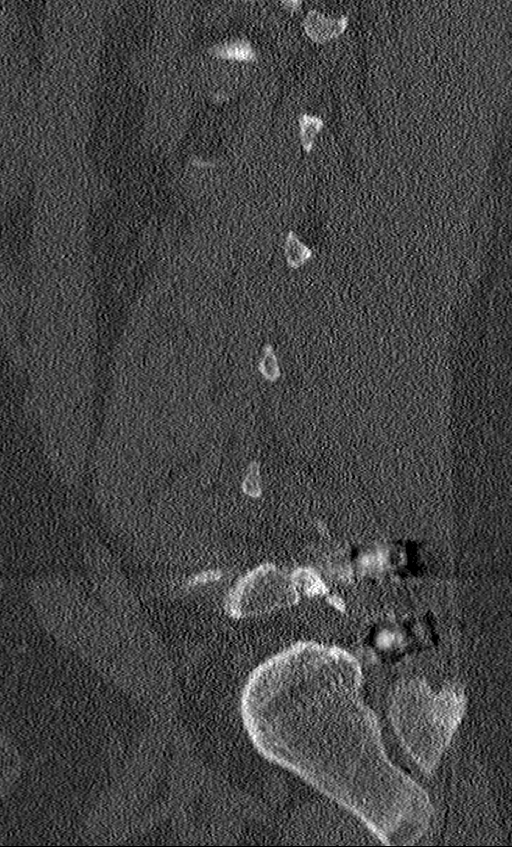
[im 36/87  bone]
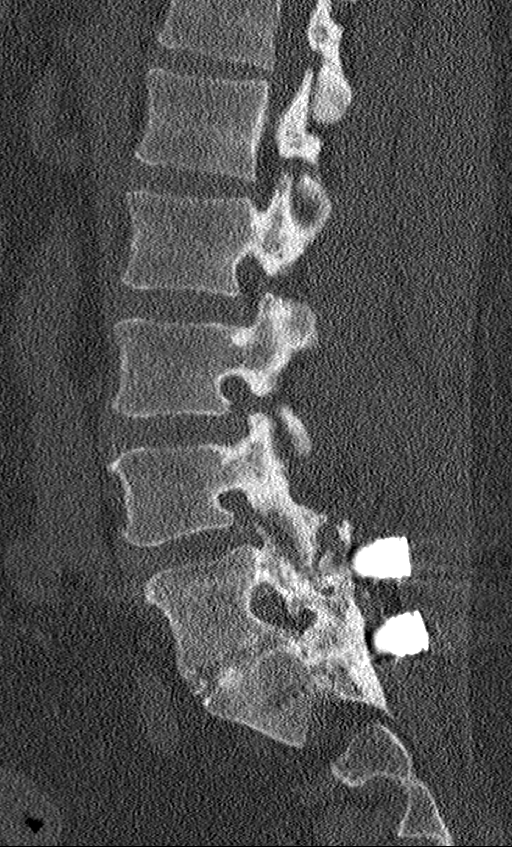
[im 44/87  soft-tissue]
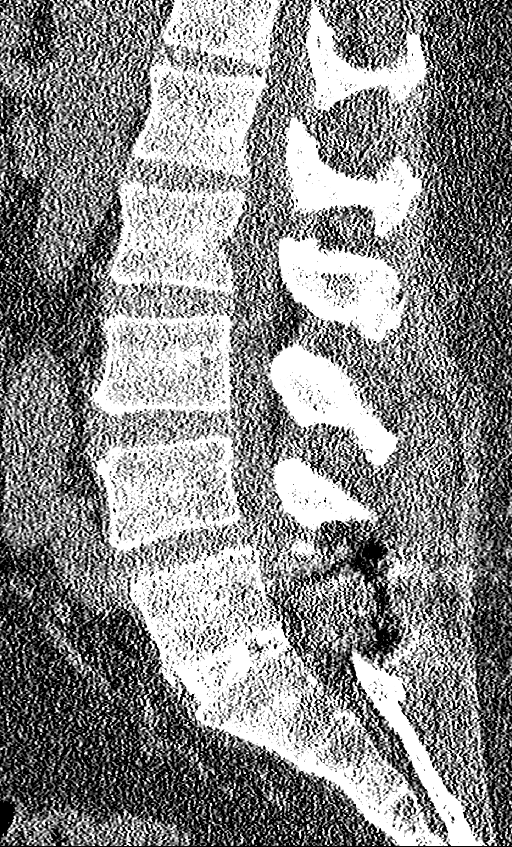
[im 44/87  bone]
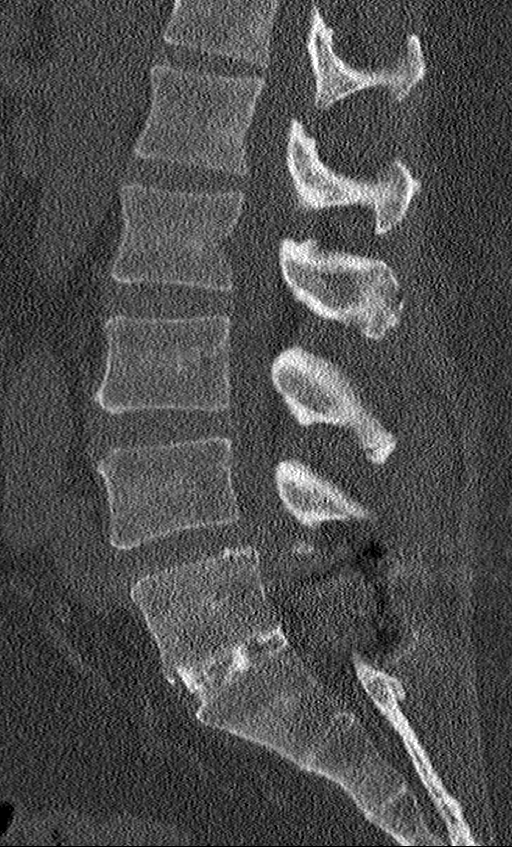
[im 51/87  bone]
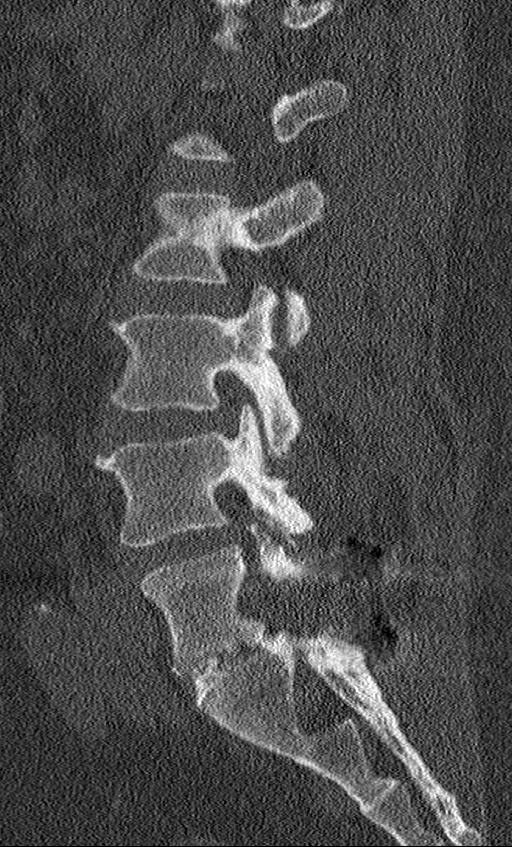
[im 58/87  bone]
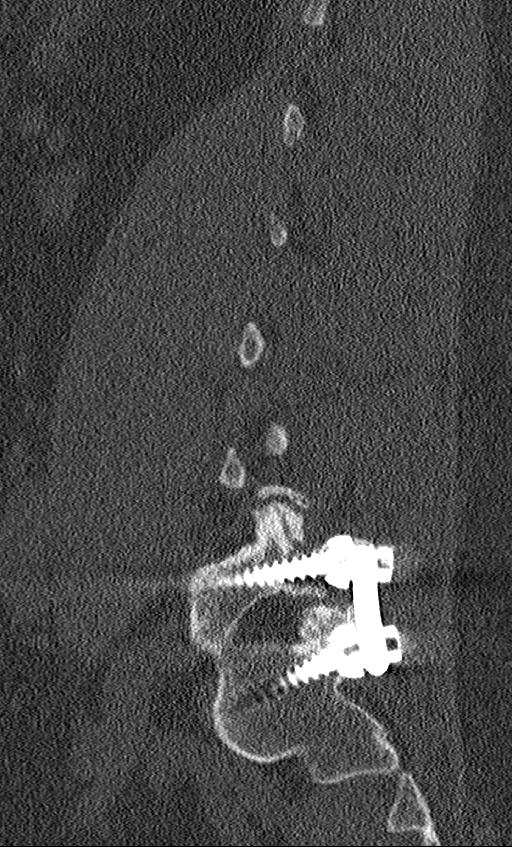

[Series 8: coronal bone · coronal · 0.32mm/px · 3 of 64 slices shown]
[im 13/64  bone]
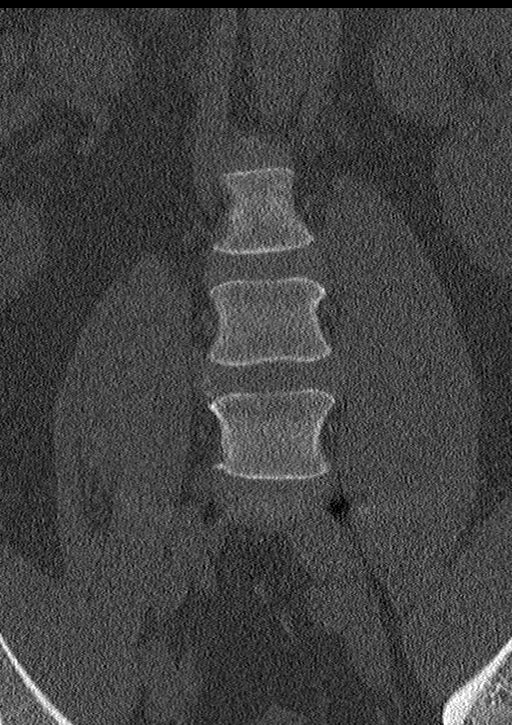
[im 26/64  bone]
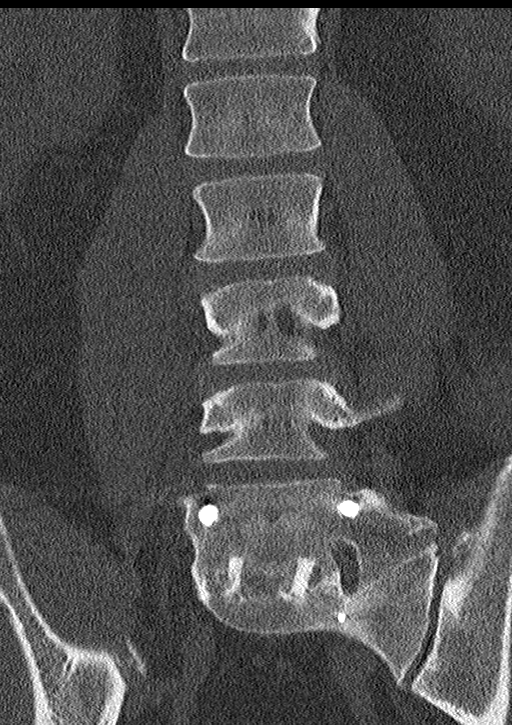
[im 38/64  bone]
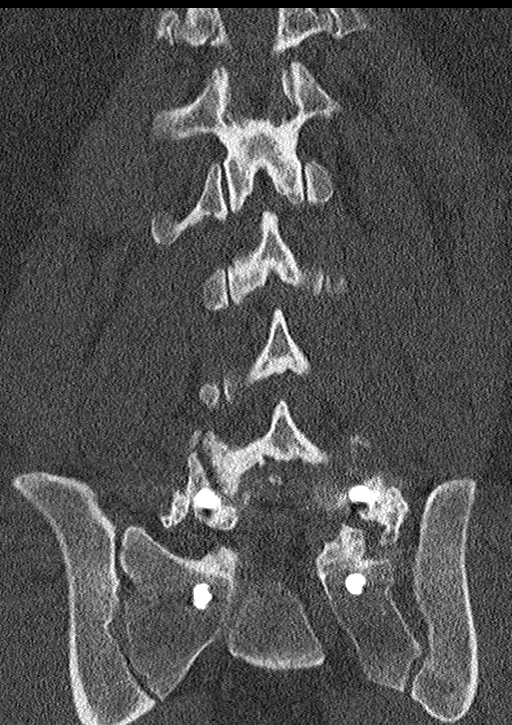

[12 of 33 positions shown; findings below may reference images not displayed]

FINDINGS: Segmentation: Normal

Alignment: Mild anterolisthesis L4-5.  Remaining alignment normal.

Vertebrae: Pedicle screw and interbody fusion L5-S1. No fracture or
skeletal mass lesion.

Paraspinal and other soft tissues: Negative

Disc levels: L1-2: Mild disc bulging without stenosis

L2-3: Disc bulging and facet degeneration with mild spinal stenosis

L3-4: Moderate disc bulging. Bilateral facet degeneration. Mild
spinal stenosis

L4-5: Advanced facet degeneration. Soft tissue detail in the canal
is limited by streak artifact and patient size. There is suggestion
of a moderately large central disc protrusion causing spinal
stenosis. Subarticular stenosis bilaterally

L5-S1: PLIF with solid fusion.  Negative for stenosis.
IMPRESSION: Mild spinal stenosis L2-3 and L3-4

Advanced facet degeneration L4-5 with mild anterolisthesis. Limited
soft tissue detail in the canal however there is suggestion of a
moderately large central disc protrusion causing significant spinal
stenosis. MRI would be more accurate and assessment of the spinal
canal.

PLIF L5-S1 with solid fusion.

## 2020-04-12 ENCOUNTER — Other Ambulatory Visit: Payer: Self-pay

## 2020-04-12 ENCOUNTER — Encounter: Payer: Self-pay | Admitting: Internal Medicine

## 2020-04-12 ENCOUNTER — Ambulatory Visit (INDEPENDENT_AMBULATORY_CARE_PROVIDER_SITE_OTHER): Payer: Commercial Managed Care - PPO | Admitting: Internal Medicine

## 2020-04-12 VITALS — BP 188/107 | HR 73 | Ht 68.5 in | Wt 276.3 lb

## 2020-04-12 DIAGNOSIS — I10 Essential (primary) hypertension: Secondary | ICD-10-CM | POA: Diagnosis not present

## 2020-04-12 DIAGNOSIS — Z720 Tobacco use: Secondary | ICD-10-CM | POA: Diagnosis not present

## 2020-04-12 DIAGNOSIS — M5416 Radiculopathy, lumbar region: Secondary | ICD-10-CM | POA: Diagnosis not present

## 2020-04-12 DIAGNOSIS — G894 Chronic pain syndrome: Secondary | ICD-10-CM

## 2020-04-12 MED ORDER — AMLODIPINE BESYLATE 5 MG PO TABS: 5.0000 mg | ORAL_TABLET | Freq: Every day | ORAL | 3 refills | Status: DC

## 2020-04-12 NOTE — Assessment & Plan Note (Signed)
Patient was advised to do back exercises walk daily and try to lose weight.

## 2020-04-12 NOTE — Assessment & Plan Note (Signed)
-   I instructed the patient to stop smoking and provided them with smoking cessation materials.  - I informed the patient that smoking puts them at increased risk for cancer, COPD, hypertension, and more.  - Informed the patient to seek help if they begin to have trouble breathing, develop chest pain, start to cough up blood, feel faint, or pass out.  

## 2020-04-12 NOTE — Progress Notes (Signed)
Established Patient Office Visit  Subjective:  Patient ID: Jake Kirby, male    DOB: 05/09/1966  Age: 54 y.o. MRN: [id number]  CC:  Chief Complaint  Patient presents with   Hypertension    Patient is here today for a check on his blood pressure. Jake Kirby states he has taken his blood pressure medication today but has been under alot of stress.    HPI  Jake Kirby presents for a blood pressure check. He is sleepy today. He states that he took his blood pressure medication this morning but is under a lot of stress because he recently lost his nephew. His back pain comes and goes and radiates down his legs. He takes BCs and Ibuprofen for this. He states that his weight goes up and down.  The patient has sleep apnea. He has already gone through sleep testing but does not have a CPAP machine.  The patient has not received the COVID-19 vaccine. His wife has. Got the shot  Past Medical History:  Diagnosis Date   Hypertension     Past Surgical History:  Procedure Laterality Date   BACK SURGERY     COLONOSCOPY WITH PROPOFOL N/A 02/12/2019   Procedure: COLONOSCOPY WITH PROPOFOL;  Surgeon: Virgel Manifold, MD;  Location: [facility] ENDOSCOPY;  Service: Endoscopy;  Laterality: N/A;    History reviewed. No pertinent family history.  Social History   Socioeconomic History   Marital status: Married    Spouse name: Not on file   Number of children: Not on file   Years of education: Not on file   Highest education level: Not on file  Occupational History   Not on file  Tobacco Use   Smoking status: Current Some Day Smoker    Types: Cigarettes   Smokeless tobacco: Never Used  Vaping Use   Vaping Use: Never used  Substance and Sexual Activity   Alcohol use: Yes    Alcohol/week: 5.0 standard drinks    Types: 5 Cans of beer per week   Drug use: No   Sexual activity: Not on file  Other Topics Concern   Not on file  Social History Narrative   Not on file    Social Determinants of Health   Financial Resource Strain:    Difficulty of Paying Living Expenses: Not on file  Food Insecurity:    Worried About [place] in the Last Year: Not on file   Ran Out of Food in the Last Year: Not on file  Transportation Needs:    Lack of Transportation (Medical): Not on file   Lack of Transportation (Non-Medical): Not on file  Physical Activity:    Days of Exercise per Week: Not on file   Minutes of Exercise per Session: Not on file  Stress:    Feeling of Stress : Not on file  Social Connections:    Frequency of Communication with Friends and Family: Not on file   Frequency of Social Gatherings with Friends and Family: Not on file   Attends Religious Services: Not on file   Active Member of Clubs or Organizations: Not on file   Attends Archivist Meetings: Not on file   Marital Status: Not on file  Intimate Partner Violence:    Fear of Current or Ex-Partner: Not on file   Emotionally Abused: Not on file   Physically Abused: Not on file   Sexually Abused: Not on file     Current Outpatient Medications:  Aspirin-Caffeine (BC FAST PAIN RELIEF ARTHRITIS) 1000-65 MG PACK, Take by mouth., Disp: , Rfl:    ibuprofen (ADVIL,MOTRIN) 800 MG tablet, Take 800 mg by mouth every 8 (eight) hours as needed., Disp: , Rfl:    lisinopril (PRINIVIL,ZESTRIL) 20 MG tablet, Take 20 mg by mouth daily., Disp: , Rfl:    amLODipine (NORVASC) 5 MG tablet, Take 1 tablet (5 mg total) by mouth daily., Disp: 90 tablet, Rfl: 3   No Known Allergies  ROS Review of Systems  Constitutional: Unexpected weight change: weight fluctuates.       Sleepy today.  HENT: Negative.   Eyes: Negative.   Respiratory:       Sleep apnea.  Cardiovascular: Negative.   Gastrointestinal: Negative.   Endocrine: Negative.   Genitourinary: Negative.   Musculoskeletal: Positive for back pain (radiates down legs).  Skin: Negative.    Allergic/Immunologic: Negative.   Neurological: Negative.   Hematological: Negative.   Psychiatric/Behavioral:       Stressed, recently lost his nephew  All other systems reviewed and are negative.     Objective:    Physical Exam Vitals reviewed.  Constitutional:      Appearance: Normal appearance. He is obese.  HENT:     Mouth/Throat:     Mouth: Mucous membranes are moist.  Eyes:     Pupils: Pupils are equal, round, and reactive to light.  Neck:     Vascular: No carotid bruit.  Cardiovascular:     Rate and Rhythm: Normal rate and regular rhythm.     Pulses: Normal pulses.     Heart sounds: Normal heart sounds.  Pulmonary:     Effort: Pulmonary effort is normal.     Breath sounds: Normal breath sounds.  Abdominal:     General: Bowel sounds are normal.     Palpations: Abdomen is soft. There is no hepatomegaly, splenomegaly or mass.     Tenderness: There is no abdominal tenderness.     Hernia: No hernia is present.  Musculoskeletal:     Cervical back: Neck supple.     Right lower leg: No edema.     Left lower leg: No edema.  Skin:    Findings: No rash.  Neurological:     Mental Status: He is alert and oriented to person, place, and time.     Motor: No weakness.  Psychiatric:        Mood and Affect: Mood normal.        Behavior: Behavior normal.     BP (!) 188/107    Pulse 73    Ht 5' 8.5" (1.74 m)    Wt 276 lb 4.8 oz (125.3 kg)    BMI 41.40 kg/m  Wt Readings from Last 3 Encounters:  04/12/20 276 lb 4.8 oz (125.3 kg)  02/12/19 260 lb (117.9 kg)  09/03/18 249 lb (112.9 kg)     Health Maintenance Due  Topic Date Due   Hepatitis C Screening  Never done   COVID-19 Vaccine (1) Never done   HIV Screening  Never done   TETANUS/TDAP  Never done   INFLUENZA VACCINE  Never done    There are no preventive care reminders to display for this patient.  No results found for: TSH Lab Results  Component Value Date   WBC 8.7 04/01/2015   HGB 13.6 04/01/2015    HCT 42.2 04/01/2015   MCV 84.7 04/01/2015   PLT 317 04/01/2015   Lab Results  Component Value Date   NA  142 04/01/2015   K 3.7 04/01/2015   CO2 29 04/01/2015   GLUCOSE 112 (H) 04/01/2015   BUN 16 04/01/2015   CREATININE 0.92 04/01/2015   BILITOT 0.6 04/01/2015   ALKPHOS 68 04/01/2015   AST 18 04/01/2015   ALT 22 04/01/2015   PROT 8.1 04/01/2015   ALBUMIN 4.0 04/01/2015   CALCIUM 9.0 04/01/2015   ANIONGAP 8 04/01/2015   No results found for: CHOL No results found for: HDL No results found for: LDLCALC No results found for: TRIG No results found for: CHOLHDL No results found for: HGBA1C    Assessment & Plan:   Problem List Items Addressed This Visit      Cardiovascular and Mediastinum   Essential hypertension - Primary    - I encouraged the patient to lose weight.  - I educated them on making healthy dietary choices including eating more fruits and vegetables and less fried foods. - I encouraged the patient to exercise more, and educated on the benefits of exercise including weight loss, diabetes prevention, and hypertension prevention.        Relevant Medications   amLODipine (NORVASC) 5 MG tablet     Nervous and Auditory   Lumbar radiculopathy    Patient was advised to do back exercises walk daily and try to lose weight.        Other   Chronic pain syndrome    Patient was advised to try to lose weight.      Tobacco abuse    - I instructed the patient to stop smoking and provided them with smoking cessation materials.  - I informed the patient that smoking puts them at increased risk for cancer, COPD, hypertension, and more.  - Informed the patient to seek help if they begin to have trouble breathing, develop chest pain, start to cough up blood, feel faint, or pass out.           Meds ordered this encounter  Medications   amLODipine (NORVASC) 5 MG tablet    Sig: Take 1 tablet (5 mg total) by mouth daily.    Dispense:  90 tablet    Refill:  3     provided the patient with Bystolic samples. for 4 wk's 1 tab 5 mg daily  Follow-up: Return in about 4 weeks (around 05/10/2020).    By signing my name below, I, De Burrs, attest that this documentation has been prepared under the direction and in the presence of Cletis Athens, MD. Electronically Signed: De Burrs, Medical Scribe. 04/12/20. 2:40 PM.  I personally performed the services described in this documentation, which was SCRIBED in my presence. The recorded information has been reviewed and considered accurate. It has been edited as necessary during review. Cletis Athens, MD

## 2020-04-12 NOTE — Assessment & Plan Note (Signed)
Patient was advised to try to lose weight 

## 2020-04-12 NOTE — Assessment & Plan Note (Signed)
-   I encouraged the patient to lose weight.  - I educated them on making healthy dietary choices including eating more fruits and vegetables and less fried foods. - I encouraged the patient to exercise more, and educated on the benefits of exercise including weight loss, diabetes prevention, and hypertension prevention.   

## 2020-04-21 DIAGNOSIS — I639 Cerebral infarction, unspecified: Secondary | ICD-10-CM

## 2020-04-21 HISTORY — DX: Cerebral infarction, unspecified: I63.9

## 2020-04-27 ENCOUNTER — Inpatient Hospital Stay: Payer: Commercial Managed Care - PPO

## 2020-04-27 ENCOUNTER — Emergency Department: Payer: Commercial Managed Care - PPO

## 2020-04-27 ENCOUNTER — Inpatient Hospital Stay
Admission: EM | Admit: 2020-04-27 | Discharge: 2020-04-28 | DRG: 101 | Disposition: A | Discharge status: Other Acute Inpatient Hospital | Payer: Commercial Managed Care - PPO | Attending: Internal Medicine | Admitting: Internal Medicine

## 2020-04-27 DIAGNOSIS — Z9119 Patient's noncompliance with other medical treatment and regimen: Secondary | ICD-10-CM | POA: Diagnosis not present

## 2020-04-27 DIAGNOSIS — Z20822 Contact with and (suspected) exposure to covid-19: Secondary | ICD-10-CM | POA: Diagnosis present

## 2020-04-27 DIAGNOSIS — F17210 Nicotine dependence, cigarettes, uncomplicated: Secondary | ICD-10-CM | POA: Diagnosis present

## 2020-04-27 DIAGNOSIS — G894 Chronic pain syndrome: Secondary | ICD-10-CM | POA: Diagnosis present

## 2020-04-27 DIAGNOSIS — Z6841 Body Mass Index (BMI) 40.0 and over, adult: Secondary | ICD-10-CM | POA: Diagnosis not present

## 2020-04-27 DIAGNOSIS — G4733 Obstructive sleep apnea (adult) (pediatric): Secondary | ICD-10-CM | POA: Diagnosis present

## 2020-04-27 DIAGNOSIS — D1779 Benign lipomatous neoplasm of other sites: Secondary | ICD-10-CM | POA: Diagnosis present

## 2020-04-27 DIAGNOSIS — G40901 Epilepsy, unspecified, not intractable, with status epilepticus: Principal | ICD-10-CM | POA: Diagnosis present

## 2020-04-27 DIAGNOSIS — I10 Essential (primary) hypertension: Secondary | ICD-10-CM | POA: Diagnosis present

## 2020-04-27 DIAGNOSIS — Z716 Tobacco abuse counseling: Secondary | ICD-10-CM | POA: Diagnosis not present

## 2020-04-27 DIAGNOSIS — Z8673 Personal history of transient ischemic attack (TIA), and cerebral infarction without residual deficits: Secondary | ICD-10-CM

## 2020-04-27 DIAGNOSIS — Z79899 Other long term (current) drug therapy: Secondary | ICD-10-CM | POA: Diagnosis not present

## 2020-04-27 DIAGNOSIS — R4182 Altered mental status, unspecified: Secondary | ICD-10-CM

## 2020-04-27 DIAGNOSIS — Z981 Arthrodesis status: Secondary | ICD-10-CM

## 2020-04-27 DIAGNOSIS — I6389 Other cerebral infarction: Secondary | ICD-10-CM

## 2020-04-27 DIAGNOSIS — R7303 Prediabetes: Secondary | ICD-10-CM | POA: Diagnosis not present

## 2020-04-27 DIAGNOSIS — R569 Unspecified convulsions: Secondary | ICD-10-CM

## 2020-04-27 DIAGNOSIS — W19XXXA Unspecified fall, initial encounter: Secondary | ICD-10-CM | POA: Diagnosis present

## 2020-04-27 HISTORY — DX: Other cerebral infarction: I63.89

## 2020-04-27 LAB — CBC
HCT: 40.9 % (ref 39.0–52.0)
HCT: 40.1 % (ref 39.0–52.0)
Hemoglobin: 13.3 g/dL (ref 13.0–17.0)
Hemoglobin: 12.9 g/dL — ABNORMAL LOW (ref 13.0–17.0)
MCH: 28 pg (ref 26.0–34.0)
MCH: 27.9 pg (ref 26.0–34.0)
MCHC: 32.5 g/dL (ref 30.0–36.0)
MCHC: 32.2 g/dL (ref 30.0–36.0)
MCV: 86.1 fL (ref 80.0–100.0)
MCV: 86.8 fL (ref 80.0–100.0)
Platelets: 188 10*3/uL (ref 150–400)
Platelets: 226 10*3/uL (ref 150–400)
RBC: 4.75 MIL/uL (ref 4.22–5.81)
RBC: 4.62 MIL/uL (ref 4.22–5.81)
RDW: 13.1 % (ref 11.5–15.5)
RDW: 12.9 % (ref 11.5–15.5)
WBC: 5.8 10*3/uL (ref 4.0–10.5)
WBC: 4.9 10*3/uL (ref 4.0–10.5)
nRBC: 0 % (ref 0.0–0.2)
nRBC: 0 % (ref 0.0–0.2)

## 2020-04-27 LAB — COMPREHENSIVE METABOLIC PANEL
ALT: 16 U/L (ref 0–44)
AST: 23 U/L (ref 15–41)
Albumin: 3.7 g/dL (ref 3.5–5.0)
Alkaline Phosphatase: 59 U/L (ref 38–126)
Anion gap: 12 (ref 5–15)
BUN: 20 mg/dL (ref 6–20)
CO2: 25 mmol/L (ref 22–32)
Calcium: 8.6 mg/dL — ABNORMAL LOW (ref 8.9–10.3)
Chloride: 102 mmol/L (ref 98–111)
Creatinine, Ser: 1.19 mg/dL (ref 0.61–1.24)
GFR calc non Af Amer: >60 mL/min (ref >60)
Glucose, Bld: 114 mg/dL — ABNORMAL HIGH (ref 70–99)
Potassium: 3.9 mmol/L (ref 3.5–5.1)
Sodium: 139 mmol/L (ref 135–145)
Total Bilirubin: 0.9 mg/dL (ref 0.3–1.2)
Total Protein: 6.7 g/dL (ref 6.5–8.1)

## 2020-04-27 LAB — MAGNESIUM: Magnesium: 2.1 mg/dL (ref 1.7–2.4)

## 2020-04-27 LAB — GLUCOSE, CAPILLARY: Glucose-Capillary: 110 mg/dL — ABNORMAL HIGH (ref 70–99)

## 2020-04-27 LAB — ETHANOL: Alcohol, Ethyl (B): <10 mg/dL (ref <10)

## 2020-04-27 LAB — PROTIME-INR
INR: 1 (ref 0.8–1.2)
Prothrombin Time: 12.5 seconds (ref 11.4–15.2)

## 2020-04-27 LAB — RESPIRATORY PANEL BY RT PCR (FLU A&B, COVID)
Influenza A by PCR: NEGATIVE
Influenza B by PCR: NEGATIVE
SARS Coronavirus 2 by RT PCR: NEGATIVE

## 2020-04-27 LAB — CREATININE, SERUM
Creatinine, Ser: 1.02 mg/dL (ref 0.61–1.24)
GFR calc non Af Amer: >60 mL/min (ref >60)

## 2020-04-27 LAB — APTT: aPTT: 30 seconds (ref 24–36)

## 2020-04-27 MED ORDER — LORAZEPAM 2 MG/ML IJ SOLN
2.0000 mg | Freq: Once | INTRAMUSCULAR | Status: AC
  Administered 2020-04-27: 2 mg via INTRAVENOUS

## 2020-04-27 MED ORDER — ACETAMINOPHEN 325 MG PO TABS: 650.0000 mg | ORAL_TABLET | ORAL | Status: DC | PRN

## 2020-04-27 MED ORDER — NEBIVOLOL HCL 5 MG PO TABS
5.0000 mg | ORAL_TABLET | Freq: Every day | ORAL | Status: DC
  Filled 2020-04-27: qty 1

## 2020-04-27 MED ORDER — LISINOPRIL 10 MG PO TABS: 20.0000 mg | ORAL_TABLET | Freq: Every day | ORAL | Status: DC

## 2020-04-27 MED ORDER — GADOBUTROL 1 MMOL/ML IV SOLN
10.0000 mL | Freq: Once | INTRAVENOUS | Status: AC | PRN
  Administered 2020-04-27: 10 mL via INTRAVENOUS

## 2020-04-27 MED ORDER — LORAZEPAM 2 MG/ML IJ SOLN
1.0000 mg | INTRAMUSCULAR | Status: DC | PRN
  Administered 2020-04-27 – 2020-04-28 (×3): 2 mg via INTRAVENOUS
  Filled 2020-04-27 (×5): qty 1

## 2020-04-27 MED ORDER — LEVETIRACETAM IN NACL 500 MG/100ML IV SOLN
500.0000 mg | Freq: Once | INTRAVENOUS | Status: AC
  Administered 2020-04-27: 500 mg via INTRAVENOUS
  Filled 2020-04-27: qty 100

## 2020-04-27 MED ORDER — LEVETIRACETAM IN NACL 1500 MG/100ML IV SOLN
1500.0000 mg | Freq: Once | INTRAVENOUS | Status: AC
  Administered 2020-04-27: 1500 mg via INTRAVENOUS
  Filled 2020-04-27: qty 100

## 2020-04-27 MED ORDER — SODIUM CHLORIDE 0.9 % IV SOLN
1000.0000 mg | Freq: Once | INTRAVENOUS | Status: AC
  Administered 2020-04-27: 1000 mg via INTRAVENOUS
  Filled 2020-04-27: qty 20

## 2020-04-27 MED ORDER — LEVETIRACETAM IN NACL 1000 MG/100ML IV SOLN
1000.0000 mg | Freq: Two times a day (BID) | INTRAVENOUS | Status: DC
  Administered 2020-04-28: 1000 mg via INTRAVENOUS
  Filled 2020-04-27: qty 100

## 2020-04-27 MED ORDER — ENOXAPARIN SODIUM 40 MG/0.4ML ~~LOC~~ SOLN: 40.0000 mg | SUBCUTANEOUS | Status: DC

## 2020-04-27 MED ORDER — ONDANSETRON HCL 4 MG PO TABS: 4.0000 mg | ORAL_TABLET | Freq: Four times a day (QID) | ORAL | Status: DC | PRN

## 2020-04-27 MED ORDER — HYDRALAZINE HCL 20 MG/ML IJ SOLN
10.0000 mg | INTRAMUSCULAR | Status: DC | PRN
  Administered 2020-04-27: 10 mg via INTRAVENOUS
  Filled 2020-04-27: qty 1

## 2020-04-27 MED ORDER — ACETAMINOPHEN 650 MG RE SUPP: 650.0000 mg | RECTAL | Status: DC | PRN

## 2020-04-27 MED ORDER — SODIUM CHLORIDE 0.9 % IV SOLN
75.0000 mL/h | INTRAVENOUS | Status: DC
  Administered 2020-04-27: 75 mL/h via INTRAVENOUS

## 2020-04-27 MED ORDER — ONDANSETRON HCL 4 MG/2ML IJ SOLN: 4.0000 mg | Freq: Four times a day (QID) | INTRAMUSCULAR | Status: DC | PRN

## 2020-04-27 MED ORDER — SODIUM CHLORIDE 0.9 % IV SOLN
200.0000 mg | Freq: Three times a day (TID) | INTRAVENOUS | Status: DC
  Filled 2020-04-27 (×2): qty 4

## 2020-04-27 MED ORDER — SENNOSIDES-DOCUSATE SODIUM 8.6-50 MG PO TABS: 1.0000 | ORAL_TABLET | Freq: Every evening | ORAL | Status: DC | PRN

## 2020-04-27 NOTE — ED Notes (Signed)
Pt noted to have another seizure with similar presentation to previous seizure activity. Ativan administered.

## 2020-04-27 NOTE — ED Notes (Signed)
Pt noted to have 2 more episodes of seizure like activity since Ativan administration. MD paged

## 2020-04-27 NOTE — ED Provider Notes (Signed)
[facility] Emergency Department Provider Note  ____________________________________________   First MD Initiated Contact with Patient 04/27/20 1425     (approximate)  I have reviewed the triage vital signs and the nursing notes.   HISTORY  Chief Complaint Seizures    HPI Jake Kirby is a 54 y.o. male with history of hypertension here with altered mental status.  The patient arrives via EMS.  Per report, just prior to arrival, the patient fell to the ground and had what was initially thought to be a syncopal episode.  On EMS arrival, however, the patient was confused but responsive.  He then had a witnessed, generalized, less than 1 to 2-minute seizure.  He was given a total of 4 mg of Versed in route and has continued to have occasional seizures despite this.  He has had leftward gaze deviation during the episodes, along with generalized tonic-clonic activity.  He then is drowsy but responsive.  No known history of seizures.  Family denies any recent trauma.  On my assessment, the patient is somnolent after receiving Versed and 2 mg of Ativan upon arrival.  He denies any drug or alcohol use.  Denies any headaches today.  Remainder of history limited due to drowsiness from medications.   Level 5 caveat invoked as remainder of history, ROS, and physical exam limited due to patient's drowsiness.        Past Medical History:  Diagnosis Date  . Hypertension     Patient Active Problem List   Diagnosis Date Noted  . Seizure ([place]) 04/27/2020  . Essential hypertension 04/12/2020  . Tobacco abuse 04/12/2020  . History of lumbar fusion (L5-S1) 06/16/2018  . Lumbar radiculopathy 06/16/2018  . Lumbar facet arthropathy 06/16/2018  . Chronic bilateral low back pain with bilateral sciatica 06/16/2018  . Chronic pain syndrome 06/16/2018    Past Surgical History:  Procedure Laterality Date  . BACK SURGERY    . COLONOSCOPY WITH PROPOFOL N/A 02/12/2019    Procedure: COLONOSCOPY WITH PROPOFOL;  Surgeon: Virgel Manifold, MD;  Location: [facility] ENDOSCOPY;  Service: Endoscopy;  Laterality: N/A;    Prior to Admission medications   Medication Sig Start Date End Date Taking? Authorizing Provider  lisinopril (PRINIVIL,ZESTRIL) 20 MG tablet Take 20 mg by mouth daily. 07/25/18  Yes [provider]  nebivolol (BYSTOLIC) 5 MG tablet Take 5 mg by mouth daily.   Yes [provider]    Allergies Patient has no known allergies.  No family history on file.  Social History Social History   Tobacco Use  . Smoking status: Current Some Day Smoker    Types: Cigarettes  . Smokeless tobacco: Never Used  Vaping Use  . Vaping Use: Never used  Substance Use Topics  . Alcohol use: Yes    Alcohol/week: 5.0 standard drinks    Types: 5 Cans of beer per week  . Drug use: No    Review of Systems  Review of Systems  Unable to perform ROS: Mental status change     ____________________________________________  PHYSICAL EXAM:      VITAL SIGNS: ED Triage Vitals  Enc Vitals Group     BP 04/27/20 1428 (!) 185/110     Pulse Rate 04/27/20 1428 68     Resp 04/27/20 1428 20     Temp 04/27/20 1428 98 F (36.7 C)     Temp Source 04/27/20 1428 Oral     SpO2 04/27/20 1428 99 %     Weight --  Height --      Head Circumference --      Peak Flow --      Pain Score 04/27/20 1430 0     Pain Loc --      Pain Edu? --      Excl. in [place]? --      Physical Exam Vitals and nursing note reviewed.  Constitutional:      General: He is not in acute distress.    Appearance: He is well-developed.  HENT:     Head: Normocephalic and atraumatic.  Eyes:     Conjunctiva/sclera: Conjunctivae normal.  Cardiovascular:     Rate and Rhythm: Normal rate and regular rhythm.     Heart sounds: Normal heart sounds.  Pulmonary:     Effort: Pulmonary effort is normal. No respiratory distress.     Breath sounds: No wheezing.  Abdominal:     General:  There is no distension.  Musculoskeletal:     Cervical back: Neck supple.  Skin:    General: Skin is warm.     Capillary Refill: Capillary refill takes less than 2 seconds.     Findings: No rash.  Neurological:     Mental Status: He is alert. He is disoriented.     Motor: No abnormal muscle tone.     Comments: Drowsy, falls asleep during exam. Follows commands, MAE with at least antigravity strength. Normal tone, no tremor noted. PERRL. No apparent CN deficits.       ____________________________________________   LABS (all labs ordered are listed, but only abnormal results are displayed)  Labs Reviewed  COMPREHENSIVE METABOLIC PANEL - Abnormal; Notable for the following components:      Result Value   Glucose, Bld 114 (*)    Calcium 8.6 (*)    All other components within normal limits  CBC - Abnormal; Notable for the following components:   Hemoglobin 12.9 (*)    All other components within normal limits  RESPIRATORY PANEL BY RT PCR (FLU A&B, COVID)  CBC  ETHANOL  PROTIME-INR  APTT  CREATININE, SERUM  URINE DRUG SCREEN, QUALITATIVE ([facility] ONLY)  HIV ANTIBODY (ROUTINE TESTING W REFLEX)  URINALYSIS, COMPLETE (UACMP) WITH MICROSCOPIC  CBG MONITORING, ED    ____________________________________________  EKG: Normal sinus rhythm, ventricular rate 69.  QRS 118, QTc 461.  Nonspecific T wave changes.  No acute ST elevations or depressions.  No EKG evidence of acute ischemia or infarct. ________________________________________  RADIOLOGY All imaging, including plain films, CT scans, and ultrasounds, independently reviewed by me, and interpretations confirmed via formal radiology reads.  ED MD interpretation:   Chest x-ray: Cardiomegaly, no acute abnormality CT head: No acute abnormality, lipoma noted along corpus callosum  Official radiology report(s): CT Head Wo Contrast  Result Date: 04/27/2020 CLINICAL DATA:  Seizure EXAM: CT HEAD WITHOUT CONTRAST TECHNIQUE: Contiguous  axial images were obtained from the base of the skull through the vertex without intravenous contrast. COMPARISON:  None. FINDINGS: Brain: There is no acute intracranial hemorrhage, mass effect, or edema. No acute appearing loss of gray-white differentiation. There is a small area of encephalomalacia involving the left parietal lobe. There is no extra-axial fluid collection. A lipoma is noted along the corpus callosum. Ventricles and sulci are within normal limits in size and configuration. Vascular: No hyperdense vessel. There is intracranial atherosclerotic calcification at the skull base. Skull: Calvarium is unremarkable. Sinuses/Orbits: No acute finding. Other: None. IMPRESSION: No acute intracranial hemorrhage, mass effect, or evidence of acute infarction. Small area  of left parietal encephalomalacia. Electronically Signed   By: Macy Mis M.D.   On: 04/27/2020 15:08   DG Chest Port 1 View  Result Date: 04/27/2020 CLINICAL DATA:  Seizure EXAM: PORTABLE CHEST 1 VIEW COMPARISON:  None. FINDINGS: Heart size is mildly enlarged. No focal airspace consolidation, pleural effusion, or pneumothorax. Osseous structures within normal limits. IMPRESSION: Mild cardiomegaly. No active pulmonary disease. Electronically Signed   By: Davina Poke D.O.   On: 04/27/2020 14:42    ____________________________________________  PROCEDURES   Procedure(s) performed (including Critical Care):  .Critical Care Performed by: Duffy Bruce, MD Authorized by: Duffy Bruce, MD   Critical care provider statement:    Critical care time (minutes):  35   Critical care time was exclusive of:  Separately billable procedures and treating other patients and teaching time   Critical care was necessary to treat or prevent imminent or life-threatening deterioration of the following conditions:  Cardiac failure, circulatory failure and CNS failure or compromise   Critical care was time spent personally by me on the  following activities:  Development of treatment plan with patient or surrogate, discussions with consultants, evaluation of patient's response to treatment, examination of patient, obtaining history from patient or surrogate, ordering and performing treatments and interventions, ordering and review of laboratory studies, ordering and review of radiographic studies, pulse oximetry, re-evaluation of patient's condition and review of old charts   I assumed direction of critical care for this patient from another provider in my specialty: no      ____________________________________________  INITIAL IMPRESSION / MDM / [place] / ED COURSE  As part of my medical decision making, I reviewed the following data within the [place] notes reviewed and incorporated, Old chart reviewed, Notes from prior ED visits, and [place] Controlled Substance Database       *KENARD MORAWSKI was evaluated in Emergency Department on 04/27/2020 for the symptoms described in the history of present illness. He was evaluated in the context of the global COVID-19 pandemic, which necessitated consideration that the patient might be at risk for infection with the SARS-CoV-2 virus that causes COVID-19. Institutional protocols and algorithms that pertain to the evaluation of patients at risk for COVID-19 are in a state of rapid change based on information released by regulatory bodies including the CDC and federal and state organizations. These policies and algorithms were followed during the patient's care in the ED.  Some ED evaluations and interventions may be delayed as a result of limited staffing during the pandemic.*     Medical Decision Making: 54 year old male here with new onset seizures.  On arrival, patient given IV Ativan for ongoing generalized seizure activity.  Patient noted to have unilateral right arm shaking prior to generalization.  Patient taken for stat CT head which shows no  evidence acute abnormality.  Of note, patient has a lipoma along the corpus callosum.  No history of seizures.  Lab work is overall unremarkable.  He denies any drug use.  No apparent significant electrolyte abnormalities.  Case discussed with neurology, will obtain stat MRI.  Patient loaded with IV Keppra.  Given that his seizure-like activity has stopped, okay for routine EEG per neurology.  ____________________________________________  FINAL CLINICAL IMPRESSION(S) / ED DIAGNOSES  Final diagnoses:  Seizures ([place])     MEDICATIONS GIVEN DURING THIS VISIT:  Medications  lisinopril (ZESTRIL) tablet 20 mg (has no administration in time range)  nebivolol (BYSTOLIC) tablet 5 mg (has no administration in  time range)  0.9 %  sodium chloride infusion (75 mL/hr Intravenous New Bag/Given 04/27/20 1817)  enoxaparin (LOVENOX) injection 40 mg (has no administration in time range)  LORazepam (ATIVAN) injection 1-2 mg (2 mg Intravenous Given 04/27/20 1935)  acetaminophen (TYLENOL) tablet 650 mg (has no administration in time range)    Or  acetaminophen (TYLENOL) suppository 650 mg (has no administration in time range)  senna-docusate (Senokot-S) tablet 1 tablet (has no administration in time range)  ondansetron (ZOFRAN) tablet 4 mg (has no administration in time range)    Or  ondansetron (ZOFRAN) injection 4 mg (has no administration in time range)  hydrALAZINE (APRESOLINE) injection 10 mg (has no administration in time range)  levETIRAcetam (KEPPRA) IVPB 1000 mg/100 mL premix (has no administration in time range)  LORazepam (ATIVAN) injection 2 mg (2 mg Intravenous Given 04/27/20 1430)  levETIRAcetam (KEPPRA) IVPB 1500 mg/ 100 mL premix (0 mg Intravenous Stopped 04/27/20 1501)  levETIRAcetam (KEPPRA) IVPB 500 mg/100 mL premix (0 mg Intravenous Stopped 04/27/20 1558)     ED Discharge Orders    None       Note:  This document was prepared using Dragon voice recognition software and may include  unintentional dictation errors.   Duffy Bruce, MD 04/27/20 [phone]

## 2020-04-27 NOTE — ED Triage Notes (Signed)
Pt presents to the [facility] via EMS from home with c/o new onset recurrent seizures. EMS states that pt had near syncopal episode then had a seizure. Pt's wife notified EMS. EMS states that pt has had seizures every 30 seconds en route. Pt given 4mg  IV versed with no change in seizure activity.

## 2020-04-27 NOTE — ED Notes (Signed)
Pt noted to have several seizures since arrival to ED. Pt has left sided gaze with full body involvement that last ~30 seconds.

## 2020-04-27 NOTE — H&P (Signed)
History and Physical    Jake Kirby QIH:[id number] DOB: [birth year] DOA: 04/27/2020  PCP: Cletis Athens, MD  Patient coming from: Home    Chief Complaint: New onset seizure  HPI: Jake Kirby is a 54 y.o. male with medical history significant of obesity, hypertension, obstructive sleep apnea not on home CPAP, tobacco use who presents to the emergency department for evaluation of new onset seizure.  My evaluation patient is lethargic and postictal and unable to provide history.  Majority the history is gained by reading the medical record, speaking with ED provider, speaking with patient's wife at bedside.  Apparently the patient was in his normal state of health when he noted to his family that he did not quite feel right.  The family noticed his arm shaking and he fell to the ground.  Initial concern was for syncopal event.  EMS was called and the patient was confused and responsive.  The patient had a witnessed generalized 1 to 2-minute seizure.  Patient was given Versed with good abortive effect on route.  He continued to have seizures despite this.  Patient has no known history of drug or alcohol use.  No recent changes in medications.  History limited by postictal state and altered mentation  ED Course: On presentation patient underwent CAT scan demonstrating intracranial lipoma.  This is of unclear etiology.  Neurology was consulted with recommendations for admission.  Patient was loaded with intravenous Keppra 2000 mg and hospital service was called for admission.  Patient's vital signs remained stable during ER course  Review of Systems: Cannot be performed due to mental status  Past Medical History:  Diagnosis Date  . Hypertension     Past Surgical History:  Procedure Laterality Date  . BACK SURGERY    . COLONOSCOPY WITH PROPOFOL N/A 02/12/2019   Procedure: COLONOSCOPY WITH PROPOFOL;  Surgeon: Virgel Manifold, MD;  Location: [facility] ENDOSCOPY;  Service: Endoscopy;  Laterality:  N/A;     reports that he has been smoking cigarettes. He has never used smokeless tobacco. He reports current alcohol use of about 5.0 standard drinks of alcohol per week. He reports that he does not use drugs.  No Known Allergies  No family history on file. Family history of hypertension diabetes No known family history of epilepsy or seizure disorder  Prior to Admission medications   Medication Sig Start Date End Date Taking? Authorizing Provider  lisinopril (PRINIVIL,ZESTRIL) 20 MG tablet Take 20 mg by mouth daily. 07/25/18  Yes [provider]  nebivolol (BYSTOLIC) 5 MG tablet Take 5 mg by mouth daily.   Yes [provider]    Physical Exam: Vitals:   04/27/20 1615 04/27/20 1630 04/27/20 1645 04/27/20 1730  BP: (!) 188/102 (!) 172/111 (!) 158/98 (!) 170/96  Pulse: (!) 55 (!) 57 (!) 56 (!) 56  Resp: 17 15 17  (!) 69  Temp:      TempSrc:      SpO2: 100% 100% 100% 99%     Vitals:   04/27/20 1615 04/27/20 1630 04/27/20 1645 04/27/20 1730  BP: (!) 188/102 (!) 172/111 (!) 158/98 (!) 170/96  Pulse: (!) 55 (!) 57 (!) 56 (!) 56  Resp: 17 15 17  (!) 69  Temp:      TempSrc:      SpO2: 100% 100% 100% 99%   Constitutional: No acute distress.  Altered, lethargic Eyes: PERRL, lids and conjunctive are normal, leftward gaze deviation ENMT: Mucous membranes are dry. Posterior pharynx clear of any exudate  or lesions.poor dentition.  Neck: normal, supple, no masses, no thyromegaly Respiratory: clear to auscultation bilaterally, no wheezing, no crackles. Normal respiratory effort. No accessory muscle use.  Cardiovascular: Regular rate and rhythm, no murmurs / rubs / gallops. No extremity edema. 2+ pedal pulses. No carotid bruits.  Abdomen: Obese, nontender, nondistended, normal bowel sounds Musculoskeletal: no clubbing / cyanosis. No joint deformity upper and lower extremities. Good ROM, no contractures. Normal muscle tone.  Skin: no rashes, lesions, ulcers. No  induration Neurologic: Cranial nerves grossly intact.  Sensation intact.  Strength 5 out of 5 Psychiatric: Altered, lethargic, not oriented on my evaluation   Labs on Admission: I have personally reviewed following labs and imaging studies  CBC: Recent Labs  Lab 04/27/20 1428  WBC 5.8  HGB 13.3  HCT 40.9  MCV 86.1  PLT 981   Basic Metabolic Panel: Recent Labs  Lab 04/27/20 1428  NA 139  K 3.9  CL 102  CO2 25  GLUCOSE 114*  BUN 20  CREATININE 1.19  CALCIUM 8.6*   GFR: CrCl cannot be calculated (Unknown ideal weight.). Liver Function Tests: Recent Labs  Lab 04/27/20 1428  AST 23  ALT 16  ALKPHOS 59  BILITOT 0.9  PROT 6.7  ALBUMIN 3.7   No results for input(s): LIPASE, AMYLASE in the last 168 hours. No results for input(s): AMMONIA in the last 168 hours. Coagulation Profile: Recent Labs  Lab 04/27/20 1518  INR 1.0   Cardiac Enzymes: No results for input(s): CKTOTAL, CKMB, CKMBINDEX, TROPONINI in the last 168 hours. BNP (last 3 results) No results for input(s): PROBNP in the last 8760 hours. HbA1C: No results for input(s): HGBA1C in the last 72 hours. CBG: No results for input(s): GLUCAP in the last 168 hours. Lipid Profile: No results for input(s): CHOL, HDL, LDLCALC, TRIG, CHOLHDL, LDLDIRECT in the last 72 hours. Thyroid Function Tests: No results for input(s): TSH, T4TOTAL, FREET4, T3FREE, THYROIDAB in the last 72 hours. Anemia Panel: No results for input(s): VITAMINB12, FOLATE, FERRITIN, TIBC, IRON, RETICCTPCT in the last 72 hours. Urine analysis:    Component Value Date/Time   COLORURINE YELLOW (A) 03/07/2018 1107   APPEARANCEUR CLEAR (A) 03/07/2018 1107   LABSPEC 1.016 03/07/2018 1107   PHURINE 6.0 03/07/2018 1107   GLUCOSEU NEGATIVE 03/07/2018 1107   HGBUR NEGATIVE 03/07/2018 1107   BILIRUBINUR NEGATIVE 03/07/2018 1107   KETONESUR NEGATIVE 03/07/2018 1107   PROTEINUR NEGATIVE 03/07/2018 1107   NITRITE NEGATIVE 03/07/2018 1107    LEUKOCYTESUR NEGATIVE 03/07/2018 1107    Radiological Exams on Admission: CT Head Wo Contrast  Result Date: 04/27/2020 CLINICAL DATA:  Seizure EXAM: CT HEAD WITHOUT CONTRAST TECHNIQUE: Contiguous axial images were obtained from the base of the skull through the vertex without intravenous contrast. COMPARISON:  None. FINDINGS: Brain: There is no acute intracranial hemorrhage, mass effect, or edema. No acute appearing loss of gray-white differentiation. There is a small area of encephalomalacia involving the left parietal lobe. There is no extra-axial fluid collection. A lipoma is noted along the corpus callosum. Ventricles and sulci are within normal limits in size and configuration. Vascular: No hyperdense vessel. There is intracranial atherosclerotic calcification at the skull base. Skull: Calvarium is unremarkable. Sinuses/Orbits: No acute finding. Other: None. IMPRESSION: No acute intracranial hemorrhage, mass effect, or evidence of acute infarction. Small area of left parietal encephalomalacia. Electronically Signed   By: Macy Mis M.D.   On: 04/27/2020 15:08   DG Chest Port 1 View  Result Date: 04/27/2020 CLINICAL DATA:  Seizure EXAM: PORTABLE CHEST 1 VIEW COMPARISON:  None. FINDINGS: Heart size is mildly enlarged. No focal airspace consolidation, pleural effusion, or pneumothorax. Osseous structures within normal limits. IMPRESSION: Mild cardiomegaly. No active pulmonary disease. Electronically Signed   By: Davina Poke D.O.   On: 04/27/2020 14:42    EKG: Independently reviewed.  Sinus rhythm  Assessment/Plan Active Problems:   Seizure ([place])  New onset seizure Intracranial lipoma Patient was noted to have arm shaking activity followed by generalization of seizure at home Multiple witnessed tonic-clonic episodes by EMS Abortive therapy with Ativan was successful Loaded with Keppra in ED Neurology consulted from ED Unclear whether intracranial lipoma has any significance to  current presentation Plan: Admit inpatient Routine EEG MRI brain with and without contrast Keppra loading dose administered Keppra maintenance dose 1000 mg twice daily IV Seizure precautions Neurology consult, communicated with Dr. Curly Shores  Hypertension, poorly controlled Patient is on lisinopril and Bystolic at home Plan: Continue home lisinopril 20 mg daily Continue home Bystolic 5 mg daily IV hydralazine as needed  Obesity Last weight recorded 225 mg 1 week ago This complicates overall care  Obstructive sleep apnea Patient noncompliant with home CPAP Counseled patient  Tobacco use Counseled patient  DVT prophylaxis: Lovenox  code Status: Full Family Communication: Wife at bedside Disposition Plan: Anticipate return to previous home environment Consults called: Neurology- Dr Curly Shores Admission status: Inpatient, MedSurg   Sidney Ace MD [facility]  If 7PM-7AM, please contact night-coverage   04/27/2020, 5:46 PM

## 2020-04-27 NOTE — ED Notes (Signed)
Pt sleeping in bed with even, unlabored respirations. No seizure activity since Keppra administration.

## 2020-04-28 ENCOUNTER — Inpatient Hospital Stay (HOSPITAL_COMMUNITY): Payer: Commercial Managed Care - PPO

## 2020-04-28 ENCOUNTER — Inpatient Hospital Stay (HOSPITAL_COMMUNITY)
Admission: AD | Admit: 2020-04-28 | Discharge: 2020-05-02 | DRG: 100 | Disposition: A | Discharge status: Home / Self Care | Payer: Commercial Managed Care - PPO | Source: Other Acute Inpatient Hospital | Attending: Internal Medicine | Admitting: Internal Medicine

## 2020-04-28 ENCOUNTER — Inpatient Hospital Stay: Payer: Commercial Managed Care - PPO

## 2020-04-28 DIAGNOSIS — G928 Other toxic encephalopathy: Secondary | ICD-10-CM | POA: Diagnosis not present

## 2020-04-28 DIAGNOSIS — G40901 Epilepsy, unspecified, not intractable, with status epilepticus: Principal | ICD-10-CM | POA: Diagnosis present

## 2020-04-28 DIAGNOSIS — G4733 Obstructive sleep apnea (adult) (pediatric): Secondary | ICD-10-CM | POA: Diagnosis present

## 2020-04-28 DIAGNOSIS — R569 Unspecified convulsions: Secondary | ICD-10-CM | POA: Diagnosis not present

## 2020-04-28 DIAGNOSIS — F17210 Nicotine dependence, cigarettes, uncomplicated: Secondary | ICD-10-CM | POA: Diagnosis present

## 2020-04-28 DIAGNOSIS — G039 Meningitis, unspecified: Secondary | ICD-10-CM | POA: Diagnosis present

## 2020-04-28 DIAGNOSIS — T424X5A Adverse effect of benzodiazepines, initial encounter: Secondary | ICD-10-CM | POA: Diagnosis not present

## 2020-04-28 DIAGNOSIS — I10 Essential (primary) hypertension: Secondary | ICD-10-CM | POA: Diagnosis present

## 2020-04-28 DIAGNOSIS — Z981 Arthrodesis status: Secondary | ICD-10-CM

## 2020-04-28 DIAGNOSIS — Z23 Encounter for immunization: Secondary | ICD-10-CM | POA: Diagnosis not present

## 2020-04-28 DIAGNOSIS — Z6841 Body Mass Index (BMI) 40.0 and over, adult: Secondary | ICD-10-CM

## 2020-04-28 DIAGNOSIS — Z20822 Contact with and (suspected) exposure to covid-19: Secondary | ICD-10-CM | POA: Diagnosis present

## 2020-04-28 DIAGNOSIS — R7303 Prediabetes: Secondary | ICD-10-CM | POA: Diagnosis present

## 2020-04-28 LAB — CBC WITH DIFFERENTIAL/PLATELET
Abs Immature Granulocytes: 0.02 10*3/uL (ref 0.00–0.07)
Basophils Absolute: 0 10*3/uL (ref 0.0–0.1)
Basophils Relative: 1 %
Eosinophils Absolute: 0.1 10*3/uL (ref 0.0–0.5)
Eosinophils Relative: 2 %
HCT: 40.9 % (ref 39.0–52.0)
Hemoglobin: 13.3 g/dL (ref 13.0–17.0)
Immature Granulocytes: 0 %
Lymphocytes Relative: 22 %
Lymphs Abs: 1.2 10*3/uL (ref 0.7–4.0)
MCH: 28.2 pg (ref 26.0–34.0)
MCHC: 32.5 g/dL (ref 30.0–36.0)
MCV: 86.7 fL (ref 80.0–100.0)
Monocytes Absolute: 0.5 10*3/uL (ref 0.1–1.0)
Monocytes Relative: 9 %
Neutro Abs: 3.7 10*3/uL (ref 1.7–7.7)
Neutrophils Relative %: 66 %
Platelets: 222 10*3/uL (ref 150–400)
RBC: 4.72 MIL/uL (ref 4.22–5.81)
RDW: 13 % (ref 11.5–15.5)
WBC: 5.5 10*3/uL (ref 4.0–10.5)
nRBC: 0 % (ref 0.0–0.2)

## 2020-04-28 LAB — URINALYSIS, COMPLETE (UACMP) WITH MICROSCOPIC
Bacteria, UA: NONE SEEN
Bilirubin Urine: NEGATIVE
Glucose, UA: NEGATIVE mg/dL
Hgb urine dipstick: NEGATIVE
Ketones, ur: NEGATIVE mg/dL
Leukocytes,Ua: NEGATIVE
Nitrite: NEGATIVE
Protein, ur: NEGATIVE mg/dL
Specific Gravity, Urine: 1.009 (ref 1.005–1.030)
pH: 8 (ref 5.0–8.0)

## 2020-04-28 LAB — URINE DRUG SCREEN, QUALITATIVE (ARMC ONLY)
Amphetamines, Ur Screen: NOT DETECTED
Barbiturates, Ur Screen: NOT DETECTED
Benzodiazepine, Ur Scrn: POSITIVE — AB
Cannabinoid 50 Ng, Ur ~~LOC~~: NOT DETECTED
Cocaine Metabolite,Ur ~~LOC~~: NOT DETECTED
MDMA (Ecstasy)Ur Screen: NOT DETECTED
Methadone Scn, Ur: NOT DETECTED
Opiate, Ur Screen: NOT DETECTED
Phencyclidine (PCP) Ur S: NOT DETECTED
Tricyclic, Ur Screen: NOT DETECTED

## 2020-04-28 LAB — COMPREHENSIVE METABOLIC PANEL
ALT: 15 U/L (ref 0–44)
AST: 16 U/L (ref 15–41)
Albumin: 3.7 g/dL (ref 3.5–5.0)
Alkaline Phosphatase: 50 U/L (ref 38–126)
Anion gap: 7 (ref 5–15)
BUN: 14 mg/dL (ref 6–20)
CO2: 30 mmol/L (ref 22–32)
Calcium: 8.5 mg/dL — ABNORMAL LOW (ref 8.9–10.3)
Chloride: 104 mmol/L (ref 98–111)
Creatinine, Ser: 0.89 mg/dL (ref 0.61–1.24)
GFR calc non Af Amer: >60 mL/min (ref >60)
Glucose, Bld: 109 mg/dL — ABNORMAL HIGH (ref 70–99)
Potassium: 3.4 mmol/L — ABNORMAL LOW (ref 3.5–5.1)
Sodium: 141 mmol/L (ref 135–145)
Total Bilirubin: 0.6 mg/dL (ref 0.3–1.2)
Total Protein: 6.9 g/dL (ref 6.5–8.1)

## 2020-04-28 LAB — CSF CELL COUNT WITH DIFFERENTIAL
Eosinophils, CSF: 1 %
Eosinophils, CSF: 1 %
Lymphs, CSF: 48 %
Lymphs, CSF: 51 %
Monocyte-Macrophage-Spinal Fluid: 11 %
Monocyte-Macrophage-Spinal Fluid: 12 %
RBC Count, CSF: 358 /mm3 — ABNORMAL HIGH (ref 0–3)
RBC Count, CSF: 5068 /mm3 — ABNORMAL HIGH (ref 0–3)
Segmented Neutrophils-CSF: 36 %
Segmented Neutrophils-CSF: 40 %
Tube #: 1
Tube #: 4
WBC, CSF: 28 /mm3 (ref 0–5)
WBC, CSF: 5 /mm3 (ref 0–5)

## 2020-04-28 LAB — CBC
HCT: 45.4 % (ref 39.0–52.0)
Hemoglobin: 14.1 g/dL (ref 13.0–17.0)
MCH: 27.2 pg (ref 26.0–34.0)
MCHC: 31.1 g/dL (ref 30.0–36.0)
MCV: 87.6 fL (ref 80.0–100.0)
Platelets: 247 10*3/uL (ref 150–400)
RBC: 5.18 MIL/uL (ref 4.22–5.81)
RDW: 12.9 % (ref 11.5–15.5)
WBC: 5.6 10*3/uL (ref 4.0–10.5)
nRBC: 0 % (ref 0.0–0.2)

## 2020-04-28 LAB — HIV ANTIBODY (ROUTINE TESTING W REFLEX): HIV Screen 4th Generation wRfx: NONREACTIVE

## 2020-04-28 LAB — PATHOLOGIST SMEAR REVIEW

## 2020-04-28 LAB — PROTEIN AND GLUCOSE, CSF
Glucose, CSF: 70 mg/dL (ref 40–70)
Total  Protein, CSF: 58 mg/dL — ABNORMAL HIGH (ref 15–45)

## 2020-04-28 LAB — PHENYTOIN LEVEL, TOTAL: Phenytoin Lvl: 9.5 ug/mL — ABNORMAL LOW (ref 10.0–20.0)

## 2020-04-28 LAB — CREATININE, SERUM
Creatinine, Ser: 0.84 mg/dL (ref 0.61–1.24)
GFR, Estimated: >60 mL/min (ref >60)

## 2020-04-28 LAB — GLUCOSE, CAPILLARY: Glucose-Capillary: 101 mg/dL — ABNORMAL HIGH (ref 70–99)

## 2020-04-28 MED ORDER — SODIUM CHLORIDE 0.9 % IV SOLN: 300.0000 mg | Freq: Three times a day (TID) | INTRAVENOUS | Status: DC

## 2020-04-28 MED ORDER — SODIUM CHLORIDE 0.9 % IV SOLN
2000.0000 mg | Freq: Once | INTRAVENOUS | Status: AC
  Administered 2020-04-28: 2000 mg via INTRAVENOUS
  Filled 2020-04-28: qty 20

## 2020-04-28 MED ORDER — VALPROATE SODIUM 500 MG/5ML IV SOLN: 300.0000 mg | Freq: Four times a day (QID) | INTRAVENOUS | Status: DC

## 2020-04-28 MED ORDER — PHENYTOIN SODIUM 50 MG/ML IJ SOLN
100.0000 mg | Freq: Once | INTRAMUSCULAR | Status: AC
  Administered 2020-04-28: 100 mg via INTRAVENOUS
  Filled 2020-04-28: qty 2

## 2020-04-28 MED ORDER — ORAL CARE MOUTH RINSE: 15.0000 mL | Freq: Two times a day (BID) | OROMUCOSAL | Status: DC

## 2020-04-28 MED ORDER — DEXTROSE 5 % IV SOLN
10.0000 mg/kg | Freq: Three times a day (TID) | INTRAVENOUS | Status: DC
  Filled 2020-04-28: qty 18.2

## 2020-04-28 MED ORDER — SODIUM CHLORIDE 0.9 % IV SOLN
2.0000 g | Freq: Two times a day (BID) | INTRAVENOUS | Status: DC
  Administered 2020-04-28 – 2020-04-29 (×3): 2 g via INTRAVENOUS
  Filled 2020-04-28 (×2): qty 2
  Filled 2020-04-28: qty 20
  Filled 2020-04-28: qty 2

## 2020-04-28 MED ORDER — LORAZEPAM 2 MG/ML IJ SOLN
2.0000 mg | Freq: Once | INTRAMUSCULAR | Status: AC
  Administered 2020-04-28: 2 mg via INTRAVENOUS

## 2020-04-28 MED ORDER — VALPROATE SODIUM 500 MG/5ML IV SOLN
1250.0000 mg | Freq: Once | INTRAVENOUS | Status: DC
  Filled 2020-04-28: qty 12.5

## 2020-04-28 MED ORDER — POLYETHYLENE GLYCOL 3350 17 G PO PACK: 17.0000 g | PACK | Freq: Every day | ORAL | Status: DC | PRN

## 2020-04-28 MED ORDER — DEXTROSE 5 % IV SOLN
10.0000 mg/kg | Freq: Three times a day (TID) | INTRAVENOUS | Status: DC
  Administered 2020-04-28 – 2020-04-29 (×4): 900 mg via INTRAVENOUS
  Filled 2020-04-28 (×6): qty 18

## 2020-04-28 MED ORDER — LEVETIRACETAM IN NACL 1000 MG/100ML IV SOLN: 1000.0000 mg | Freq: Two times a day (BID) | INTRAVENOUS | Status: DC

## 2020-04-28 MED ORDER — SODIUM CHLORIDE 0.9 % IV SOLN
200.0000 mg | Freq: Three times a day (TID) | INTRAVENOUS | Status: DC
  Filled 2020-04-28 (×2): qty 4

## 2020-04-28 MED ORDER — LORAZEPAM 2 MG/ML IJ SOLN: 2.0000 mg | Freq: Once | INTRAMUSCULAR | Status: AC

## 2020-04-28 MED ORDER — ONDANSETRON HCL 4 MG/2ML IJ SOLN: 4.0000 mg | Freq: Four times a day (QID) | INTRAMUSCULAR | Status: DC | PRN

## 2020-04-28 MED ORDER — LEVETIRACETAM IN NACL 1000 MG/100ML IV SOLN
1000.0000 mg | Freq: Two times a day (BID) | INTRAVENOUS | Status: DC
  Administered 2020-04-28: 1000 mg via INTRAVENOUS
  Filled 2020-04-28: qty 100

## 2020-04-28 MED ORDER — SODIUM CHLORIDE 0.9 % IV SOLN
2.0000 g | INTRAVENOUS | Status: DC
  Filled 2020-04-28 (×5): qty 2000

## 2020-04-28 MED ORDER — VANCOMYCIN HCL 1250 MG/250ML IV SOLN
1250.0000 mg | Freq: Two times a day (BID) | INTRAVENOUS | Status: DC
  Administered 2020-04-28 – 2020-04-29 (×2): 1250 mg via INTRAVENOUS
  Filled 2020-04-28 (×3): qty 250

## 2020-04-28 MED ORDER — CHLORHEXIDINE GLUCONATE 0.12 % MT SOLN
15.0000 mL | Freq: Two times a day (BID) | OROMUCOSAL | Status: DC
  Administered 2020-04-28 – 2020-04-29 (×2): 15 mL via OROMUCOSAL
  Filled 2020-04-28: qty 15

## 2020-04-28 MED ORDER — VANCOMYCIN HCL 1250 MG/250ML IV SOLN
1250.0000 mg | Freq: Two times a day (BID) | INTRAVENOUS | Status: DC
  Filled 2020-04-28: qty 250

## 2020-04-28 MED ORDER — DEXTROSE 5 % IV SOLN
1250.0000 mg | Freq: Three times a day (TID) | INTRAVENOUS | Status: DC
  Filled 2020-04-28 (×2): qty 25

## 2020-04-28 MED ORDER — CLOBAZAM 10 MG PO TABS
10.0000 mg | ORAL_TABLET | Freq: Once | ORAL | Status: DC
  Filled 2020-04-28: qty 1

## 2020-04-28 MED ORDER — SODIUM CHLORIDE 0.9 % IV SOLN
200.0000 mg | Freq: Two times a day (BID) | INTRAVENOUS | Status: DC
  Administered 2020-04-28 – 2020-04-29 (×3): 200 mg via INTRAVENOUS
  Filled 2020-04-28 (×4): qty 20

## 2020-04-28 MED ORDER — VANCOMYCIN HCL 10 G IV SOLR
2500.0000 mg | Freq: Once | INTRAVENOUS | Status: DC
  Filled 2020-04-28: qty 2500

## 2020-04-28 MED ORDER — CEFTRIAXONE SODIUM 1 G IJ SOLR
2.0000 g | Freq: Two times a day (BID) | INTRAMUSCULAR | Status: DC
  Filled 2020-04-28 (×2): qty 20

## 2020-04-28 MED ORDER — SODIUM CHLORIDE 0.9 % IV SOLN
300.0000 mg | Freq: Three times a day (TID) | INTRAVENOUS | Status: DC
  Administered 2020-04-28: 300 mg via INTRAVENOUS
  Filled 2020-04-28 (×3): qty 6

## 2020-04-28 MED ORDER — DEXTROSE 5 % IV SOLN
1250.0000 mg | Freq: Once | INTRAVENOUS | Status: DC
  Filled 2020-04-28: qty 25

## 2020-04-28 MED ORDER — DEXTROSE 5 % IV SOLN
1250.0000 mg | Freq: Three times a day (TID) | INTRAVENOUS | Status: DC
  Filled 2020-04-28 (×3): qty 25

## 2020-04-28 MED ORDER — HEPARIN SODIUM (PORCINE) 5000 UNIT/ML IJ SOLN
5000.0000 [IU] | Freq: Three times a day (TID) | INTRAMUSCULAR | Status: DC
  Administered 2020-04-28 – 2020-05-02 (×12): 5000 [IU] via SUBCUTANEOUS
  Filled 2020-04-28 (×12): qty 1

## 2020-04-28 MED ORDER — HYDRALAZINE HCL 20 MG/ML IJ SOLN
10.0000 mg | INTRAMUSCULAR | Status: DC | PRN
  Administered 2020-04-28 – 2020-04-30 (×3): 10 mg via INTRAVENOUS
  Filled 2020-04-28 (×4): qty 1

## 2020-04-28 MED ORDER — SODIUM CHLORIDE 0.9 % IV SOLN
200.0000 mg | Freq: Two times a day (BID) | INTRAVENOUS | Status: DC
  Filled 2020-04-28: qty 20

## 2020-04-28 MED ORDER — DOCUSATE SODIUM 100 MG PO CAPS
100.0000 mg | ORAL_CAPSULE | Freq: Two times a day (BID) | ORAL | Status: DC | PRN
  Administered 2020-05-01: 100 mg via ORAL
  Filled 2020-04-28: qty 1

## 2020-04-28 MED ORDER — POTASSIUM CHLORIDE 10 MEQ/100ML IV SOLN
10.0000 meq | INTRAVENOUS | Status: AC
  Administered 2020-04-28 (×4): 10 meq via INTRAVENOUS
  Filled 2020-04-28 (×4): qty 100

## 2020-04-28 MED ORDER — CHLORHEXIDINE GLUCONATE CLOTH 2 % EX PADS
6.0000 | MEDICATED_PAD | Freq: Every day | CUTANEOUS | Status: DC
  Administered 2020-04-30 – 2020-05-02 (×2): 6 via TOPICAL

## 2020-04-28 MED ORDER — LACOSAMIDE 200 MG/20ML IV SOLN
200.0000 mg | Freq: Two times a day (BID) | INTRAVENOUS | Status: DC
Start: 2020-04-28 — End: 2020-05-02

## 2020-04-28 MED ORDER — SODIUM CHLORIDE 0.9 % IV SOLN: 2.0000 g | Freq: Two times a day (BID) | INTRAVENOUS | Status: DC

## 2020-04-28 MED ORDER — PHENYTOIN SODIUM 50 MG/ML IJ SOLN
100.0000 mg | Freq: Three times a day (TID) | INTRAMUSCULAR | Status: DC
  Administered 2020-04-28 – 2020-04-29 (×4): 100 mg via INTRAVENOUS
  Filled 2020-04-28 (×4): qty 2

## 2020-04-28 MED ORDER — VANCOMYCIN HCL 10 G IV SOLR
2500.0000 mg | Freq: Once | INTRAVENOUS | Status: AC
  Administered 2020-04-28: 2500 mg via INTRAVENOUS
  Filled 2020-04-28: qty 2500

## 2020-04-28 MED ORDER — SODIUM CHLORIDE 0.9 % IV SOLN
2.0000 g | INTRAVENOUS | Status: DC
  Administered 2020-04-28 – 2020-04-29 (×7): 2 g via INTRAVENOUS
  Filled 2020-04-28: qty 2
  Filled 2020-04-28 (×2): qty 2000
  Filled 2020-04-28: qty 2
  Filled 2020-04-28 (×2): qty 2000
  Filled 2020-04-28 (×2): qty 2
  Filled 2020-04-28 (×2): qty 2000
  Filled 2020-04-28 (×2): qty 2
  Filled 2020-04-28: qty 2000

## 2020-04-28 MED ORDER — LORAZEPAM 2 MG/ML IJ SOLN
2.0000 mg | Freq: Two times a day (BID) | INTRAMUSCULAR | Status: DC | PRN
  Administered 2020-04-28 – 2020-04-29 (×3): 2 mg via INTRAVENOUS
  Filled 2020-04-28 (×3): qty 1

## 2020-04-28 MED ORDER — SODIUM CHLORIDE 0.9 % IV SOLN
200.0000 mg | Freq: Two times a day (BID) | INTRAVENOUS | Status: DC
  Administered 2020-04-28: 200 mg via INTRAVENOUS
  Filled 2020-04-28 (×2): qty 20

## 2020-04-28 NOTE — ED Notes (Signed)
NP messaged regarding seizure, updated orders received, pharmacy called for med verification/send

## 2020-04-28 NOTE — ED Notes (Signed)
Pt responsive to voice

## 2020-04-28 NOTE — Progress Notes (Addendum)
Patient has been seizures for 4 min. Advised to give Ativan 2mg . Keppra 2000mg  x1 for now. Stat EEG. Then LTM He is drowsy, not appropriately following commands but protecting airway at this time. Will follow.  -- Amie [place], MD

## 2020-04-28 NOTE — ED Notes (Signed)
Assumed care of patient. Patient postictal, snoringasleep, respond to sternal stimuli. blc cx's x 2 drawn and sent. wife at bedside. Awaiting bed status. sz precautions maintained. Safety maintained. Will continue to monitor.

## 2020-04-28 NOTE — Progress Notes (Signed)
Pharmacy Antibiotic Note  Jake Kirby is a 54 y.o. male admitted on 04/28/2020 with meningitis.  Pharmacy has been consulted for Vanc, Rocephin, Ampicillin and Acyclovir dosing. CrCL > 100 ml/min  Vanc 2500 mg given at outside hospital at 0800  Plan: Vancomycin 1250 mg IV q12hr starting at 2000 Rocephin 2 gms IV q12hr Acyclovir 900 mg IV q8hr (based on adjusted body weight) Ampicillin 2 gm IV q4hr Monitor renal function, clinical status and culture results  Height: 5\' 8"  (172.7 cm) Weight: 123 kg (271 lb 2.7 oz) IBW/kg (Calculated) : 68.4  Temp (24hrs), Avg:98.2 F (36.8 C), Min:98 F (36.7 C), Max:98.3 F (36.8 C)  Recent Labs  Lab 04/27/20 1428 04/27/20 1818 04/28/20 0656  WBC 5.8 4.9 5.5  CREATININE 1.19 1.02 0.89    Estimated Creatinine Clearance: 122.5 mL/min (by C-G formula based on SCr of 0.89 mg/dL).    No Known Allergies  Antimicrobials this admission: Vanc 10/8 >> Rocephin 10/8 >> Acyclovir 10/8 >> Ampicillin 10/8 >>   Thank you for allowing pharmacy to be a part of this patient's care.  Alanda Slim, PharmD, [facility] Clinical Pharmacist Please see AMION for all Pharmacists' Contact Phone Numbers 04/28/2020, 11:30 AM

## 2020-04-28 NOTE — H&P (Signed)
NAME:  Jake Kirby, MRN:  [id number], DOB:  1965/09/24, LOS: 1 ADMISSION DATE:  04/27/2020, CONSULTATION DATE:  04/28/2020 REFERRING MD:  Dr. Roosevelt Locks, CHIEF COMPLAINT:  New onset seizures    Brief History   54yo male presented with new onset seizure  History of present illness   [name] is a 54yo male with a PMH significant for HTN, obesity, OSA not on CPAP at baseline who presented with new onset seizure activity. Family reported that patient was no quite himself afternoon of 10/7 family then noticed his arm was shaking and patient then fell to ground. EMS was called and on arrival a 1-2 min seizure was seen for which he received versed with resolution of initial seizure. Patient denied any drug or alcohol abuse.   On arrival to ED patents initial workup was unremarkable with stable vital signs. He underwent MRI brain that revealed small remote left parietal infarct with underlying mild chronic microvascular ischemic disease.Incidental pericallosal lipoma. Neurology was consulted and recommended loading with keppra and routine EEG.  Patient continued to have short seizure activity in the emergency department. When seizure activity persisted recommendations were made to begin broad spectrum antibiotics and acyclovir and transfer to [facility] for further neurology management and continuous EEG. Prior to transfer patient underwent LP.   Past Medical History  HTN, obesity, OSA not on CPAP at baseline   Significant Hospital Events   Admitted 10/7 for new onset seizures   Consults:  Neurology   Procedures:    Significant Diagnostic Tests:  CXR 10/7 > Mild cardiomegally  Head CT 10/7 > No acute intracranial hemorrhage, mass effect, or evidence of acute infarction.Small area of left parietal encephalomalacia.  MRI brain 10/7 > No acute intracranial abnormality. Small remote left parietal infarct with underlying mild chronic microvascular ischemic disease.Incidental pericallosal lipoma.  Micro  Data:  COVID 10/7 > Negative  Blood culture 10/8 > CSF 10/8 >  Antimicrobials:  Acyclovir 10/8 > Ceftriaxone 10/8 > Vancomycin 10/8 >   Interim history/subjective:  Lying in bed sleeping No further seizures since 7am   Objective   Blood pressure (!) 171/95, pulse 64, temperature 98.3 F (36.8 C), temperature source Axillary, resp. rate (!) 22, height 5\' 8"  (1.727 m), weight 124.7 kg, SpO2 99 %.        Intake/Output Summary (Last 24 hours) at 04/28/2020 0949 Last data filed at 04/28/2020 0702 Gross per 24 hour  Intake 237 ml  Output --  Net 237 ml   Filed Weights   04/28/20 0649  Weight: 124.7 kg   Examination: General: Pleasant middle aged male lying in bed in no acute distress  HEENT: [place]/AT, MM pink/moist, PERRL,  Neuro: sleepy with snoring respirations but will arouse to loud verbal stimuli, alert and oriented x3 CV: s1s2 regular rate and rhythm, no murmur, rubs, or gallops,  PULM:  Snoring respirations, no increased work of breathing, no added breath sounds  GI: soft, bowel sounds active in all 4 quadrants, non-tender, non-distended Extremities: warm/dry, no edema  Skin: no rashes or lesions  Resolved Hospital Problem list     Assessment & Plan:  New onset seizure with concern for status epilepticus  -No known history of seizure, drug use, or alcohol use Intracranial Lipoma -Seen on MRI brain  P: Primary Management per neurology  Maintain neuro protective measures; goal for eurothermia, euglycemia, eunatermia, normoxia, and PCO2 goal of 35-40  Nutrition and bowel regiment  Seizure precautions  AEDs per neurology  Meningitis coverage with ampicillin,  ceftriaxone, acyclovir LP obtain at [facility] ED   Hx of HTN poorly controlled  -Recently started on oral antihypertensives 1 month prior to admission due to SBP 160-190 with associated headache and blurry vision  -Home medications include lisinopril and bystolic  P: PRN IV hydralazine  Monitor closely in the ICU  setting   Hx of OSA not on CPAP at baseline  P: Monitor respiratory effort and drive closely, given multiple AED in the setting of OSA hypercapnia may develop  Encourage pulmonary hygiene  Tobacco abuse  P: Consulting will be addressed once appropriate    Best practice:  Diet: NPO Pain/Anxiety/Delirium protocol (if indicated): PRNs VAP protocol (if indicated): N/A DVT prophylaxis: Subq heparin  GI prophylaxis: PPI Glucose control: SSI Mobility: Bedrest Code Status: Full Family Communication: Will update on arrival Disposition: ICU  Labs   CBC: Recent Labs  Lab 04/27/20 1428 04/27/20 1818 04/28/20 0656  WBC 5.8 4.9 5.5  NEUTROABS  --   --  3.7  HGB 13.3 12.9* 13.3  HCT 40.9 40.1 40.9  MCV 86.1 86.8 86.7  PLT 188 226 824    Basic Metabolic Panel: Recent Labs  Lab 04/27/20 1428 04/27/20 1818 04/28/20 0656  NA 139  --  141  K 3.9  --  3.4*  CL 102  --  104  CO2 25  --  30  GLUCOSE 114*  --  109*  BUN 20  --  14  CREATININE 1.19 1.02 0.89  CALCIUM 8.6*  --  8.5*  MG  --  2.1  --    GFR: Estimated Creatinine Clearance: 123.4 mL/min (by C-G formula based on SCr of 0.89 mg/dL). Recent Labs  Lab 04/27/20 1428 04/27/20 1818 04/28/20 0656  WBC 5.8 4.9 5.5    Liver Function Tests: Recent Labs  Lab 04/27/20 1428 04/28/20 0656  AST 23 16  ALT 16 15  ALKPHOS 59 50  BILITOT 0.9 0.6  PROT 6.7 6.9  ALBUMIN 3.7 3.7   No results for input(s): LIPASE, AMYLASE in the last 168 hours. No results for input(s): AMMONIA in the last 168 hours.  ABG No results found for: PHART, PCO2ART, PO2ART, HCO3, TCO2, ACIDBASEDEF, O2SAT   Coagulation Profile: Recent Labs  Lab 04/27/20 1518  INR 1.0    Cardiac Enzymes: No results for input(s): CKTOTAL, CKMB, CKMBINDEX, TROPONINI in the last 168 hours.  HbA1C: No results found for: HGBA1C  CBG: Recent Labs  Lab 04/27/20 2035  GLUCAP 110*    Review of Systems:   Unable to assess given somnolence   Past  Medical History  He,  has a past medical history of Hypertension.   Surgical History    Past Surgical History:  Procedure Laterality Date  . BACK SURGERY    . COLONOSCOPY WITH PROPOFOL N/A 02/12/2019   Procedure: COLONOSCOPY WITH PROPOFOL;  Surgeon: Virgel Manifold, MD;  Location: [facility] ENDOSCOPY;  Service: Endoscopy;  Laterality: N/A;     Social History   reports that he has been smoking cigarettes. He has never used smokeless tobacco. He reports current alcohol use of about 5.0 standard drinks of alcohol per week. He reports that he does not use drugs.   Family History   His family history is not on file.   Allergies No Known Allergies   Home Medications  Prior to Admission medications   Medication Sig Start Date End Date Taking? Authorizing Provider  lisinopril (PRINIVIL,ZESTRIL) 20 MG tablet Take 20 mg by mouth daily. 07/25/18  Yes [provider]  nebivolol (BYSTOLIC) 5 MG tablet Take 5 mg by mouth daily.   Yes [provider]  lacosamide 200 mg in sodium chloride 0.9 % 25 mL Inject 200 mg into the vein every 12 (twelve) hours. 04/28/20   Sharen Hones, MD  levETIRAcetam (KEPPRA) 1000 MG/100ML SOLN Inject 100 mLs (1,000 mg total) into the vein every 12 (twelve) hours. 04/28/20   Sharen Hones, MD  phenytoin 300 mg in sodium chloride 0.9 % 100 mL Inject 300 mg into the vein every 8 (eight) hours. 04/28/20   Sharen Hones, MD     Critical care time:   CRITICAL CARE Performed by: Johnsie Cancel  Total critical care time: 50 minutes  Critical care time was exclusive of separately billable procedures and treating other patients.  Critical care was necessary to treat or prevent imminent or life-threatening deterioration.  Critical care was time spent personally by me on the following activities: development of treatment plan with patient and/or surrogate as well as nursing, discussions with consultants, evaluation of patient's response to treatment, examination  of patient, obtaining history from patient or surrogate, ordering and performing treatments and interventions, ordering and review of laboratory studies, ordering and review of radiographic studies, pulse oximetry and re-evaluation of patient's condition.  Johnsie Cancel, NP-[facility] Contact / Pager information can be found on Amion  04/28/2020, 11:04 AM

## 2020-04-28 NOTE — ED Notes (Addendum)
Pt began mild seizure at 0210. Pt presents with mild tremors to head/neck, back, arms, and rigidity of legs. 2mg  ativan administered 0212, by 0982 pt episode calmed down to minor tremors in head/neck. 0215 pt no longer seizing, pt resumes snoring  VS stable throughout, o2 maintained, airway patent, HR NSR

## 2020-04-28 NOTE — Consult Note (Addendum)
NEURO HOSPITALIST CONSULT NOTE   Requestig physician: Dr. Pearline Cables  Reason for Consult: Status epilepticus, now resolved  History obtained from:  Patient and chart   HPI:                                                                                                                                          Jake Kirby is an 54 y.o. male presenting to the [facility] in transfer from [facility] for status epilepticus, now resolved.   Dr. Curly Shores evaluated the patient this morning at [facility]. Per her note: "Jake Kirby is a 53 y.o. male with a past medical history significant for hypertension (recently started blood pressure medications 1 month ago), obstructive sleep apnea noncompliant with CPAP, and lower back pain status post lumbar fusion (0932), complicated by chronic back pain. Wife reports that patient has had worsening chronic back pain since January 2021, complaining about his legs giving out intermittently since then.  He has been having elevated blood pressures in the 160s to 190s associated with headache and blurry vision when extremely elevated.  For this he started taking antihypertensive medications 1 month ago and the second medication was added recently.  He has been noted to be increasingly tired in the past 2 days. On 10/7 at about 1:30 PM he was noted to be standing up, swaying, nonresponsive to voice.  He was helped to a chair and sat down and then was noted to have some shaking of his right arm lasting 15 to 20 seconds and resolving, but then recurring repeatedly without resolution.  Generalized tonic-clonic seizures were observed by EMS. Initial work-up was unremarkable, patient was afebrile and vital signs were stable, and seizures had resolved with a load of IV Keppra.  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  Routine neurology consult  was requested for 10/8. Overnight the patient experienced further seizure events, for which she was loaded with phenytoin.  Again he had initial improvement in his seizure frequency, but then had further recurrent seizures.  Meningitis coverage was added and an emergent LP was performed with fluoroscopy guidance given his body habitus and prior lumbar surgery. He was prepared for transfer to [facility] for long-term monitoring, ICU status for high likelihood of needing intubation for seizure control and close monitoring. ROS: A 14 point ROS was performed with wife.  She specifically denied any fevers, chills, sweats, chest pain, chest palpitations, shortness of breath, nausea, vomiting, diarrhea, urinary issues, episodes of urinary or bowel incontinence, episodes of tongue biting prior to 10/7, any prior focal neurological deficits such as weakness or numbness."  An LP was performed at [facility]  prior to transfer. WBC 5, glucose 70, total protein mildly elevated at 58 mg/dL but tap was bloody with 358 RBC per microliter.   On arrival to the ICU at [facility], the patient is oriented but tired appearing, falling asleep while we talk. He denies headaches or history of prior seizures. He states he just suddenly became dizzy when he stood up from his bed at home. He is not sure if he feels dizzy currently. He endorses sensitivity to light. He denies recent changes in medications. He often has restless leg on the left.  He is currently on the following 3 anticonvulsants: Keppra, Dilantin and Vimpat.   Labs at [facility]: Normal platelets, normal coagulation studies,  COVID negative  CXR negative  Utox positive for benzos given by EMS  Cr 1.19 CBC without leukocytosis, normal CMP normal, Mag 2.1  UA negative for infection Blood cultures pending HIV negative  Post load phenytoin level 9.5; albumin 3.7, CrCl 23.4, GFR >60  CT head at [facility]:  No acute intracranial hemorrhage, mass effect, or evidence of  acute Infarction. Small area of left parietal encephalomalacia as per official Radiology report, but on review of images by Neurologist, it appears also compatible with a small region of focal cortical dysplasia.   MRI brain at [facility]: . 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    Past Medical History:  Diagnosis Date  . Hypertension     Past Surgical History:  Procedure Laterality Date  . BACK SURGERY    . COLONOSCOPY WITH PROPOFOL N/A 02/12/2019   Procedure: COLONOSCOPY WITH PROPOFOL;  Surgeon: Virgel Manifold, MD;  Location: [facility] ENDOSCOPY;  Service: Endoscopy;  Laterality: N/A;    No family history on file.            Social History:  reports that he has been smoking cigarettes. He has never used smokeless tobacco. He reports current alcohol use of about 5.0 standard drinks of alcohol per week. He reports that he does not use drugs.  No Known Allergies  MEDICATIONS:                                                                                                                     No current facility-administered medications on file prior to encounter.   Current Outpatient Medications on File Prior to Encounter  Medication Sig Dispense Refill  . lacosamide 200 mg in sodium chloride 0.9 % 25 mL Inject 200 mg into the vein every 12 (twelve) hours.    . levETIRAcetam (KEPPRA) 1000 MG/100ML SOLN Inject 100 mLs (1,000 mg total) into the vein every 12 (twelve) hours. 2000 mL   . lisinopril (PRINIVIL,ZESTRIL) 20 MG tablet Take 20 mg by mouth daily.    Marland Kitchen  nebivolol (BYSTOLIC) 5 MG tablet Take 5 mg by mouth daily.    . phenytoin 300 mg in sodium chloride  0.9 % 100 mL Inject 300 mg into the vein every 8 (eight) hours.     Scheduled: . heparin  5,000 Units Subcutaneous Q8H  . phenytoin (DILANTIN) IV  100 mg Intravenous Q8H   Continuous: . lacosamide (VIMPAT) IV    . levETIRAcetam       ROS:                                                                                                                                       History obtained from patient and chart review  General ROS: negative for - chills, fatigue, fever, night sweats, weight gain or weight loss Psychological ROS: negative for - behavioral disorder, hallucinations, mood swings or suicidal ideation Ophthalmic ROS: negative for - positive for photophobia ENT ROS: negative for - epistaxis, nasal discharge, oral lesions, sore throat, tinnitus or vertigo Allergy and Immunology ROS: negative for - hives or itchy/watery eyes Hematological and Lymphatic ROS: negative for - bleeding problems, bruising or swollen lymph nodes Respiratory ROS: negative for - cough, hemoptysis, shortness of breath or wheezing Cardiovascular ROS: negative for - chest pain, dyspnea on exertion, edema or irregular heartbeat Gastrointestinal ROS: negative for - +suprapubic tenderness, no diarrhea, hematemesis, nausea/vomiting or stool incontinence Genito-Urinary ROS: negative for - +urinary urgency, dysuria, hematuria, incontinence Musculoskeletal ROS: negative for - joint swelling or muscular weakness, chronic left restless leg Neurological ROS: as noted in HPI Dermatological ROS: negative for rash and skin lesion changes   Height 5\' 8"  (1.727 m), weight 123 kg.   General Examination:                                                                                                       Physical Exam  HEENT-  Normocephalic, no lesions, without obvious abnormality.  Normal external eye and conjunctiva. No neck tenderness with ROM Cardiovascular - regular rate and rhythm, no LE edema  Lungs-no  excessive work of breathing.  Saturations within normal limits Extremities- Warm, dry and intact Musculoskeletal-no joint tenderness, deformity or swelling Skin-warm and dry, no hyperpigmentation, vitiligo, or suspicious lesions  Neurological Examination Mental Status: Somnolent but oriented x 4. Appears fatigued. Has dysthymic affect. Speech fluent but sparse.  Able to follow all simple commands without difficulty. Cranial Nerves: II: Visual fields grossly normal. PERRL.+photophobia  III,IV, VI: Ptosis not present. EOMI.  V,VII: Smile symmetric,  facial light touch sensation equal bilaterally VIII: hearing normal bilaterally IX,X: unable to visualize XI: bilateral shoulder shrug XII: midline tongue extension Motor: Right : Upper extremity   5/5    Left:     Upper extremity   5/5  Lower extremity   5/5     Lower extremity   5/5 Tone and bulk:normal tone throughout; no atrophy noted Sensory: Light touch intact throughout, bilaterally, temperature sensation intact bilateral lower and upper extremities Deep Tendon Reflexes: 3+ right patellar, otherwise 2+ throughout Plantars: Right: downgoing   Left: downgoing Cerebellar: normal finger-to-nose Gait: Deferred due to postictal state   Lab Results: Basic Metabolic Panel: Recent Labs  Lab 04/27/20 1428 04/27/20 1818 04/28/20 0656  NA 139  --  141  K 3.9  --  3.4*  CL 102  --  104  CO2 25  --  30  GLUCOSE 114*  --  109*  BUN 20  --  14  CREATININE 1.19 1.02 0.89  CALCIUM 8.6*  --  8.5*  MG  --  2.1  --     CBC: Recent Labs  Lab 04/27/20 1428 04/27/20 1818 04/28/20 0656  WBC 5.8 4.9 5.5  NEUTROABS  --   --  3.7  HGB 13.3 12.9* 13.3  HCT 40.9 40.1 40.9  MCV 86.1 86.8 86.7  PLT 188 226 222    Cardiac Enzymes: No results for input(s): CKTOTAL, CKMB, CKMBINDEX, TROPONINI in the last 168 hours.  Lipid Panel: No results for input(s): CHOL, TRIG, HDL, CHOLHDL, VLDL, LDLCALC in the last 168 hours.  Imaging: CT Head Wo  Contrast  Result Date: 04/27/2020 CLINICAL DATA:  Seizure EXAM: CT HEAD WITHOUT CONTRAST TECHNIQUE: Contiguous axial images were obtained from the base of the skull through the vertex without intravenous contrast. COMPARISON:  None. FINDINGS: Brain: There is no acute intracranial hemorrhage, mass effect, or edema. No acute appearing loss of gray-white differentiation. There is a small area of encephalomalacia involving the left parietal lobe. There is no extra-axial fluid collection. A lipoma is noted along the corpus callosum. Ventricles and sulci are within normal limits in size and configuration. Vascular: No hyperdense vessel. There is intracranial atherosclerotic calcification at the skull base. Skull: Calvarium is unremarkable. Sinuses/Orbits: No acute finding. Other: None. IMPRESSION: No acute intracranial hemorrhage, mass effect, or evidence of acute infarction. Small area of left parietal encephalomalacia. Electronically Signed   By: Macy Mis M.D.   On: 04/27/2020 15:08   MR BRAIN W WO CONTRAST  Result Date: 04/28/2020 CLINICAL DATA:  Initial evaluation for acute seizure. EXAM: MRI HEAD WITHOUT AND WITH CONTRAST TECHNIQUE: Multiplanar, multiecho pulse sequences of the brain and surrounding structures were obtained without and with intravenous contrast. CONTRAST:  65mL GADAVIST GADOBUTROL 1 MMOL/ML IV SOLN COMPARISON:  Comparison made with prior CT from earlier same day. FINDINGS: Brain: Examination moderately to severely degraded by motion artifact. Cerebral volume within normal limits for age. Mild scattered T2/FLAIR hyperintensity noted within the periventricular and deep white matter both cerebral hemispheres, nonspecific, but most like related chronic microvascular ischemic disease. Small focus of cortical encephalomalacia noted at the left parietal lobe, likely related to a remote ischemic infarct. No abnormal foci of restricted diffusion to suggest acute or subacute ischemia or changes  related to seizure. Gray-white matter differentiation otherwise maintained. No other areas of chronic cortical infarction. No definite evidence for acute or chronic intracranial hemorrhage on this motion degraded exam. No appreciable mass lesion, mass effect or midline shift. No hydrocephalus or  extra-axial fluid collection. Pituitary gland suprasellar region within normal limits. Midline structures intact. Pericallosal lipoma noted. No appreciable intrinsic temporal lobe abnormality. No appreciable abnormal enhancement on this motion degraded exam. Vascular: Major intracranial vascular flow voids are maintained. Skull and upper cervical spine: Craniocervical junction within normal limits. Bone marrow signal intensity normal. No scalp soft tissue abnormality. Sinuses/Orbits: Globes and orbital soft tissues demonstrate no acute finding. Paranasal sinuses are largely clear. No significant mastoid effusion. Inner ear structures grossly normal. Other: None. IMPRESSION: 1. Motion degraded exam. 2. No acute intracranial abnormality. 3. Small remote left parietal infarct with underlying mild chronic microvascular ischemic disease. 4. Incidental pericallosal lipoma. Electronically Signed   By: Jeannine Boga M.D.   On: 04/28/2020 01:00   DG Chest Port 1 View  Result Date: 04/27/2020 CLINICAL DATA:  Seizure EXAM: PORTABLE CHEST 1 VIEW COMPARISON:  None. FINDINGS: Heart size is mildly enlarged. No focal airspace consolidation, pleural effusion, or pneumothorax. Osseous structures within normal limits. IMPRESSION: Mild cardiomegaly. No active pulmonary disease. Electronically Signed   By: Davina Poke D.O.   On: 04/27/2020 14:42   DG FLUORO GUIDED LOC OF NEEDLE/CATH TIP FOR SPINAL INJECT LT  Result Date: 04/28/2020 CLINICAL DATA:  Altered mental status. EXAM: DIAGNOSTIC LUMBAR PUNCTURE UNDER FLUOROSCOPIC GUIDANCE FLUOROSCOPY TIME:  Fluoroscopy Time:  0 minutes 7 seconds. Radiation Exposure Index (if provided  by the fluoroscopic device): 19.2 mGy PROCEDURE: After discussing the risks and benefits of this procedure with the patient and patient's wife informed consent was obtained. Back was sterilely prepped and draped. Following local anesthesia with 1% lidocaine a 22 gauge spinal needle was advanced into the L5-S1 space and 10 cc of CSF obtained and sent order labs. The CSF was slightly blood-tinged. IMPRESSION: Successful fluoroscopically directed lumbar puncture. 10 cc of CSF obtained and sent to ordered labs. CSF was slightly blood-tinged. Electronically Signed   By: Marcello Moores  Register   On: 04/28/2020 09:43      Assessment: 54 y.o. w/ remote left parietal insult, HTN, OSA presenting with status epilepticus requiring emergent transfer to [facility] for LTM, close monitoring and likely intubation. Concern for potential infectious or inflammatory etiology given refractory nature though workup thus far not suggestive of infection and notably no nuchal rigidity on exam; does not seem to be immunosuppressed. Per radiology read, MRI demonstrates small remote left parietal infarct. Per neurologist review, this could be a focal region of cortical dysplasia, which can increase risk of seizure. While these seizures usually present during childhood, they do less commonly present during adulthood as well. There is also a pericallosal lipoma, which increases the probability of associated cerebral malformation and seizure risk.  -- MRI brain at [facility]: Small remote left parietal infarct with underlying mild chronic microvascular ischemic disease per official Radiology report, but on review of images by Neurologist, it appears also compatible with a small region of focal cortical dysplasia. 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   -- The following meds were administered at [facility] for seizure control:   -  Ativan 2 mg at 14:30, 20:00 on 10/7 and 7:16 at  10/8  - Keppra 2000 mg load @ 1500 on 10/7, 1000 mg  BID   - Phenytoin 1000 mg load @ 2025 on 10/7, 300  mg 05:50; post load level 9.5 - goal 15 - 20   - Lacosamide 200 mg IV at 6:30 on 10/8 -  Meningitis coverage was started at [facility] with vancomycin 25 mg/kg load, then per pharmacy; ampicillin 2 g q4r, ceftriaxone 2 g q12hr, acyclovir per pharmacy dosing. - Currently postictal. Able to follow all commands and answer questions. Appears fatigued and mildly drowsy.  - LP shows WBC 5, glucose 70, total protein mildly elevated at 58 mg/dL but tap was bloody with 358 RBC per microliter. LP and symptoms do not appear to be consisent with infectious etiology. Unlikely to be a meningitis. Would consider discontinuation of empiric meningitis coverage.   Recommendations: 1. Continue Keppra, Dilantin and Vimpat at current dosages.  2. Obtain Dilantin level tomorrow AM (ordered).  3. LTM EEG has been ordered. 4. Recommend discontinuation of antibiotics and acyclovir  5.  Frequent neuro checks.  6. Neurohospitalist team has called Radiology for second opinion on what may be focal cortical dysplasia in the left parietal lobe, rather than an old stroke.  7. Inpatient seizure precautions.  8. When the patient is completely back to baseline, outpatient seizure precautions should be discussed with him: Per [facility] statutes, patients with seizures are not allowed to drive until  they have been seizure-free for six months. Use caution when using heavy equipment or power tools. Avoid working on ladders or at heights. Take showers instead of baths. Ensure the water temperature is not too high on the home water heater. Do not go swimming alone. When caring for infants or small children, sit down when holding, feeding, or changing them to minimize risk of injury to the child in the event you have a seizure. Also, Maintain good sleep hygiene. Avoid alcohol.  50  minutes spent in the neurological evaluation and management of this critically ill patient.  Electronically signed: Dr. Kerney Elbe 04/28/2020, 10:58 AM

## 2020-04-28 NOTE — ED Notes (Signed)
Patient off unit to floro for lumbar puncture.

## 2020-04-28 NOTE — Consult Note (Addendum)
Neurology Consultation Reason for Consult: Status epilepticus  Referring Physician: Dr. Sharen Hones   CC: Seizure   History is obtained from: wife and chart review   HPI: Jake Kirby is a 54 y.o. male with a past medical history significant for hypertension (recently started blood pressure medications 1 month ago), obstructive sleep apnea noncompliant with CPAP, and lower back pain status post lumbar fusion (9357), complicated by chronic back pain.  Wife reports that patient has had worsening chronic back pain since January 2021, complaining about his legs giving out intermittently since then.  He has been having elevated blood pressures in the 160s to 190s associated with headache and blurry vision when extremely elevated.  For this he started taking antihypertensive medications 1 month ago and the second medication was added recently.  He has been noted to be increasingly tired in the past 2 days.  On 10/7 at about 1:30 PM he was noted to be standing up, swaying, nonresponsive to voice.  He was helped to a chair and sat down and then was noted to have some shaking of his right arm lasting 15 to 20 seconds and resolving, but then recurring repeatedly without resolution.  Generalized tonic-clonic seizures were observed by EMS.   Initial work-up was unremarkable, patient was afebrile and vital signs were stable, and seizures had resolved with a load of IV Keppra.  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  Routine neurology consult was requested for 10/8  Overnight the patient experienced further seizure events, for which she was loaded with phenytoin.  Again he had initial improvement in his seizure frequency, but then had further recurrent seizures.  Meningitis coverage was added and an emergent LP was performed with fluoroscopy guidance given his body habitus  and prior lumbar surgery.  He was prepared for transfer to [facility] for long-term monitoring, ICU status for high likelihood of needing intubation for seizure control and close monitoring.   ROS: A 14 point ROS was performed with wife.  She specifically denied any fevers, chills, sweats, chest pain, chest palpitations, shortness of breath, nausea, vomiting, diarrhea, urinary issues, episodes of urinary or bowel incontinence, episodes of tongue biting prior to 10/7, any prior focal neurological deficits such as weakness or numbness,    Past Medical History:  Diagnosis Date  . Hypertension   - OSA not on CPAP    No family history on file. - HTN & DM on both parents sides; dad passed from MI   Social History:  reports that he has been smoking cigarettes. He has never used smokeless tobacco. He reports current alcohol use of about 5.0 standard drinks of alcohol per week. He reports that he does not use drugs. confirmed with wife. He works 3rd shift at [employer]    Exam: Current vital signs: BP (!) 171/95 (BP Location: Right Arm)   Pulse 64   Temp 98.3 F (36.8 C) (Axillary)   Resp (!) 22   Ht 5\' 8"  (1.727 m)   Wt 124.7 kg   SpO2 99%   BMI 41.81 kg/m  Vital signs in last 24 hours: Temp:  [98 F (36.7 C)-98.3 F (36.8 C)] 98.3 F (36.8 C) (10/08 0734) Pulse Rate:  [49-70] 64 (10/08 0734) Resp:  [11-69] 22 (10/08 0734) BP: (113-199)/(62-115) 171/95 (10/08 0734) SpO2:  [90 %-100 %] 99 % (10/08 0738) Weight:  [  124.7 kg] 124.7 kg (10/08 0649)   Physical Exam  Constitutional: Appears well-developed and well-nourished.  Psych: Affect appropriate to situation Eyes: No scleral injection HENT: No OP obstruction, minimal jaw opening to command, MSK: no joint deformities.  Cardiovascular: Normal rate and regular rhythm.  Respiratory: Heavy snoring, intermittent apneas  GI: Soft.  No distension. There is no tenderness.  Skin: WDI  Neuro exam 7:30 - 9 AM Mental  Status: Patient is somnolent but arouses to voice. States he is in "hell," [facility] correct by choice, knows it's Oct 2021, able to name simple objects, repeat (though dysarthric), follows simple commands but falls asleep readily throughout examination. Patient is able to give limited history due to mental status  No signs of aphasia or neglect Cranial Nerves: II: Pupils are equal, round, and reactive to light.  III,IV, VI: EOMI without ptosis or diploplia.  V: Facial sensation is symmetric to temperature VII: Facial movement is symmetric.  VIII: hearing is intact to voice X:  cannot visualize palette XI: Shoulder shrug is symmetric. XII: tongue is midline without atrophy or fasciculations.  Motor: Tone is normal. No pronator drift. Both legs antigravity at least 5 seconds.  Sensory: Equally reactive to touch in all extremities  Deep Tendon Reflexes: No ankle clonus  Plantars: Toes are downgoing bilaterally.  Cerebellar/Gait: Unable to assess secondary to patient's mental status    I have reviewed labs in epic and the results pertinent to this consultation are: Normal platelets, normal coagulation studies,  COVID negative  CXR negative  Utox positive for benzos given by EMS  Cr 1.19 CBC without leukocytosis, normal CMP normal, Mag 2.1  UA negative for infection Blood cultures pending HIV negative  Post load phenytoin level 9.5; albumin 3.7, CrCl 23.4, GFR >60  I have reviewed the images obtained:  HCT with corpus callosum lipoma, posterior left parietal encephalomalacia, no bleeding  MRI motion degraded, no large stroke, no clear abnormal enhancement   Impression: 54 y.o. w/ remote left parietal insult, HTN, OSA presenting with status epilepticus requiring emergent transfer to [facility] for LTM, close monitoring and likely intubation. Concern for potential infectious or inflammatory etiology given refractory nature though workup thus far not suggestive of  infection and notably no nuchal rigidity on exam; does not seem to be immunosuppressed.    Recommendations:  # Status epilepticus - Transfer to [facility] for emergent EEG and LTM  AEDs: - Versed with EMS  - Ativan 2 mg at 14:30, 20:00 on 10/7 and 7:16 at 10/8 - Keppra 2000 mg load @ 1500 on 10/7, 1000 mg BID  - Phenytoin 1000 mg load @ 2025 on 10/7, 300 mg 05:50; post load level 9.5  - goal 15 - 20  - Lacosamide 200 mg IV at 6:30 on 10/8 - Meningitis coverage with vancomycin 25 mg/kg load, then per pharmacy; ampicillin 2 g q4r, ceftriaxone 2 g q12hr, acyclovir per pharmacy dosing  - Fluoro LP emergently for cell counts, bacterial culture, HSV/VZV (bloody but cleared)  # OSA - monitor carefully with additional sedation likely will need intubation  - Appreciate CCM assistance with respiratory status  # HTN - hold home medications in the setting of likely to need intubation / sedation   Lesleigh Noe MD-PhD [facility] [phone]  Critical care time of 55 minutes in managing this patient, collecting history, examining patient coordinating care.

## 2020-04-28 NOTE — Progress Notes (Addendum)
Pharmacy Antibiotic Note  Jake Kirby is a 54 y.o. male admitted on 04/27/2020 with herpes encephalitis.  Pharmacy has been consulted for Acyclovir dosing. TBW = 124.7 kg  CrCl = 107.7 ml/min     Plan: Acyclovir 1250 mg IV, followed by 910mg  Q8H X 14 days ordered to start on 10/8  (dose was adjusted to adjusted body weight after first dose was mixed)  Height: 5\' 8"  (172.7 cm) Weight: 124.7 kg (275 lb) IBW/kg (Calculated) : 68.4  Temp (24hrs), Avg:98 F (36.7 C), Min:98 F (36.7 C), Max:98 F (36.7 C)  Recent Labs  Lab 04/27/20 1428 04/27/20 1818  WBC 5.8 4.9  CREATININE 1.19 1.02    Estimated Creatinine Clearance: 107.7 mL/min (by C-G formula based on SCr of 1.02 mg/dL).    No Known Allergies  Antimicrobials this admission:   >>    >>   Dose adjustments this admission:   Microbiology results:  BCx:   UCx:    Sputum:    MRSA PCR:   Thank you for allowing pharmacy to be a part of this patient's care.  Robbins,Jason D 04/28/2020 7:10 AM   Lu Duffel, PharmD, BCPS Clinical Pharmacist 04/28/2020 8:22 AM

## 2020-04-28 NOTE — Progress Notes (Signed)
EEG with no seizures. Will monitor on LTM. If no seizures overnight, will d/c LTM in the AM.  -- Amie Portland, MD [facility] Pager: [phone] If 7pm to 7am, please call on call as listed on AMION.

## 2020-04-28 NOTE — Progress Notes (Signed)
LTM EEG hooked up and running - no initial skin breakdown - push button tested - neuro notified.  

## 2020-04-28 NOTE — Progress Notes (Signed)
STAT EEG complete - results pending. ? ?

## 2020-04-28 NOTE — Progress Notes (Signed)
Pt stable after lp.Back stable.

## 2020-04-28 NOTE — Progress Notes (Signed)
Cross cover note Patient with recurrent seizures despite start of keppra  And dilatin. Dilantin level am after dilantin load (dose that was 1000, not 1875mg  that would have been weight based) is 9.8 so daily dose dilantin ordered and addition dilantin dose of 100 mg given now.   Vimpat added.  Discussed with Dr. Curly Shores who requests patient be transferred to [facility] in [place] for continuous EEG monitoring and need for lumbar puncture as well as additional care and monitoring in neuro icu.  Care link notified of bed request Dr. Roosevelt Locks aware

## 2020-04-28 NOTE — ED Notes (Signed)
Pt had seizure lasting approx 10 seconds, right arm twitching, unresponsive to sternal rub, bilateral eyes twitching, pt returned to sleeping and snoring.

## 2020-04-28 NOTE — Discharge Summary (Signed)
Physician Discharge Summary  Patient ID: Jake Kirby MRN: [id number] DOB/AGE: 1965-10-04 54 y.o.  Admit date: 04/27/2020 Discharge date: 04/28/2020  Admission Diagnoses:  Discharge Diagnoses:  Active Problems:   Seizure ([place])  New onset of seizure with status epilepticus. Essential hypertension, Morbid obesity Obstructive sleep apnea not compliant with CPAP Tobacco abuse.   Discharged Condition: serious  Hospital Course:   Jake Kirby is a 54 y.o. male with medical history significant of obesity, hypertension, obstructive sleep apnea not on home CPAP, tobacco use who presents to the emergency department for evaluation of new onset seizure.   Patient was seen by neurology, started on multiple seizure medicines as well as covered with antibiotics with vancomycin, Rocephin and ampicillin and antiviral with acyclovir. MRI of the brain showed old infarct without acute changes.  Overnight, patient had multiple seizures requiring multiple Ativan injection. Last seizure was at 3 AM today.  Spoke with neurology, she has already arranged for patient be transferred to [facility] neuro ICU for continuous EEG monitoring and seizure treatment.      Consults: neurology  Significant Diagnostic Studies:  MRI HEAD WITHOUT AND WITH CONTRAST  TECHNIQUE: Multiplanar, multiecho pulse sequences of the brain and surrounding structures were obtained without and with intravenous contrast.  CONTRAST:  62mL GADAVIST GADOBUTROL 1 MMOL/ML IV SOLN  COMPARISON:  Comparison made with prior CT from earlier same day.  FINDINGS: Brain: Examination moderately to severely degraded by motion artifact.  Cerebral volume within normal limits for age. Mild scattered T2/FLAIR hyperintensity noted within the periventricular and deep white matter both cerebral hemispheres, nonspecific, but most like related chronic microvascular ischemic disease. Small focus of cortical encephalomalacia noted at  the left parietal lobe, likely related to a remote ischemic infarct.  No abnormal foci of restricted diffusion to suggest acute or subacute ischemia or changes related to seizure. Gray-white matter differentiation otherwise maintained. No other areas of chronic cortical infarction. No definite evidence for acute or chronic intracranial hemorrhage on this motion degraded exam.  No appreciable mass lesion, mass effect or midline shift. No hydrocephalus or extra-axial fluid collection. Pituitary gland suprasellar region within normal limits. Midline structures intact. Pericallosal lipoma noted. No appreciable intrinsic temporal lobe abnormality.  No appreciable abnormal enhancement on this motion degraded exam.  Vascular: Major intracranial vascular flow voids are maintained.  Skull and upper cervical spine: Craniocervical junction within normal limits. Bone marrow signal intensity normal. No scalp soft tissue abnormality.  Sinuses/Orbits: Globes and orbital soft tissues demonstrate no acute finding. Paranasal sinuses are largely clear. No significant mastoid effusion. Inner ear structures grossly normal.  Other: None.  IMPRESSION: 1. Motion degraded exam. 2. No acute intracranial abnormality. 3. Small remote left parietal infarct with underlying mild chronic microvascular ischemic disease. 4. Incidental pericallosal lipoma.   Electronically Signed   By: Jeannine Boga M.D.   On: 04/28/2020 01:00    Treatments: Vancomycin, Rocephin, ampicillin, acyclovir, Keppra, phenytoin, Lacosamide  Discharge Exam: Blood pressure (!) 171/95, pulse 64, temperature 98.3 F (36.8 C), temperature source Axillary, resp. rate (!) 22, height 5\' 8"  (1.727 m), weight 124.7 kg, SpO2 99 %. General appearance: alert, cooperative and Oriented to place and person. Resp: clear to auscultation bilaterally Cardio: regular rate and rhythm, S1, S2 normal, no murmur, click, rub or  gallop GI: soft, non-tender; bowel sounds normal; no masses,  no organomegaly Extremities: extremities normal, atraumatic, no cyanosis or edema Neurologic: Cranial nerves: normal Coordination: No ataxia    Disposition: Discharge disposition: 82-DC/txfr to short  term hosp for IP care with planned acute care IP readmission       Discharge Instructions    Diet - low sodium heart healthy   Complete by: As directed    Increase activity slowly   Complete by: As directed      Allergies as of 04/28/2020   No Known Allergies     Medication List    TAKE these medications   lacosamide 200 mg in sodium chloride 0.9 % 25 mL Inject 200 mg into the vein every 12 (twelve) hours.   levETIRAcetam 1000 MG/100ML Soln Commonly known as: KEPPRA Inject 100 mLs (1,000 mg total) into the vein every 12 (twelve) hours.   lisinopril 20 MG tablet Commonly known as: ZESTRIL Take 20 mg by mouth daily.   nebivolol 5 MG tablet Commonly known as: BYSTOLIC Take 5 mg by mouth daily.   phenytoin 300 mg in sodium chloride 0.9 % 100 mL Inject 300 mg into the vein every 8 (eight) hours.      45 minutes  Signed: Sharen Hones 04/28/2020, 8:05 AM

## 2020-04-28 NOTE — ED Notes (Signed)
Pt having another seizure, lasting approx 30 seconds.

## 2020-04-28 NOTE — ED Notes (Signed)
Pt continues to rest comfortably, HR NSR or sinus brady (55-60's). Pt denies further needs. Snoring at this time

## 2020-04-28 NOTE — Procedures (Signed)
Patient Name: Jake Kirby  MRN: [id number]  Epilepsy Attending: Lora Havens  Referring Physician/Provider: Dr Kerney Elbe Date: 04/28/2020 Duration: 24.07 mins  Patient history: 54 y.o. w/ remote left parietal insult, HTN, OSA presenting with status epilepticus requiring emergent transfer to [facility] for EEG to evaluate seizure.   Level of alertness:asleep/sedated  AEDs during EEG study: Ativan, PHT, LEV, LCM  Technical aspects: This EEG study was done with scalp electrodes positioned according to the 10-20 International system of electrode placement. Electrical activity was acquired at a sampling rate of 500Hz  and reviewed with a high frequency filter of 70Hz  and a low frequency filter of 1Hz . EEG data were recorded continuously and digitally stored.   Description:  Sleep was characterized by vertex waves, sleep spindles (12 to 14 Hz), maximal frontocentral region.  There is an excessive amount of 15 to 18 Hz  beta activity distributed symmetrically and diffusely.  Hyperventilation and photic stimulation were not performed.     ABNORMALITY -Excessive beta, generalized  IMPRESSION: This study is within normal limits. The excessive beta activity seen in the background is most likely due to the effect of benzodiazepine and is a benign EEG pattern. No seizures or epileptiform discharges were seen throughout the recording.   Jake Kirby

## 2020-04-28 NOTE — Consult Note (Signed)
Pharmacy Antibiotic Note  Jake Kirby is a 54 y.o. male admitted on 04/27/2020 with possible meningitis.  Pharmacy has been consulted for Vancomycin dosing.  Plan: Will give Vancomycin 2500mg  as loading dose, will follow with Vancomycin 1250 IV every 12 hours.  Goal trough 15-20 mcg/mL.   Height: 5\' 8"  (172.7 cm) Weight: 124.7 kg (275 lb) IBW/kg (Calculated) : 68.4  Temp (24hrs), Avg:98 F (36.7 C), Min:98 F (36.7 C), Max:98 F (36.7 C)  Recent Labs  Lab 04/27/20 1428 04/27/20 1818 04/28/20 0656  WBC 5.8 4.9 5.5  CREATININE 1.19 1.02 0.89    Estimated Creatinine Clearance: 123.4 mL/min (by C-G formula based on SCr of 0.89 mg/dL).    No Known Allergies  Antimicrobials this admission: Acyclovir 10/8 >> Ampicillin 10/8 >> Vancomycin 10/8 >> Ceftriaxone 10/8 >>  Dose adjustments this admission: N/A  Microbiology results:  BCx: pending  10/8 LP: pending  FLU/COVID NEG  Thank you for allowing pharmacy to be a part of this patient's care.  Lu Duffel, PharmD, BCPS Clinical Pharmacist 04/28/2020 7:33 AM

## 2020-04-29 DIAGNOSIS — F445 Conversion disorder with seizures or convulsions: Secondary | ICD-10-CM

## 2020-04-29 LAB — CBC
HCT: 44.3 % (ref 39.0–52.0)
Hemoglobin: 13.6 g/dL (ref 13.0–17.0)
MCH: 27 pg (ref 26.0–34.0)
MCHC: 30.7 g/dL (ref 30.0–36.0)
MCV: 87.9 fL (ref 80.0–100.0)
Platelets: 237 10*3/uL (ref 150–400)
RBC: 5.04 MIL/uL (ref 4.22–5.81)
RDW: 13.2 % (ref 11.5–15.5)
WBC: 5.9 10*3/uL (ref 4.0–10.5)
nRBC: 0 % (ref 0.0–0.2)

## 2020-04-29 LAB — BASIC METABOLIC PANEL
Anion gap: 9 (ref 5–15)
BUN: 10 mg/dL (ref 6–20)
CO2: 27 mmol/L (ref 22–32)
Calcium: 8.4 mg/dL — ABNORMAL LOW (ref 8.9–10.3)
Chloride: 105 mmol/L (ref 98–111)
Creatinine, Ser: 1.02 mg/dL (ref 0.61–1.24)
GFR, Estimated: >60 mL/min (ref >60)
Glucose, Bld: 102 mg/dL — ABNORMAL HIGH (ref 70–99)
Potassium: 3.7 mmol/L (ref 3.5–5.1)
Sodium: 141 mmol/L (ref 135–145)

## 2020-04-29 LAB — MAGNESIUM: Magnesium: 1.8 mg/dL (ref 1.7–2.4)

## 2020-04-29 LAB — GLUCOSE, CAPILLARY: Glucose-Capillary: 116 mg/dL — ABNORMAL HIGH (ref 70–99)

## 2020-04-29 LAB — PHENYTOIN LEVEL, TOTAL
Phenytoin Lvl: 12.4 ug/mL (ref 10.0–20.0)
Phenytoin Lvl: 13.5 ug/mL (ref 10.0–20.0)

## 2020-04-29 LAB — MRSA PCR SCREENING: MRSA by PCR: NEGATIVE

## 2020-04-29 LAB — VARICELLA-ZOSTER BY PCR: Varicella-Zoster, PCR: NEGATIVE

## 2020-04-29 LAB — PHOSPHORUS: Phosphorus: 3 mg/dL (ref 2.5–4.6)

## 2020-04-29 LAB — HEMOGLOBIN A1C
Hgb A1c MFr Bld: 5.8 % — ABNORMAL HIGH (ref 4.8–5.6)
Mean Plasma Glucose: 119.76 mg/dL

## 2020-04-29 MED ORDER — LEVETIRACETAM IN NACL 1500 MG/100ML IV SOLN: 1500.0000 mg | Freq: Two times a day (BID) | INTRAVENOUS | Status: DC

## 2020-04-29 MED ORDER — LEVETIRACETAM IN NACL 500 MG/100ML IV SOLN: 500.0000 mg | Freq: Two times a day (BID) | INTRAVENOUS | Status: DC

## 2020-04-29 MED ORDER — LEVETIRACETAM IN NACL 1000 MG/100ML IV SOLN: 1000.0000 mg | Freq: Two times a day (BID) | INTRAVENOUS | Status: DC

## 2020-04-29 MED ORDER — SODIUM CHLORIDE 0.9 % IV SOLN
2000.0000 mg | INTRAVENOUS | Status: AC
  Administered 2020-04-29: 2000 mg via INTRAVENOUS
  Filled 2020-04-29: qty 20

## 2020-04-29 MED ORDER — MAGNESIUM SULFATE 2 GM/50ML IV SOLN
2.0000 g | Freq: Once | INTRAVENOUS | Status: AC
  Administered 2020-04-29: 2 g via INTRAVENOUS
  Filled 2020-04-29: qty 50

## 2020-04-29 MED ORDER — LEVETIRACETAM IN NACL 500 MG/100ML IV SOLN: 500.0000 mg | Freq: Once | INTRAVENOUS | Status: DC

## 2020-04-29 MED ORDER — LEVETIRACETAM 500 MG PO TABS
500.0000 mg | ORAL_TABLET | Freq: Two times a day (BID) | ORAL | Status: DC
  Administered 2020-04-29 – 2020-05-02 (×6): 500 mg via ORAL
  Filled 2020-04-29 (×6): qty 1

## 2020-04-29 NOTE — Progress Notes (Signed)
RN called RE: pt possibly having seizures. Noted to have non epileptiic spells while on EEG. Advised to use ativan 1mg  IV if spells last more than 5-10 min. Neurology will follow and I will be available throughout the night should episodes recur.  -- Amie Portland, MD [facility]

## 2020-04-29 NOTE — Progress Notes (Addendum)
Subjective: Had recurrent seizure yesterday evening after arrival to [facility]. Spot and LTM EEG were performed after the seizure and subsequent load of Keppra, with no further seizures seen clinically or on EEG. He had a spell after 7 AM today that showed no electrographic correlate on EEG.     Objective: Current vital signs: BP 132/75   Pulse 70   Temp 98.6 F (37 C) (Oral)   Resp 17   Ht 5\' 8"  (1.727 m)   Wt 124.7 kg   SpO2 98%   BMI 41.80 kg/m  Vital signs in last 24 hours: Temp:  [98.4 F (36.9 C)-98.8 F (37.1 C)] 98.6 F (37 C) (10/09 0800) Pulse Rate:  [62-79] 70 (10/09 0900) Resp:  [11-36] 17 (10/09 0900) BP: (107-185)/(67-109) 132/75 (10/09 0900) SpO2:  [92 %-100 %] 98 % (10/09 0900) FiO2 (%):  [24 %] 24 % (10/08 1040) Weight:  [161 kg-124.7 kg] 124.7 kg (10/09 0500)  Intake/Output from previous day: 10/08 0701 - 10/09 0700 In: 2354.3 [IV Piggyback:2354.3] Out: 2200 [Urine:2200] Intake/Output this shift: Total I/O In: 100 [IV Piggyback:100] Out: -  Nutritional status:  Diet Order            Diet NPO time specified  Diet effective now                Physical Exam  GEN: morbidly obese, no acute distress. HEENT-  Normocephalic.  Normal external eye and conjunctiva. No neck tenderness with ROM, no nuchal regidity Cardiovascular - regular rate and rhythm, no LE edema  RESP: snoring loudly. Lungs-no excessive work of breathing. lung sounds clear Extremities- Warm, dry and intact Musculoskeletal-no joint tenderness, deformity or swelling Skin-warm and dry, no suspicious lesions  Neurological Examination -Somnolent, but awakes easily to voice. Orinets x4, speech is soft and sparse, but fluent. He follows commands w/psychomotor slowing. Attends, but has to be reminded to wake back up during exam.  -PERRL, EOMI, face symmetric, tongue midline, no s/s of tongue biting or swelling -5/5 throughout motor exam, no focal deficits -Intact to LT throughout -No gross  ataxia, slow to execute FTN -Did not attempt to ambulate.  Lab Results: Results for orders placed or performed during the hospital encounter of 04/28/20 (from the past 48 hour(s))  Glucose, capillary     Status: Abnormal   Collection Time: 04/28/20 11:11 AM  Result Value Ref Range   Glucose-Capillary 101 (H) 70 - 99 mg/dL    Comment: Glucose reference range applies only to samples taken after fasting for at least 8 hours.  CBC     Status: None   Collection Time: 04/28/20 11:26 AM  Result Value Ref Range   WBC 5.6 4.0 - 10.5 K/uL   RBC 5.18 4.22 - 5.81 MIL/uL   Hemoglobin 14.1 13.0 - 17.0 g/dL   HCT 45.4 39 - 52 %   MCV 87.6 80.0 - 100.0 fL   MCH 27.2 26.0 - 34.0 pg   MCHC 31.1 30.0 - 36.0 g/dL   RDW 12.9 11.5 - 15.5 %   Platelets 247 150 - 400 K/uL   nRBC 0.0 0.0 - 0.2 %    Comment: Performed at [facility], [place] [address], [place], [place] [address]  Creatinine, serum     Status: None   Collection Time: 04/28/20 11:26 AM  Result Value Ref Range   Creatinine, Ser 0.84 0.61 - 1.24 mg/dL   GFR, Estimated >60 >60 mL/min    Comment: Performed at [facility],  [address] [place], [place] [address]  MRSA PCR Screening     Status: None   Collection Time: 04/28/20  7:14 PM   Specimen: Nasal Mucosa; Nasopharyngeal  Result Value Ref Range   MRSA by PCR NEGATIVE NEGATIVE    Comment:        The GeneXpert MRSA Assay (FDA approved for NASAL specimens only), is one component of a comprehensive MRSA colonization surveillance program. It is not intended to diagnose MRSA infection nor to guide or monitor treatment for MRSA infections. Performed at [facility], [place] [address], [place] [address]   CBC     Status: None   Collection Time: 04/29/20  5:11 AM  Result Value Ref Range   WBC 5.9 4.0 - 10.5 K/uL   RBC 5.04 4.22 - 5.81 MIL/uL   Hemoglobin 13.6 13.0 - 17.0 g/dL   HCT 44.3 39 - 52 %   MCV 87.9 80.0 - 100.0 fL   MCH 27.0 26.0 - 34.0 pg   MCHC  30.7 30.0 - 36.0 g/dL   RDW 13.2 11.5 - 15.5 %   Platelets 237 150 - 400 K/uL   nRBC 0.0 0.0 - 0.2 %    Comment: Performed at [facility], [place] [address], [place], [place] [address]  Basic metabolic panel     Status: Abnormal   Collection Time: 04/29/20  5:11 AM  Result Value Ref Range   Sodium 141 135 - 145 mmol/L   Potassium 3.7 3.5 - 5.1 mmol/L   Chloride 105 98 - 111 mmol/L   CO2 27 22 - 32 mmol/L   Glucose, Bld 102 (H) 70 - 99 mg/dL    Comment: Glucose reference range applies only to samples taken after fasting for at least 8 hours.   BUN 10 6 - 20 mg/dL   Creatinine, Ser 1.02 0.61 - 1.24 mg/dL   Calcium 8.4 (L) 8.9 - 10.3 mg/dL   GFR, Estimated >60 >60 mL/min   Anion gap 9 5 - 15    Comment: Performed at [place] [address] [place], [place] [address]  Magnesium     Status: None   Collection Time: 04/29/20  5:11 AM  Result Value Ref Range   Magnesium 1.8 1.7 - 2.4 mg/dL    Comment: Performed at [place] [address], [place], [place] [address]  Phosphorus     Status: None   Collection Time: 04/29/20  5:11 AM  Result Value Ref Range   Phosphorus 3.0 2.5 - 4.6 mg/dL    Comment: Performed at [place] [address], [place], [place] [address]  Phenytoin level, total     Status: None   Collection Time: 04/29/20  5:11 AM  Result Value Ref Range   Phenytoin Lvl 12.4 10.0 - 20.0 ug/mL    Comment: Performed at [facility] [address], [place], [place] [address]    Recent Results (from the past 240 hour(s))  Respiratory Panel by RT PCR (Flu A&B, Covid) - Nasopharyngeal Swab     Status: None   Collection Time: 04/27/20  2:28 PM   Specimen: Nasopharyngeal Swab  Result Value Ref Range Status   SARS Coronavirus 2 by RT PCR NEGATIVE NEGATIVE Final    Comment: (NOTE) SARS-CoV-2 target nucleic acids are NOT DETECTED.  The SARS-CoV-2 RNA is generally detectable in upper respiratoy specimens during the acute phase of  infection. The lowest concentration of SARS-CoV-2  viral copies this assay can detect is 131 copies/mL. A negative result does not preclude SARS-Cov-2 infection and should not be used as the sole basis for treatment or other patient management decisions. A negative result may occur with  improper specimen collection/handling, submission of specimen other than nasopharyngeal swab, presence of viral mutation(s) within the areas targeted by this assay, and inadequate number of viral copies (<131 copies/mL). A negative result must be combined with clinical observations, patient history, and epidemiological information. The expected result is Negative.  Fact Sheet for Patients:  PinkCheek.be  Fact Sheet for Healthcare Providers:  GravelBags.it  This test is no t yet approved or cleared by the [place] FDA and  has been authorized for detection and/or diagnosis of SARS-CoV-2 by FDA under an Emergency Use Authorization (EUA). This EUA will remain  in effect (meaning this test can be used) for the duration of the COVID-19 declaration under Section 564(b)(1) of the Act, 21 U.S.C. section 360bbb-3(b)(1), unless the authorization is terminated or revoked sooner.     Influenza A by PCR NEGATIVE NEGATIVE Final   Influenza B by PCR NEGATIVE NEGATIVE Final    Comment: (NOTE) The Xpert Xpress SARS-CoV-2/FLU/RSV assay is intended as an aid in  the diagnosis of influenza from Nasopharyngeal swab specimens and  should not be used as a sole basis for treatment. Nasal washings and  aspirates are unacceptable for Xpert Xpress SARS-CoV-2/FLU/RSV  testing.  Fact Sheet for Patients: PinkCheek.be  Fact Sheet for Healthcare Providers: GravelBags.it  This test is not yet approved or cleared by the [place] FDA and  has been authorized for detection and/or diagnosis of SARS-CoV-2  by  FDA under an Emergency Use Authorization (EUA). This EUA will remain  in effect (meaning this test can be used) for the duration of the  Covid-19 declaration under Section 564(b)(1) of the Act, 21  U.S.C. section 360bbb-3(b)(1), unless the authorization is  terminated or revoked. Performed at [facility], [place] [place], [place] [address]   CSF culture     Status: None (Preliminary result)   Collection Time: 04/28/20  7:42 AM   Specimen: CSF; Cerebrospinal Fluid  Result Value Ref Range Status   Specimen Description   Final    CSF Performed at [facility], [address] [place], [place] [address]    Special Requests   Final    NONE Performed at [facility], [address] [place], [place] [address]    Gram Stain   Final    NO ORGANISMS SEEN WBC SEEN RED BLOOD CELLS PRESENT Performed at [facility], [address] [place], [place] [address]    Culture   Final    NO GROWTH < 24 HOURS Performed at [facility], [place] [address] [place], [place] [address]    Report Status PENDING  Incomplete  MRSA PCR Screening     Status: None   Collection Time: 04/28/20  7:14 PM   Specimen: Nasal Mucosa; Nasopharyngeal  Result Value Ref Range Status   MRSA by PCR NEGATIVE NEGATIVE Final    Comment:        The GeneXpert MRSA Assay (FDA approved for NASAL specimens only), is one component of a comprehensive MRSA colonization surveillance program. It is not intended to diagnose MRSA infection nor to guide or monitor treatment for MRSA infections. Performed at [facility], [place] [address] [place],  [address]     Lipid Panel No results for  input(s): CHOL, TRIG, HDL, CHOLHDL, VLDL, LDLCALC in the last 72 hours.  Studies/Results: CT Head Wo Contrast  Result Date: 04/27/2020 CLINICAL DATA:  Seizure EXAM: CT HEAD WITHOUT CONTRAST TECHNIQUE: Contiguous axial images were obtained from the base of the skull  through the vertex without intravenous contrast. COMPARISON:  None. FINDINGS: Brain: There is no acute intracranial hemorrhage, mass effect, or edema. No acute appearing loss of gray-white differentiation. There is a small area of encephalomalacia involving the left parietal lobe. There is no extra-axial fluid collection. A lipoma is noted along the corpus callosum. Ventricles and sulci are within normal limits in size and configuration. Vascular: No hyperdense vessel. There is intracranial atherosclerotic calcification at the skull base. Skull: Calvarium is unremarkable. Sinuses/Orbits: No acute finding. Other: None. IMPRESSION: No acute intracranial hemorrhage, mass effect, or evidence of acute infarction. Small area of left parietal encephalomalacia. Electronically Signed   By: Macy Mis M.D.   On: 04/27/2020 15:08   MR BRAIN W WO CONTRAST  Addendum Date: 04/28/2020   ADDENDUM REPORT: 04/28/2020 22:19 ADDENDUM: In addition to the initially described findings, the initially described left parietal defect could also potentially reflect a small focal cortical dysplasia rather than a remote ischemic insult. Electronically Signed   By: Jeannine Boga M.D.   On: 04/28/2020 22:19   Result Date: 04/28/2020 CLINICAL DATA:  Initial evaluation for acute seizure. EXAM: MRI HEAD WITHOUT AND WITH CONTRAST TECHNIQUE: Multiplanar, multiecho pulse sequences of the brain and surrounding structures were obtained without and with intravenous contrast. CONTRAST:  66mL GADAVIST GADOBUTROL 1 MMOL/ML IV SOLN COMPARISON:  Comparison made with prior CT from earlier same day. FINDINGS: Brain: Examination moderately to severely degraded by motion artifact. Cerebral volume within normal limits for age. Mild scattered T2/FLAIR hyperintensity noted within the periventricular and deep white matter both cerebral hemispheres, nonspecific, but most like related chronic microvascular ischemic disease. Small focus of cortical  encephalomalacia noted at the left parietal lobe, likely related to a remote ischemic infarct. No abnormal foci of restricted diffusion to suggest acute or subacute ischemia or changes related to seizure. Gray-white matter differentiation otherwise maintained. No other areas of chronic cortical infarction. No definite evidence for acute or chronic intracranial hemorrhage on this motion degraded exam. No appreciable mass lesion, mass effect or midline shift. No hydrocephalus or extra-axial fluid collection. Pituitary gland suprasellar region within normal limits. Midline structures intact. Pericallosal lipoma noted. No appreciable intrinsic temporal lobe abnormality. No appreciable abnormal enhancement on this motion degraded exam. Vascular: Major intracranial vascular flow voids are maintained. Skull and upper cervical spine: Craniocervical junction within normal limits. Bone marrow signal intensity normal. No scalp soft tissue abnormality. Sinuses/Orbits: Globes and orbital soft tissues demonstrate no acute finding. Paranasal sinuses are largely clear. No significant mastoid effusion. Inner ear structures grossly normal. Other: None. IMPRESSION: 1. Motion degraded exam. 2. No acute intracranial abnormality. 3. Small remote left parietal infarct with underlying mild chronic microvascular ischemic disease. 4. Incidental pericallosal lipoma. Electronically Signed: By: Jeannine Boga M.D. On: 04/28/2020 01:00   DG Chest Port 1 View  Result Date: 04/27/2020 CLINICAL DATA:  Seizure EXAM: PORTABLE CHEST 1 VIEW COMPARISON:  None. FINDINGS: Heart size is mildly enlarged. No focal airspace consolidation, pleural effusion, or pneumothorax. Osseous structures within normal limits. IMPRESSION: Mild cardiomegaly. No active pulmonary disease. Electronically Signed   By: Davina Poke D.O.   On: 04/27/2020 14:42   EEG adult  Result Date: 04/28/2020 Lora Havens, MD     04/28/2020  9:31 PM Patient Name: Jake Kirby MRN: [id number] Epilepsy Attending: Lora Havens Referring Physician/Provider: Dr Kerney Elbe Date: 04/28/2020 Duration: 24.07 mins Patient history: 54 y.o. w/ remote left parietal insult, HTN, OSA presenting with status epilepticus requiring emergent transfer to [facility] for EEG to evaluate seizure. Level of alertness:asleep/sedated AEDs during EEG study: Ativan, PHT, LEV, LCM Technical aspects: This EEG study was done with scalp electrodes positioned according to the 10-20 International system of electrode placement. Electrical activity was acquired at a sampling rate of 500Hz  and reviewed with a high frequency filter of 70Hz  and a low frequency filter of 1Hz . EEG data were recorded continuously and digitally stored. Description:  Sleep was characterized by vertex waves, sleep spindles (12 to 14 Hz), maximal frontocentral region.  There is an excessive amount of 15 to 18 Hz  beta activity distributed symmetrically and diffusely.  Hyperventilation and photic stimulation were not performed.   ABNORMALITY -Excessive beta, generalized IMPRESSION: This study is within normal limits. The excessive beta activity seen in the background is most likely due to the effect of benzodiazepine and is a benign EEG pattern. No seizures or epileptiform discharges were seen throughout the recording. Priyanka Barbra Sarks   Overnight EEG with video  Result Date: 04/29/2020 Lora Havens, MD     04/29/2020  7:26 AM Patient Name: Jake Kirby MRN: [id number] Epilepsy Attending: Lora Havens Referring Physician/Provider: Dr Kerney Elbe Duration: 04/28/2020 2113 to 04/29/2020 0730  Patient history: 54 y.o. w/ remote left parietal insult, HTN, OSA presenting with status epilepticus requiring emergent transfer to [facility] for EEG to evaluate seizure.  Level of alertness: awake, asleep  AEDs during EEG study: PHT, LEV, LCM  Technical aspects: This EEG study was done with scalp electrodes positioned according to  the 10-20 International system of electrode placement. Electrical activity was acquired at a sampling rate of 500Hz  and reviewed with a high frequency filter of 70Hz  and a low frequency filter of 1Hz . EEG data were recorded continuously and digitally stored.  Description:  The posterior dominant rhythm consists of 9-10 Hz activity of moderate voltage (25-35 uV) seen predominantly in posterior head regions, symmetric and reactive to eye opening and eye closing. Sleep was characterized by vertex waves, sleep spindles (12 to 14 Hz), maximal frontocentral region.  Hyperventilation and photic stimulation were not performed.   IMPRESSION: This study is within normal limits. No seizures or epileptiform discharges were seen throughout the recording. [place]   DG FLUORO GUIDED LOC OF NEEDLE/CATH TIP FOR SPINAL INJECT LT  Result Date: 04/28/2020 CLINICAL DATA:  Altered mental status. EXAM: DIAGNOSTIC LUMBAR PUNCTURE UNDER FLUOROSCOPIC GUIDANCE FLUOROSCOPY TIME:  Fluoroscopy Time:  0 minutes 7 seconds. Radiation Exposure Index (if provided by the fluoroscopic device): 19.2 mGy PROCEDURE: After discussing the risks and benefits of this procedure with the patient and patient's wife informed consent was obtained. Back was sterilely prepped and draped. Following local anesthesia with 1% lidocaine a 22 gauge spinal needle was advanced into the L5-S1 space and 10 cc of CSF obtained and sent order labs. The CSF was slightly blood-tinged. IMPRESSION: Successful fluoroscopically directed lumbar puncture. 10 cc of CSF obtained and sent to ordered labs. CSF was slightly blood-tinged. Electronically Signed   By: Marcello Moores  Register   On: 04/28/2020 09:43    Medications:  Scheduled: . chlorhexidine  15 mL Mouth Rinse BID  . Chlorhexidine Gluconate Cloth  6 each Topical Daily  . heparin  5,000 Units Subcutaneous  Q8H  . mouth rinse  15 mL Mouth Rinse q12n4p  . phenytoin (DILANTIN) IV  100 mg Intravenous Q8H    Continuous: . acyclovir Stopped (04/29/20 0647)  . ampicillin (OMNIPEN) IV 2 g (04/29/20 0721)  . cefTRIAXone (ROCEPHIN)  IV 2 g (04/29/20 0945)  . lacosamide (VIMPAT) IV Stopped (04/28/20 2318)  . levETIRAcetam    . levETIRAcetam    . magnesium sulfate bolus IVPB 2 g (04/29/20 0946)  . vancomycin 1,250 mg (04/29/20 0807)    LTM EEG report for this AM: Description:  The posterior dominant rhythm consists of 9-10 Hz activity of moderate voltage (25-35 uV) seen predominantly in posterior head regions, symmetric and reactive to eye opening and eye closing. Sleep was characterized by vertex waves, sleep spindles (12 to 14 Hz), maximal frontocentral region. Hyperventilation and photic stimulation were not performed. Event button was pressed on 04/29/2020 at 0808 and at 1021 On video, patient was noted to have whole body non rhythmic asynchronous jerking movement. Concomitant eeg before, during and after the event didn't show any eeg change to suggest seizure.  IMPRESSION: This study is within normal limits. No seizures or epileptiform discharges were seen throughout the recording. Two events were recorded on 04/29/2020 as described above without concomitant eeg change and was not epileptic.    Assessment:54 y.o. male with a history of HTN and OSA, presenting with status epilepticus requiring emergent transfer to [facility] for LTM, close monitoring and intubation.  1. Status epilepticus- while presenting spells were not captured on EEG, cannot r/o he had epileptic seizures; staff that observed the events felt that they were highly consistent with epileptic seizures. Of note, only non-epileptic spells were captured on EEG. Benefits/risks favor continuing Keppra with discontinuation of Vimpat and phenytoin.    - D/C VImpat and Dilatin at this time.   - No further concern for CNS infection at this time. LP not c/w infection.  - MRI demonstrates small remote left parietal infarct per rad read. Per  neurologist review, this could be a focal region of cortical dysplasia, which can increase risk of seizure. While these seizures usually present during childhood, they do less commonly present during adulthood as well. There is also a pericallosal lipoma, which increases the probability of associated cerebral malformation and seizure risk.   2. Morbid obesity and untreated OSA- enc pt to use Cpap.   3. Encephalopathy- toxic-metabolic effect d/t the copious amount of drugs and sedation with benzos received. Appears fatigued, but is A&Ox4.   Recommendations: 1. D/C Dilantin and Vimpat   2. Discontinuing LTM EEG.  3. Continue with Keppra 500mg  BID PO, given structural abnormality as discussed above.  4. D/C antibiotics and acyclovir  5. When the patient is completely back to baseline, outpatient seizure precautions should be discussed with him: Per [facility] statutes, patients with seizures are not allowed to drive until  they have been seizure-free for six months. Use caution when using heavy equipment or power tools. Avoid working on ladders or at heights. Take showers instead of baths. Ensure the water temperature is not too high on the home water heater. Do not go swimming alone. When caring for infants or small children, sit down when holding, feeding, or changing them to minimize risk of injury to the child in the event you have a seizure. Also, Maintain good sleep hygiene. Avoid alcohol.    LOS: 1 day   Desiree Metzger-Cihelka, ARNP-C, ANVP-BC Pager: [phone]  Electronically signed: Dr. Kerney Elbe

## 2020-04-29 NOTE — Progress Notes (Signed)
LTM maint complete - no skin breakdown under: Fp1 F3 F7 A2

## 2020-04-29 NOTE — Progress Notes (Signed)
Pharmacy Electrolyte Replacement  Recent Labs:  Recent Labs    04/29/20 0511  K 3.7  MG 1.8  PHOS 3.0  CREATININE 1.02    Low Critical Values (K </= 2.5, Phos </= 1, Mg </= 1) Present: None   Plan:  - Mg 1.8 - replace with 2g IV x 1 - F/u Mg recheck with AM labs  Thank you for allowing pharmacy to be a part of this patient's care.  Alycia Rossetti, PharmD, BCPS Clinical Pharmacist Clinical phone for 04/29/2020: [phone] 04/29/2020 8:04 AM   **Pharmacist phone directory can now be found on [email or web address] ([email or web address]).  Listed under [place].

## 2020-04-29 NOTE — Progress Notes (Signed)
Pt had a seizure lasting approximately 2 minutes. It began with both eyes blinking rapidly then generalized shaking more so on left arm. 2mg  ativan given. Pt event button pushed on EEG when seizure began. Dr. Cheral Marker made aware.

## 2020-04-29 NOTE — Progress Notes (Signed)
LTM EEG discontinued - no skin breakdown at unhook.   

## 2020-04-29 NOTE — Procedures (Addendum)
Patient Name: Jake Kirby  MRN: [id number]  Epilepsy Attending: Lora Havens  Referring Physician/Provider: Dr Kerney Elbe Duration: 04/28/2020 2113 to 04/29/2020 1157  Patient history: 54 y.o. w/ remote left parietal insult, HTN, OSA presenting with status epilepticus requiring emergent transfer to [facility] for EEG to evaluate seizure.   Level of alertness: awake, asleep  AEDs during EEG study: PHT, LEV, LCM  Technical aspects: This EEG study was done with scalp electrodes positioned according to the 10-20 International system of electrode placement. Electrical activity was acquired at a sampling rate of 500Hz  and reviewed with a high frequency filter of 70Hz  and a low frequency filter of 1Hz . EEG data were recorded continuously and digitally stored.   Description:  The posterior dominant rhythm consists of 9-10 Hz activity of moderate voltage (25-35 uV) seen predominantly in posterior head regions, symmetric and reactive to eye opening and eye closing. Sleep was characterized by vertex waves, sleep spindles (12 to 14 Hz), maximal frontocentral region.  Hyperventilation and photic stimulation were not performed.     Event button was pressed on 04/29/2020 at 0808 and at 1021 On video, patient was noted to have whole body non rhythmic asynchronous jerking movement. Concomitant eeg before, during and after the event didn't show any eeg change to suggest seizure.  IMPRESSION: This study is within normal limits. No seizures or epileptiform discharges were seen throughout the recording.  Two events were recorded on 04/29/2020 as described above without concomitant eeg change and was not epileptic.   [name]

## 2020-04-29 NOTE — Progress Notes (Signed)
NAME:  Jake Kirby, MRN:  [id number], DOB:  [birth year], LOS: 1 ADMISSION DATE:  04/27/2020, CONSULTATION DATE:  04/28/2020 REFERRING MD:  Dr. Roosevelt Locks, CHIEF COMPLAINT:  New onset seizures    Brief History   54yo male presented with new onset seizure  History of present illness   Jake Kirby is a 54yo male with a PMH significant for HTN, obesity, OSA not on CPAP at baseline who presented with new onset seizure activity. Family reported that patient was no quite himself afternoon of 10/7 family then noticed his arm was shaking and patient then fell to ground. EMS was called and on arrival a 1-2 min seizure was seen for which he received versed with resolution of initial seizure. Patient denied any drug or alcohol abuse.   On arrival to ED patents initial workup was unremarkable with stable vital signs. He underwent MRI brain that revealed small remote left parietal infarct with underlying mild chronic microvascular ischemic disease.Incidental pericallosal lipoma. Neurology was consulted and recommended loading with keppra and routine EEG.  Patient continued to have short seizure activity in the emergency department. When seizure activity persisted recommendations were made to begin broad spectrum antibiotics and acyclovir and transfer to [facility] for further neurology management and continuous EEG. Prior to transfer patient underwent LP.   Past Medical History  HTN, obesity, OSA not on CPAP at baseline   Significant Hospital Events   10/7 >> Admitted  10/8 >> [facility] to [facility] for cEEG  Consults:  Neurology   Procedures:  N/A  Significant Diagnostic Tests:  CXR 10/7 > Mild cardiomegally Head CT 10/7 > No acute intracranial hemorrhage, mass effect, or evidence of acute infarction.Small area of left parietal encephalomalacia. MRI brain 10/7 > No acute intracranial abnormality. Small remote left parietal infarct with underlying mild chronic microvascular ischemic disease.Incidental pericallosal  lipoma. cEEG 10/8 - 10/9 > Negative for seizures or epileptiform discharges.   Micro Data:  COVID 10/7 >> Negative  Blood culture 10/8 >> CSF 10/8 >>  Antimicrobials:  Acyclovir 10/8 > Ceftriaxone 10/8 > Vancomycin 10/8 >  Ampicillin 10/8 >  Interim history/subjective:   Seizure reported by RN this AM around 8:20 am, last seizure last night around 8-9 pm. EEG did not demonstrate any seizures during the night time.    This afternoon, patient is sleepy but states he is not in any pain at this time, including headache, chest pain. Denies SOB.    Objective    Vitals:   04/29/20 0800 04/29/20 0900  BP: 107/70 132/75  Pulse: 70 70  Resp: 19 17  Temp: 98.6 F (37 C)   SpO2: 99% 98%    Intake/Output Summary (Last 24 hours) at 04/29/2020 0945 Last data filed at 04/29/2020 0800 Gross per 24 hour  Intake 2454.3 ml  Output 2200 ml  Net 254.3 ml   Physical Exam Vitals and nursing note reviewed.  Constitutional:      General: He is sleeping. He is not in acute distress.    Appearance: He is obese.  HENT:     Head: Normocephalic and atraumatic.  Cardiovascular:     Rate and Rhythm: Normal rate and regular rhythm.     Heart sounds: No murmur heard.   Pulmonary:     Effort: Pulmonary effort is normal.     Breath sounds: No decreased breath sounds, wheezing, rhonchi or rales.     Comments: Loud snoring when dozing off Skin:    General: Skin is warm and dry.  Neurological:  General: No focal deficit present.     Mental Status: He is oriented to person, place, and time and easily aroused.  Psychiatric:        Behavior: Behavior is cooperative.    Resolved Hospital Problem list   N/A  Assessment & Plan:   # New onset seizure with concern for status epilepticus # ? Meningitis - Neurology on board; appreciate their recommendations  - LP demonstrates a bloody tap with WBC of 28 concerning for meningitis. WBC is inappropriately elevated given the high RBCs. Mildly  elevated protein with high-end of normal glucose.   - Patient was started on meningitis coverage with ampicillin, ceftriaxone, acyclovir, vancomycin, however will discuss discontinuing at this point.  - Following CSF cultures; so far, NGTD. HSV and Varicella serology pending as well. - Patient loaded with Keppra, Phenytoin and Vimpat.  - Ordered additional 500 mg of Keppra, increased scheduled Keppra to 1500 mg BID this morning after occurrence of seizure-like activity. Discussed with Dr. Cheral Marker, The EEG was evaluated and showed non-epileptiform activity. Given this, will go ahead and de-escalate back to original dose of Keppra (1000 mg BID) and follow up Neurology's final recommendations.  - Question if HTN as etiology of initial seizures on admission. At this time, etiology of non-epileptic seizure-like activity is undetermined. May be stress response.   # Hypertension - Recently started on oral antihypertensives 1 month prior to admission due to SBP 160-190 with associated headache and blurry vision  - Home medications include Lisinopril and Nebivolol  - Holding at this time - Blood pressure stable overnight without intervention - Continue with PRN Hydralazine - Restart home medications if BP increases   # Hx of OSA not on CPAP at baseline  - Monitor respiratory effort and drive closely, given multiple AED in the setting of OSA hypercapnia may develop  - CPAP ordered for night-time  Best practice:  Diet: NPO Pain/Anxiety/Delirium protocol (if indicated): PRNs VAP protocol (if indicated): N/A DVT prophylaxis: Subq heparin  GI prophylaxis: PPI Glucose control: SSI Mobility: Bedrest Code Status: Full Family Communication: Patient declined any family updated today, including his wife.  Disposition: ICU. Possible transfer to floor today   Labs    CBC Latest Ref Rng & Units 04/29/2020 04/28/2020 04/28/2020  WBC 4.0 - 10.5 K/uL 5.9 5.6 5.5  Hemoglobin 13.0 - 17.0 g/dL 13.6 14.1 13.3    Hematocrit 39 - 52 % 44.3 45.4 40.9  Platelets 150 - 400 K/uL 237 247 222   BMP Latest Ref Rng & Units 04/29/2020 04/28/2020 04/28/2020  Glucose 70 - 99 mg/dL 102(H) - 109(H)  BUN 6 - 20 mg/dL 10 - 14  Creatinine 0.61 - 1.24 mg/dL 1.02 0.84 0.89  Sodium 135 - 145 mmol/L 141 - 141  Potassium 3.5 - 5.1 mmol/L 3.7 - 3.4(L)  Chloride 98 - 111 mmol/L 105 - 104  CO2 22 - 32 mmol/L 27 - 30  Calcium 8.9 - 10.3 mg/dL 8.4(L) - 8.5(L)   Lab Results  Component Value Date   PHENYTOIN 12.4 04/29/2020   Review of Systems:   Negative except as noted above.   Allergies No Known Allergies   Home Medications  Prior to Admission medications   Medication Sig Start Date End Date Taking? Authorizing Provider  lisinopril (PRINIVIL,ZESTRIL) 20 MG tablet Take 20 mg by mouth daily. 07/25/18  Yes [provider]  nebivolol (BYSTOLIC) 5 MG tablet Take 5 mg by mouth daily.   Yes [provider]  lacosamide 200 mg in  sodium chloride 0.9 % 25 mL Inject 200 mg into the vein every 12 (twelve) hours. 04/28/20   Sharen Hones, MD  levETIRAcetam (KEPPRA) 1000 MG/100ML SOLN Inject 100 mLs (1,000 mg total) into the vein every 12 (twelve) hours. 04/28/20   Sharen Hones, MD  phenytoin 300 mg in sodium chloride 0.9 % 100 mL Inject 300 mg into the vein every 8 (eight) hours. 04/28/20   Sharen Hones, MD    Dr. Jose Persia Internal Medicine PGY-2  Pager: [phone]  04/29/2020, 10:01 AM

## 2020-04-30 ENCOUNTER — Inpatient Hospital Stay (HOSPITAL_COMMUNITY): Payer: Commercial Managed Care - PPO

## 2020-04-30 DIAGNOSIS — I10 Essential (primary) hypertension: Secondary | ICD-10-CM

## 2020-04-30 DIAGNOSIS — R7303 Prediabetes: Secondary | ICD-10-CM

## 2020-04-30 LAB — BASIC METABOLIC PANEL
Anion gap: 8 (ref 5–15)
BUN: 8 mg/dL (ref 6–20)
CO2: 28 mmol/L (ref 22–32)
Calcium: 8.7 mg/dL — ABNORMAL LOW (ref 8.9–10.3)
Chloride: 105 mmol/L (ref 98–111)
Creatinine, Ser: 0.89 mg/dL (ref 0.61–1.24)
GFR, Estimated: >60 mL/min (ref >60)
Glucose, Bld: 109 mg/dL — ABNORMAL HIGH (ref 70–99)
Potassium: 3.5 mmol/L (ref 3.5–5.1)
Sodium: 141 mmol/L (ref 135–145)

## 2020-04-30 LAB — MAGNESIUM: Magnesium: 2.2 mg/dL (ref 1.7–2.4)

## 2020-04-30 LAB — HSV DNA BY PCR (REFERENCE LAB)
HSV 1 DNA: NEGATIVE
HSV 2 DNA: NEGATIVE

## 2020-04-30 MED ORDER — LORAZEPAM 2 MG/ML IJ SOLN
INTRAMUSCULAR | Status: AC
  Administered 2020-04-30: 1 mg
  Filled 2020-04-30: qty 1

## 2020-04-30 MED ORDER — OXYCODONE HCL 5 MG PO TABS
5.0000 mg | ORAL_TABLET | Freq: Four times a day (QID) | ORAL | Status: DC | PRN
  Administered 2020-04-30 – 2020-05-02 (×5): 5 mg via ORAL
  Filled 2020-04-30 (×5): qty 1

## 2020-04-30 MED ORDER — ACETAMINOPHEN 325 MG PO TABS
650.0000 mg | ORAL_TABLET | Freq: Four times a day (QID) | ORAL | Status: DC | PRN
  Administered 2020-04-30: 650 mg via ORAL
  Filled 2020-04-30: qty 2

## 2020-04-30 NOTE — Progress Notes (Signed)
At about 0219 Pt began to have another clonic tonic seizure that lasted for about 53mins, 1mg  ativan was administered, Neurologist on call was notified.

## 2020-04-30 NOTE — Plan of Care (Signed)
Called for sz.  Advised Ativan if sz>9min. Sz resolved in 3 min. May consider repeat spot EEG or LTM if continues to have seizures to ensure real seizures are not missed.  -- Amie Portland, MD

## 2020-04-30 NOTE — Progress Notes (Signed)
LTM EEG hooked up and running - no initial skin breakdown - push button tested - neuro notified.  

## 2020-04-30 NOTE — Progress Notes (Signed)
Due to recurrence of spells overnight, possibility of epileptic seizures in conjunction with pseudoseizures will need to be further investigated. LTM EEG has been ordered.   Electronically signed: Dr. Kerney Elbe

## 2020-04-30 NOTE — Progress Notes (Signed)
PROGRESS NOTE    Jake Kirby  KDT:[id number] DOB: 03-06-1966 DOA: 04/28/2020 PCP: Cletis Athens, MD   Brief Narrative:  HPI per Dr. Ralene Muskrat on 04/27/20 Jake Kirby is a 54 y.o. male with medical history significant of obesity, hypertension, obstructive sleep apnea not on home CPAP, tobacco use who presents to the emergency department for evaluation of new onset seizure.  My evaluation patient is lethargic and postictal and unable to provide history.  Majority the history is gained by reading the medical record, speaking with ED provider, speaking with patient's wife at bedside.  Apparently the patient was in his normal state of health when he noted to his family that he did not quite feel right.  The family noticed his arm shaking and he fell to the ground.  Initial concern was for syncopal event.  EMS was called and the patient was confused and responsive.  The patient had a witnessed generalized 1 to 2-minute seizure.  Patient was given Versed with good abortive effect on route.  He continued to have seizures despite this.  Patient has no known history of drug or alcohol use.  No recent changes in medications.  History limited by postictal state and altered mentation  ED Course: On presentation patient underwent CAT scan demonstrating intracranial lipoma.  This is of unclear etiology.  Neurology was consulted with recommendations for admission.  Patient was loaded with intravenous Keppra 2000 mg and hospital service was called for admission.  Patient's vital signs remained stable during ER course  **Interim History Patient was seen in the emergency department at [facility] to continue to have short seizure activity in the ED.  Was ureterectomy persisted recommendations were made to begin broad-spectrum antibiotics and acyclovir and transferred to call for further neurological management and continues EEG.  Patient underwent LP prior to transfer which suggested a traumatic tap.  He was  transferred to [facility] on 04/28/2020 and because of concern for possible airway compromise was transferred to the ICU and then subsequently transferred out to the stepdown unit.  Neurology is following closely and overnight he had 2 seizure-like episodes neurology recommending resuming LTM.  Currently he is on the left heart hospital service as of 04/30/2020  Assessment & Plan:   Active Problems:   Seizure ([place])  New onset seizure with concern for status epilepticus Intracranial lipoma Questionable Meningitis -Patient was noted to have arm shaking activity followed by generalization of seizure at home -Multiple witnessed tonic-clonic episodes by EMS -Abortive therapy with Ativan was successful -Loaded with Keppra in ED -MRI done and showed "Motion degraded exam. No acute intracranial abnormality. Small remote left parietal infarct with underlying mild chronic microvascular ischemic disease. Incidental pericallosal lipoma." -Neurology consulted from ED and appreciate their recommendations Unclear whether intracranial lipoma has any significance to current presentation -LP demonstrates a bloody tap with WBC of 28 concerning for meningitis. WBC is inappropriately elevated given the high RBCs. Mildly elevated protein with high-end of normal glucose.   - Patient was started on meningitis coverage with ampicillin, ceftriaxone, acyclovir, vancomycin, however will discuss discontinuing at this point and defer to Neuro.  - Following CSF cultures; so far, NGTD. HSV and Varicella serology pending as well. - Patient loaded with Keppra, Phenytoin and Vimpat.   Now he is just on Keppra 500 twice daily - Ordered additional 500 mg of Keppra, increased scheduled Keppra to 1500 mg BID yesterday morning after occurrence of seizure-like activity; The EEG was evaluated and showed non-epileptiform activity. Given this,  will go ahead and de-escalate back to original dose of Keppra (1000 mg BID) and follow up  Neurology's final recommendations.  - Question if HTN as etiology of initial seizures on admission. At this time, etiology of non-epileptic seizure-like activity is undetermined. May be stress response.  -Overnight he had 2 more seizure-like episodes and neurology was on closely and recommended continuous LTM EEG monitoring -Levetiracetam 500 mg p.o. twice daily and further recommendation from neurology   Hypertension, poorly controlled -Recently started on oral antihypertensives 1 month prior to admission due to SBP 160-190 with associated headache and blurry vision  -Home medications include Lisinopril and Nebivolol  -Was Holding at this time -Blood pressure stable overnight without intervention -Continue with PRN Hydralazine 10 mg q4hprn SBP >170 -Restart home medications if BP increases  -BP   Hx of OSA not on CPAP at baseline  - Monitor respiratory effort and drive closely, given multiple AED in the setting of OSA hypercapnia may develop  - CPAP ordered for night-time  Tobacco Use -Smoking cessation counseling given -Discussed with the patient about nicotine replacement and nicotine patch  Prediabetes -HbA1c is now 5.8 -CBG's ranging from 102-109 -Patient to monitor blood sugars per protocol and will monitor carefully and if necessary and sensor NovoLog sign scale insulin AC  Obesity -Complicates overall prognosis and care -Estimated body mass index is 38.98 kg/m as calculated from the following:   Height as of this encounter: 5\' 8"  (1.727 m).   Weight as of this encounter: 116.3 kg. -Weight Loss and Dietary Counseling given  DVT prophylaxis: Heparin 5,000 units sq q8h Code Status: FULL CODE  Family Communication: Discussed with Wife over the telephone Disposition Plan: Need further clinical work-up and improvement back to baseline and evaluation and clearance by neurology  Status is: Inpatient  Remains inpatient appropriate because:Ongoing diagnostic testing needed  not appropriate for outpatient work up, Unsafe d/c plan and Inpatient level of care appropriate due to severity of illness   Dispo: The patient is from: Home              Anticipated d/c is to: Home              Anticipated d/c date is: 2 days              Patient currently is not medically stable to d/c.   Consultants:   Neurology   PCCM Transfer   Procedures:  EEG 04/28/20 This study is within normal limits. The excessive beta activity seen in the background is most likely due to the effect of benzodiazepine and is a benign EEG pattern. No seizures or epileptiform discharges were seen throughout the recording.  EEG 04/29/20 This study is within normal limits. No seizures or epileptiform discharges were seen throughout the recording.  Two events were recorded on 04/29/2020 as described above without concomitant eeg change and was not epileptic.    Antimicrobials:  Anti-infectives (From admission, onward)   Start     Dose/Rate Route Frequency Ordered Stop   04/28/20 2000  vancomycin (VANCOREADY) IVPB 1250 mg/250 mL  Status:  Discontinued        1,250 mg 166.7 mL/hr over 90 Minutes Intravenous Every 12 hours 04/28/20 1122 04/29/20 1404   04/28/20 1400  acyclovir (ZOVIRAX) 900 mg in dextrose 5 % 150 mL IVPB  Status:  Discontinued        10 mg/kg  90.2 kg (Adjusted) 168 mL/hr over 60 Minutes Intravenous Every 8 hours 04/28/20 1122 04/29/20 1404  04/28/20 1245  cefTRIAXone (ROCEPHIN) 2 g in sodium chloride 0.9 % 100 mL IVPB  Status:  Discontinued        2 g 200 mL/hr over 30 Minutes Intravenous Every 12 hours 04/28/20 1236 04/29/20 1404   04/28/20 1200  ampicillin (OMNIPEN) 2 g in sodium chloride 0.9 % 100 mL IVPB  Status:  Discontinued        2 g 300 mL/hr over 20 Minutes Intravenous Every 4 hours 04/28/20 1122 04/29/20 1404   04/28/20 1130  cefTRIAXone (ROCEPHIN) injection 2 g  Status:  Discontinued        2 g Intramuscular Every 12 hours 04/28/20 1122 04/28/20 1236          Subjective: Seen and examined at bedside this morning he was feeling groggy.  He is stating that his back is hurting.  Denies any chest pain, lightheadedness or dizziness.  Had 2 seizure-like episodes overnight so is been hooked up back to continuous EEG monitoring.  No other concerns or complaints at this time and wife was updated via telephone  Objective: Vitals:   04/30/20 0221 04/30/20 0326 04/30/20 0700 04/30/20 1224  BP: (!) 177/99 (!) 176/90 (!) 170/88 (!) 160/94  Pulse:  87  78  Resp:  18    Temp: 98.7 F (37.1 C) 98.3 F (36.8 C) 99.8 F (37.7 C) 99.1 F (37.3 C)  TempSrc: Axillary Oral Oral Axillary  SpO2:  100%    Weight:  116.3 kg    Height:        Intake/Output Summary (Last 24 hours) at 04/30/2020 1358 Last data filed at 04/30/2020 0900 Gross per 24 hour  Intake 1463 ml  Output 3225 ml  Net -1762 ml   Filed Weights   04/28/20 1040 04/29/20 0500 04/30/20 0326  Weight: 123 kg 124.7 kg 116.3 kg   Examination: Physical Exam:  Constitutional: WN/WD obese African-American male currently in NAD and appears calm does appear a little fatigued Eyes: Lids and conjunctivae normal, sclerae anicteric  ENMT: External Ears, Nose appear normal. Grossly normal hearing.  Neck: Appears normal, supple, no cervical masses, normal ROM, no appreciable thyromegaly; no JVD Respiratory: Diminished to auscultation bilaterally, no wheezing, rales, rhonchi or crackles. Normal respiratory effort and patient is not tachypenic. No accessory muscle use.  Unlabored breathing Cardiovascular: RRR, no murmurs / rubs / gallops. S1 and S2 auscultated.  Trace extremity edema Abdomen: Soft, non-tender, distended secondary body habitus. Bowel sounds positive.  GU: Deferred. Musculoskeletal: No clubbing / cyanosis of digits/nails. No joint deformity upper and lower extremities.  Skin: No rashes, lesions, ulcers on limited skin evaluation. No induration; Warm and dry.  Neurologic: CN 2-12 grossly  intact with no focal deficits. Romberg sign and cerebellar reflexes not assessed.  Psychiatric: Normal judgment and insight. Alert and oriented x 3.  Sleepy mood and appropriate affect.   Data Reviewed: I have personally reviewed following labs and imaging studies  CBC: Recent Labs  Lab 04/27/20 1428 04/27/20 1818 04/28/20 0656 04/28/20 1126 04/29/20 0511  WBC 5.8 4.9 5.5 5.6 5.9  NEUTROABS  --   --  3.7  --   --   HGB 13.3 12.9* 13.3 14.1 13.6  HCT 40.9 40.1 40.9 45.4 44.3  MCV 86.1 86.8 86.7 87.6 87.9  PLT 188 226 222 247 220   Basic Metabolic Panel: Recent Labs  Lab 04/27/20 1428 04/27/20 1428 04/27/20 1818 04/28/20 0656 04/28/20 1126 04/29/20 0511 04/30/20 0537  NA 139  --   --  141  --  141 141  K 3.9  --   --  3.4*  --  3.7 3.5  CL 102  --   --  104  --  105 105  CO2 25  --   --  30  --  27 28  GLUCOSE 114*  --   --  109*  --  102* 109*  BUN 20  --   --  14  --  10 8  CREATININE 1.19   < > 1.02 0.89 0.84 1.02 0.89  CALCIUM 8.6*  --   --  8.5*  --  8.4* 8.7*  MG  --   --  2.1  --   --  1.8 2.2  PHOS  --   --   --   --   --  3.0  --    < > = values in this interval not displayed.   GFR: Estimated Creatinine Clearance: 118.9 mL/min (by C-G formula based on SCr of 0.89 mg/dL). Liver Function Tests: Recent Labs  Lab 04/27/20 1428 04/28/20 0656  AST 23 16  ALT 16 15  ALKPHOS 59 50  BILITOT 0.9 0.6  PROT 6.7 6.9  ALBUMIN 3.7 3.7   No results for input(s): LIPASE, AMYLASE in the last 168 hours. No results for input(s): AMMONIA in the last 168 hours. Coagulation Profile: Recent Labs  Lab 04/27/20 1518  INR 1.0   Cardiac Enzymes: No results for input(s): CKTOTAL, CKMB, CKMBINDEX, TROPONINI in the last 168 hours. BNP (last 3 results) No results for input(s): PROBNP in the last 8760 hours. HbA1C: Recent Labs    04/29/20 0511  HGBA1C 5.8*   CBG: Recent Labs  Lab 04/27/20 2035 04/28/20 1111 04/29/20 2017  GLUCAP 110* 101* 116*   Lipid  Profile: No results for input(s): CHOL, HDL, LDLCALC, TRIG, CHOLHDL, LDLDIRECT in the last 72 hours. Thyroid Function Tests: No results for input(s): TSH, T4TOTAL, FREET4, T3FREE, THYROIDAB in the last 72 hours. Anemia Panel: No results for input(s): VITAMINB12, FOLATE, FERRITIN, TIBC, IRON, RETICCTPCT in the last 72 hours. Sepsis Labs: No results for input(s): PROCALCITON, LATICACIDVEN in the last 168 hours.  Recent Results (from the past 240 hour(s))  Respiratory Panel by RT PCR (Flu A&B, Covid) - Nasopharyngeal Swab     Status: None   Collection Time: 04/27/20  2:28 PM   Specimen: Nasopharyngeal Swab  Result Value Ref Range Status   SARS Coronavirus 2 by RT PCR NEGATIVE NEGATIVE Final    Comment: (NOTE) SARS-CoV-2 target nucleic acids are NOT DETECTED.  The SARS-CoV-2 RNA is generally detectable in upper respiratoy specimens during the acute phase of infection. The lowest concentration of SARS-CoV-2 viral copies this assay can detect is 131 copies/mL. A negative result does not preclude SARS-Cov-2 infection and should not be used as the sole basis for treatment or other patient management decisions. A negative result may occur with  improper specimen collection/handling, submission of specimen other than nasopharyngeal swab, presence of viral mutation(s) within the areas targeted by this assay, and inadequate number of viral copies (<131 copies/mL). A negative result must be combined with clinical observations, patient history, and epidemiological information. The expected result is Negative.  Fact Sheet for Patients:  PinkCheek.be  Fact Sheet for Healthcare Providers:  GravelBags.it  This test is no t yet approved or cleared by the [place] FDA and  has been authorized for detection and/or diagnosis of SARS-CoV-2 by FDA under an Emergency Use Authorization (EUA). This EUA will remain  in effect (  meaning this  test can be used) for the duration of the COVID-19 declaration under Section 564(b)(1) of the Act, 21 U.S.C. section 360bbb-3(b)(1), unless the authorization is terminated or revoked sooner.     Influenza A by PCR NEGATIVE NEGATIVE Final   Influenza B by PCR NEGATIVE NEGATIVE Final    Comment: (NOTE) The Xpert Xpress SARS-CoV-2/FLU/RSV assay is intended as an aid in  the diagnosis of influenza from Nasopharyngeal swab specimens and  should not be used as a sole basis for treatment. Nasal washings and  aspirates are unacceptable for Xpert Xpress SARS-CoV-2/FLU/RSV  testing.  Fact Sheet for Patients: PinkCheek.be  Fact Sheet for Healthcare Providers: GravelBags.it  This test is not yet approved or cleared by the [place] FDA and  has been authorized for detection and/or diagnosis of SARS-CoV-2 by  FDA under an Emergency Use Authorization (EUA). This EUA will remain  in effect (meaning this test can be used) for the duration of the  Covid-19 declaration under Section 564(b)(1) of the Act, 21  U.S.C. section 360bbb-3(b)(1), unless the authorization is  terminated or revoked. Performed at [facility], [address] [place], [place] [address]   CSF culture     Status: None (Preliminary result)   Collection Time: 04/28/20  7:42 AM   Specimen: CSF; Cerebrospinal Fluid  Result Value Ref Range Status   Specimen Description   Final    CSF Performed at [facility], [address] [place], [place] [address]    Special Requests   Final    NONE Performed at [facility], [address] [place], [place] [address]    Gram Stain   Final    NO ORGANISMS SEEN WBC SEEN RED BLOOD CELLS PRESENT Performed at [facility], [address], [place], [place] [address]    Culture   Final    NO GROWTH 2 DAYS Performed at [facility], [place] [address], [place], [place] [address]     Report Status PENDING  Incomplete  Culture, blood (routine x 2)     Status: None (Preliminary result)   Collection Time: 04/28/20  7:43 AM   Specimen: BLOOD  Result Value Ref Range Status   Specimen Description BLOOD LEFT ASSIST CONTROL  Final   Special Requests   Final    BOTTLES DRAWN AEROBIC AND ANAEROBIC Blood Culture results may not be optimal due to an inadequate volume of blood received in culture bottles   Culture   Final    NO GROWTH 2 DAYS Performed at [facility], [address] [place], [place] [address]    Report Status PENDING  Incomplete  Culture, blood (routine x 2)     Status: None (Preliminary result)   Collection Time: 04/28/20  7:43 AM   Specimen: BLOOD  Result Value Ref Range Status   Specimen Description BLOOD RIGHT HAND  Final   Special Requests   Final    BOTTLES DRAWN AEROBIC AND ANAEROBIC Blood Culture results may not be optimal due to an inadequate volume of blood received in culture bottles   Culture   Final    NO GROWTH 2 DAYS Performed at [facility], [address], [place],  [address]    Report Status PENDING  Incomplete  MRSA PCR Screening     Status: None   Collection Time: 04/28/20  7:14 PM   Specimen: Nasal Mucosa; Nasopharyngeal  Result Value Ref Range Status   MRSA by PCR NEGATIVE NEGATIVE  Final    Comment:        The GeneXpert MRSA Assay (FDA approved for NASAL specimens only), is one component of a comprehensive MRSA colonization surveillance program. It is not intended to diagnose MRSA infection nor to guide or monitor treatment for MRSA infections. Performed at [facility], [place] [address] [place], [place] [address]     RN Pressure Injury Documentation:     Estimated body mass index is 38.98 kg/m as calculated from the following:   Height as of this encounter: 5\' 8"  (1.727 m).   Weight as of this encounter: 116.3 kg.  Malnutrition Type:      Malnutrition Characteristics:       Nutrition Interventions:    Radiology Studies: EEG adult  Result Date: 26-May-2020 Lora Havens, MD     05/26/2020  9:31 PM Patient Name: Jake Kirby MRN: [id number] Epilepsy Attending: Lora Havens Referring Physician/Provider: Dr Kerney Elbe Date: 05-26-20 Duration: 24.07 mins Patient history: 55 y.o. w/ remote left parietal insult, HTN, OSA presenting with status epilepticus requiring emergent transfer to [facility] for EEG to evaluate seizure. Level of alertness:asleep/sedated AEDs during EEG study: Ativan, PHT, LEV, LCM Technical aspects: This EEG study was done with scalp electrodes positioned according to the 10-20 International system of electrode placement. Electrical activity was acquired at a sampling rate of 500Hz  and reviewed with a high frequency filter of 70Hz  and a low frequency filter of 1Hz . EEG data were recorded continuously and digitally stored. Description:  Sleep was characterized by vertex waves, sleep spindles (12 to 14 Hz), maximal frontocentral region.  There is an excessive amount of 15 to 18 Hz  beta activity distributed symmetrically and diffusely.  Hyperventilation and photic stimulation were not performed.   ABNORMALITY -Excessive beta, generalized IMPRESSION: This study is within normal limits. The excessive beta activity seen in the background is most likely due to the effect of benzodiazepine and is a benign EEG pattern. No seizures or epileptiform discharges were seen throughout the recording. Priyanka Barbra Sarks   Overnight EEG with video  Result Date: 04/29/2020 Lora Havens, MD     04/29/2020 12:19 PM Patient Name: Jake Kirby MRN: [id number] Epilepsy Attending: Lora Havens Referring Physician/Provider: Dr Kerney Elbe Duration: 05-26-2020 2113 to 04/29/2020 1157  Patient history: 54 y.o. w/ remote left parietal insult, HTN, OSA presenting with status epilepticus requiring emergent transfer to [facility] for EEG to evaluate seizure.  Level  of alertness: awake, asleep  AEDs during EEG study: PHT, LEV, LCM  Technical aspects: This EEG study was done with scalp electrodes positioned according to the 10-20 International system of electrode placement. Electrical activity was acquired at a sampling rate of 500Hz  and reviewed with a high frequency filter of 70Hz  and a low frequency filter of 1Hz . EEG data were recorded continuously and digitally stored.  Description:  The posterior dominant rhythm consists of 9-10 Hz activity of moderate voltage (25-35 uV) seen predominantly in posterior head regions, symmetric and reactive to eye opening and eye closing. Sleep was characterized by vertex waves, sleep spindles (12 to 14 Hz), maximal frontocentral region.  Hyperventilation and photic stimulation were not performed.   Event button was pressed on 04/29/2020 at 0808 and at 1021 On video, patient was noted to have whole body non rhythmic asynchronous jerking movement. Concomitant eeg before, during and after the event didn't show any eeg change to suggest seizure. IMPRESSION: This study is within normal limits. No seizures  or epileptiform discharges were seen throughout the recording. Two events were recorded on 04/29/2020 as described above without concomitant eeg change and was not epileptic. Priyanka Barbra Sarks   Scheduled Meds: . Chlorhexidine Gluconate Cloth  6 each Topical Daily  . heparin  5,000 Units Subcutaneous Q8H  . levETIRAcetam  500 mg Oral BID   Continuous Infusions:   LOS: 2 days   Kerney Elbe, DO [facility] PAGER is on AMION  If 7PM-7AM, please contact night-coverage [email or web address]

## 2020-04-30 NOTE — Progress Notes (Signed)
I had left the room to get Pt's med and SCD machine, on returning to the room at about 2058,  Pt' was actively seizing, the whole body was shaking, Paged neurologist on call, he gave verbal order to administer 1mg  of Ativan if seizure continues after 45mins. Pt stopped seizing at about 2101, was able to say his name with some difficulty. Returned to baseline about 79mins later

## 2020-04-30 NOTE — Progress Notes (Signed)
This RN came into patients room and patients EEG leads were disconnected. I am unsure how they became disconnected. The EEG tech was called and paged. However, I was unable to reach them. I then notified the on call neurologist A. Rory Percy. Per MD, this is okay, "we should be able to monitor the patient with what is currently being shown on the EEG screen."

## 2020-05-01 LAB — COMPREHENSIVE METABOLIC PANEL
ALT: 14 U/L (ref 0–44)
AST: 13 U/L — ABNORMAL LOW (ref 15–41)
Albumin: 3.4 g/dL — ABNORMAL LOW (ref 3.5–5.0)
Alkaline Phosphatase: 63 U/L (ref 38–126)
Anion gap: 10 (ref 5–15)
BUN: 10 mg/dL (ref 6–20)
CO2: 27 mmol/L (ref 22–32)
Calcium: 8.7 mg/dL — ABNORMAL LOW (ref 8.9–10.3)
Chloride: 103 mmol/L (ref 98–111)
Creatinine, Ser: 1.16 mg/dL (ref 0.61–1.24)
GFR, Estimated: >60 mL/min (ref >60)
Glucose, Bld: 126 mg/dL — ABNORMAL HIGH (ref 70–99)
Potassium: 3.6 mmol/L (ref 3.5–5.1)
Sodium: 140 mmol/L (ref 135–145)
Total Bilirubin: 0.5 mg/dL (ref 0.3–1.2)
Total Protein: 6.9 g/dL (ref 6.5–8.1)

## 2020-05-01 LAB — CBC WITH DIFFERENTIAL/PLATELET
Abs Immature Granulocytes: 0.03 10*3/uL (ref 0.00–0.07)
Basophils Absolute: 0.1 10*3/uL (ref 0.0–0.1)
Basophils Relative: 1 %
Eosinophils Absolute: 0.1 10*3/uL (ref 0.0–0.5)
Eosinophils Relative: 1 %
HCT: 42.5 % (ref 39.0–52.0)
Hemoglobin: 13.2 g/dL (ref 13.0–17.0)
Immature Granulocytes: 0 %
Lymphocytes Relative: 16 %
Lymphs Abs: 1.4 10*3/uL (ref 0.7–4.0)
MCH: 27.4 pg (ref 26.0–34.0)
MCHC: 31.1 g/dL (ref 30.0–36.0)
MCV: 88.2 fL (ref 80.0–100.0)
Monocytes Absolute: 1.1 10*3/uL — ABNORMAL HIGH (ref 0.1–1.0)
Monocytes Relative: 13 %
Neutro Abs: 6 10*3/uL (ref 1.7–7.7)
Neutrophils Relative %: 69 %
Platelets: 230 10*3/uL (ref 150–400)
RBC: 4.82 MIL/uL (ref 4.22–5.81)
RDW: 13.1 % (ref 11.5–15.5)
WBC: 8.7 10*3/uL (ref 4.0–10.5)
nRBC: 0 % (ref 0.0–0.2)

## 2020-05-01 LAB — CSF CULTURE W GRAM STAIN
Culture: NO GROWTH
Gram Stain: NONE SEEN

## 2020-05-01 LAB — PHOSPHORUS: Phosphorus: 2.9 mg/dL (ref 2.5–4.6)

## 2020-05-01 LAB — MAGNESIUM: Magnesium: 2.2 mg/dL (ref 1.7–2.4)

## 2020-05-01 MED ORDER — LORAZEPAM 2 MG/ML IJ SOLN: 2.0000 mg | INTRAMUSCULAR | Status: DC | PRN

## 2020-05-01 MED ORDER — LISINOPRIL 20 MG PO TABS
20.0000 mg | ORAL_TABLET | Freq: Every day | ORAL | Status: DC
  Administered 2020-05-01 – 2020-05-02 (×2): 20 mg via ORAL
  Filled 2020-05-01 (×2): qty 1

## 2020-05-01 MED ORDER — NEBIVOLOL HCL 5 MG PO TABS
5.0000 mg | ORAL_TABLET | Freq: Every day | ORAL | Status: DC
  Administered 2020-05-02: 5 mg via ORAL
  Filled 2020-05-01 (×2): qty 1

## 2020-05-01 NOTE — Progress Notes (Signed)
PROGRESS NOTE    Jake Kirby  QJJ:[id number] DOB: [birth year] DOA: 04/28/2020 PCP: Cletis Athens, MD   Brief Narrative:  HPI per Dr. Ralene Muskrat on 04/27/20 Jake Kirby is a 54 y.o. male with medical history significant of obesity, hypertension, obstructive sleep apnea not on home CPAP, tobacco use who presents to the emergency department for evaluation of new onset seizure.  My evaluation patient is lethargic and postictal and unable to provide history.  Majority the history is gained by reading the medical record, speaking with ED provider, speaking with patient's wife at bedside.  Apparently the patient was in his normal state of health when he noted to his family that he did not quite feel right.  The family noticed his arm shaking and he fell to the ground.  Initial concern was for syncopal event.  EMS was called and the patient was confused and responsive.  The patient had a witnessed generalized 1 to 2-minute seizure.  Patient was given Versed with good abortive effect on route.  He continued to have seizures despite this.  Patient has no known history of drug or alcohol use.  No recent changes in medications.  History limited by postictal state and altered mentation  ED Course: On presentation patient underwent CAT scan demonstrating intracranial lipoma.  This is of unclear etiology.  Neurology was consulted with recommendations for admission.  Patient was loaded with intravenous Keppra 2000 mg and hospital service was called for admission.  Patient's vital signs remained stable during ER course  **Interim History Patient was seen in the emergency department at [facility] to continue to have short seizure activity in the ED.  Was ureterectomy persisted recommendations were made to begin broad-spectrum antibiotics and acyclovir and transferred to call for further neurological management and continues EEG.  Patient underwent LP prior to transfer which suggested a traumatic tap.  He was  transferred to [facility] on 04/28/2020 and because of concern for possible airway compromise was transferred to the ICU and then subsequently transferred out to the stepdown unit.  Neurology is following closely and overnight he had 2 seizure-like episodes neurology recommending resuming LTM.  Currently he is on the [facility] service as of 04/30/2020  05/01/2020: Patient spiked a temperature yesterday of 100.5 and blood cultures were obtained and he was given acetaminophen.  He had overnight EEG monitoring showed no more events overnight and the events on Saturday were nonepileptic.  Neurology recommending continuing LTM EEG monitoring again overnight and discontinue tomorrow just to be sure given the MRI finding.  Patient continued to complain of back pain.  Assessment & Plan:   Active Problems:   Seizure ([place])  New onset seizure with concern for status epilepticus rule out pseudoseizure Intracranial lipoma Questionable Meningitis -Patient was noted to have arm shaking activity followed by generalization of seizure at home -Multiple witnessed tonic-clonic episodes by EMS -Abortive therapy with Ativan was successful -Loaded with Keppra in ED -MRI done and showed "Motion degraded exam. No acute intracranial abnormality. Small remote left parietal infarct with underlying mild chronic microvascular ischemic disease. Incidental pericallosal lipoma." -Neurology consulted from ED and appreciate their recommendations Unclear whether intracranial lipoma has any significance to current presentation -LP demonstrates a bloody tap with WBC of 28 concerning for meningitis. WBC is inappropriately elevated given the high RBCs. Mildly elevated protein with high-end of normal glucose.   - Patient was started on meningitis coverage with ampicillin, ceftriaxone, acyclovir, vancomycin, however will discuss discontinuing at this point and defer to  Neuro.  - Following CSF cultures; so far, NGTD. HSV and Varicella serology  pending as well. - Patient loaded with Keppra, Phenytoin and Vimpat.   Now he is just on Keppra 500 twice daily - Ordered additional 500 mg of Keppra, increased scheduled Keppra to 1500 mg BID yesterday morning after occurrence of seizure-like activity; The EEG was evaluated and showed non-epileptiform activity. Given this, will go ahead and de-escalate back to original dose of Keppra (1000 mg BID) and follow up Neurology's final recommendations.  - Question if HTN as etiology of initial seizures on admission. At this time, etiology of non-epileptic seizure-like activity is undetermined. May be stress response.  -The night before last he had 2 more seizure-like episodes and neurology was on closely and recommended continuous LTM EEG monitoring; these episodes were presumed to be nonepileptic -Levetiracetam 500 mg p.o. twice daily will be continued and neurology recommends continuing LTM EEG monitoring tonight and if normal overnight then can be discontinued tomorrow  Hypertension, poorly controlled -Recently started on oral antihypertensives 1 month prior to admission due to SBP 160-190 with associated headache and blurry vision  -Home medications include Lisinopril and Nebivolol and will resume today given that his blood pressure is elevated -Blood pressure stable overnight without intervention -Continue with PRN Hydralazine 10 mg q4hprn SBP >170 -Restart home medications if BP increases  -BP has been elevated and was now 91/92  Hx of OSA not on CPAP at baseline  - Monitor respiratory effort and drive closely, given multiple AED in the setting of OSA hypercapnia may develop  - CPAP ordered for night-time however patient refused  Tobacco Use -Smoking cessation counseling given -Discussed with the patient about nicotine replacement and nicotine patch  Prediabetes -HbA1c is now 5.8 -CBG's ranging from 102-126 -Patient to monitor blood sugars per protocol and will monitor carefully and if  necessary and sensor NovoLog sign scale insulin AC  Obesity -Complicates overall prognosis and care -Estimated body mass index is 38.98 kg/m as calculated from the following:   Height as of this encounter: 5\' 8"  (1.727 m).   Weight as of this encounter: 116.3 kg. -Weight Loss and Dietary Counseling given  Back pain -In the setting of his seizure-like events -Started oxycodone IR 5 mg every 6 hours as needed for moderate pain -Up and out of bed and to the chair once his EEG monitoring has been done  Fever -Patient had a fever of 100.5 yesterday of unclear etiology and seems to have been improved now; appears to been an isolated event -Obtain blood cultures x2 and this showed no growth to date less than 24 hours -Continue to monitor fever and temperature curve and currently has no leukocytosis as WBC is gone from 5.9 is now 8.7X-continue monitor and trend carefully  DVT prophylaxis: Heparin 5,000 units sq q8h Code Status: FULL CODE  Family Communication: No family present at bedside but the patient's wife was updated by the Neurologist Dr. Hortense Ramal Disposition Plan: Need further clinical work-up and improvement back to baseline and evaluation and clearance by neurology  Status is: Inpatient  Remains inpatient appropriate because:Ongoing diagnostic testing needed not appropriate for outpatient work up, Unsafe d/c plan and Inpatient level of care appropriate due to severity of illness   Dispo: The patient is from: Home              Anticipated d/c is to: Home              Anticipated d/c date is: 1-2 days  Patient currently is not medically stable to d/c.   Consultants:   Neurology   PCCM Transfer   Procedures:  EEG 04/28/20 This study is within normal limits. The excessive beta activity seen in the background is most likely due to the effect of benzodiazepine and is a benign EEG pattern. No seizures or epileptiform discharges were seen throughout the  recording.  EEG 04/29/20 This study is within normal limits. No seizures or epileptiform discharges were seen throughout the recording.  Two events were recorded on 04/29/2020 as described above without concomitant eeg change and was not epileptic.    Antimicrobials:  Anti-infectives (From admission, onward)   Start     Dose/Rate Route Frequency Ordered Stop   04/28/20 2000  vancomycin (VANCOREADY) IVPB 1250 mg/250 mL  Status:  Discontinued        1,250 mg 166.7 mL/hr over 90 Minutes Intravenous Every 12 hours 04/28/20 1122 04/29/20 1404   04/28/20 1400  acyclovir (ZOVIRAX) 900 mg in dextrose 5 % 150 mL IVPB  Status:  Discontinued        10 mg/kg  90.2 kg (Adjusted) 168 mL/hr over 60 Minutes Intravenous Every 8 hours 04/28/20 1122 04/29/20 1404   04/28/20 1245  cefTRIAXone (ROCEPHIN) 2 g in sodium chloride 0.9 % 100 mL IVPB  Status:  Discontinued        2 g 200 mL/hr over 30 Minutes Intravenous Every 12 hours 04/28/20 1236 04/29/20 1404   04/28/20 1200  ampicillin (OMNIPEN) 2 g in sodium chloride 0.9 % 100 mL IVPB  Status:  Discontinued        2 g 300 mL/hr over 20 Minutes Intravenous Every 4 hours 04/28/20 1122 04/29/20 1404   04/28/20 1130  cefTRIAXone (ROCEPHIN) injection 2 g  Status:  Discontinued        2 g Intramuscular Every 12 hours 04/28/20 1122 04/28/20 1236        Subjective: Seen and examined at bedside and he still complaining of some back pain and felt that it may have been from the seizures.  Wanting to rest.  No nausea or vomiting, shortness of breath or chest pain.  Has not had any more fevers.  No other concerns or complaints at this time.  Objective: Vitals:   04/30/20 2340 05/01/20 0412 05/01/20 0755 05/01/20 1153  BP: (!) 173/87 (!) 151/92 (!) 164/85 (!) 191/92  Pulse: 86 85    Resp: 20 20 20 20   Temp: 99.2 F (37.3 C) 98.8 F (37.1 C) 99.5 F (37.5 C) 99.4 F (37.4 C)  TempSrc: Oral Oral Oral Oral  SpO2: 94% 97% 97% 94%  Weight:      Height:         Intake/Output Summary (Last 24 hours) at 05/01/2020 1321 Last data filed at 05/01/2020 0416 Gross per 24 hour  Intake --  Output 825 ml  Net -825 ml   Filed Weights   04/28/20 1040 04/29/20 0500 04/30/20 0326  Weight: 123 kg 124.7 kg 116.3 kg   Examination: Physical Exam:  Constitutional: WN/WD obese African-American male currently in no acute distress appears calm but does appear fatigued and wanting to rest and sleep Eyes: Lids and conjunctivae normal, sclerae anicteric  ENMT: External Ears, Nose appear normal. Grossly normal hearing. Neck: Appears normal, supple, no cervical masses, normal ROM, no appreciable thyromegaly; no JVD Respiratory: Diminished to auscultation bilaterally with coarse breath sounds, no wheezing, rales, rhonchi or crackles. Normal respiratory effort and patient is not tachypenic. No accessory muscle use.  Cardiovascular: RRR, no murmurs / rubs / gallops. S1 and S2 auscultated.  Minimal extremity edema Abdomen: Soft, non-tender, distended secondary to body habitus. Bowel sounds positive.  GU: Deferred. Musculoskeletal: No clubbing / cyanosis of digits/nails. No joint deformity upper and lower extremities.  Skin: No rashes, lesions, ulcers on limited skin evaluation. No induration; Warm and dry.  Neurologic: CN 2-12 grossly intact with no focal deficits. Romberg sign and cerebellar reflexes not assessed.  Psychiatric: Normal judgment and insight. Alert and oriented x 3. Normal mood and appropriate affect.   Data Reviewed: I have personally reviewed following labs and imaging studies  CBC: Recent Labs  Lab 04/27/20 1818 04/28/20 0656 04/28/20 1126 04/29/20 0511 05/01/20 0052  WBC 4.9 5.5 5.6 5.9 8.7  NEUTROABS  --  3.7  --   --  6.0  HGB 12.9* 13.3 14.1 13.6 13.2  HCT 40.1 40.9 45.4 44.3 42.5  MCV 86.8 86.7 87.6 87.9 88.2  PLT 226 222 247 237 784   Basic Metabolic Panel: Recent Labs  Lab 04/27/20 1428 04/27/20 1428 04/27/20 1818  04/27/20 1818 04/28/20 0656 04/28/20 1126 04/29/20 0511 04/30/20 0537 05/01/20 0052  NA 139  --   --   --  141  --  141 141 140  K 3.9  --   --   --  3.4*  --  3.7 3.5 3.6  CL 102  --   --   --  104  --  105 105 103  CO2 25  --   --   --  30  --  27 28 27   GLUCOSE 114*  --   --   --  109*  --  102* 109* 126*  BUN 20  --   --   --  14  --  10 8 10   CREATININE 1.19   < > 1.02   < > 0.89 0.84 1.02 0.89 1.16  CALCIUM 8.6*  --   --   --  8.5*  --  8.4* 8.7* 8.7*  MG  --   --  2.1  --   --   --  1.8 2.2 2.2  PHOS  --   --   --   --   --   --  3.0  --  2.9   < > = values in this interval not displayed.   GFR: Estimated Creatinine Clearance: 91.3 mL/min (by C-G formula based on SCr of 1.16 mg/dL). Liver Function Tests: Recent Labs  Lab 04/27/20 1428 04/28/20 0656 05/01/20 0052  AST 23 16 13*  ALT 16 15 14   ALKPHOS 59 50 63  BILITOT 0.9 0.6 0.5  PROT 6.7 6.9 6.9  ALBUMIN 3.7 3.7 3.4*   No results for input(s): LIPASE, AMYLASE in the last 168 hours. No results for input(s): AMMONIA in the last 168 hours. Coagulation Profile: Recent Labs  Lab 04/27/20 1518  INR 1.0   Cardiac Enzymes: No results for input(s): CKTOTAL, CKMB, CKMBINDEX, TROPONINI in the last 168 hours. BNP (last 3 results) No results for input(s): PROBNP in the last 8760 hours. HbA1C: Recent Labs    04/29/20 0511  HGBA1C 5.8*   CBG: Recent Labs  Lab 04/27/20 2035 04/28/20 1111 04/29/20 2017  GLUCAP 110* 101* 116*   Lipid Profile: No results for input(s): CHOL, HDL, LDLCALC, TRIG, CHOLHDL, LDLDIRECT in the last 72 hours. Thyroid Function Tests: No results for input(s): TSH, T4TOTAL, FREET4, T3FREE, THYROIDAB in the last 72 hours. Anemia Panel: No results for input(s):  VITAMINB12, FOLATE, FERRITIN, TIBC, IRON, RETICCTPCT in the last 72 hours. Sepsis Labs: No results for input(s): PROCALCITON, LATICACIDVEN in the last 168 hours.  Recent Results (from the past 240 hour(s))  Respiratory Panel by RT  PCR (Flu A&B, Covid) - Nasopharyngeal Swab     Status: None   Collection Time: 04/27/20  2:28 PM   Specimen: Nasopharyngeal Swab  Result Value Ref Range Status   SARS Coronavirus 2 by RT PCR NEGATIVE NEGATIVE Final    Comment: (NOTE) SARS-CoV-2 target nucleic acids are NOT DETECTED.  The SARS-CoV-2 RNA is generally detectable in upper respiratoy specimens during the acute phase of infection. The lowest concentration of SARS-CoV-2 viral copies this assay can detect is 131 copies/mL. A negative result does not preclude SARS-Cov-2 infection and should not be used as the sole basis for treatment or other patient management decisions. A negative result may occur with  improper specimen collection/handling, submission of specimen other than nasopharyngeal swab, presence of viral mutation(s) within the areas targeted by this assay, and inadequate number of viral copies (<131 copies/mL). A negative result must be combined with clinical observations, patient history, and epidemiological information. The expected result is Negative.  Fact Sheet for Patients:  PinkCheek.be  Fact Sheet for Healthcare Providers:  GravelBags.it  This test is no t yet approved or cleared by the [place] FDA and  has been authorized for detection and/or diagnosis of SARS-CoV-2 by FDA under an Emergency Use Authorization (EUA). This EUA will remain  in effect (meaning this test can be used) for the duration of the COVID-19 declaration under Section 564(b)(1) of the Act, 21 U.S.C. section 360bbb-3(b)(1), unless the authorization is terminated or revoked sooner.     Influenza A by PCR NEGATIVE NEGATIVE Final   Influenza B by PCR NEGATIVE NEGATIVE Final    Comment: (NOTE) The Xpert Xpress SARS-CoV-2/FLU/RSV assay is intended as an aid in  the diagnosis of influenza from Nasopharyngeal swab specimens and  should not be used as a sole basis for  treatment. Nasal washings and  aspirates are unacceptable for Xpert Xpress SARS-CoV-2/FLU/RSV  testing.  Fact Sheet for Patients: PinkCheek.be  Fact Sheet for Healthcare Providers: GravelBags.it  This test is not yet approved or cleared by the [place] FDA and  has been authorized for detection and/or diagnosis of SARS-CoV-2 by  FDA under an Emergency Use Authorization (EUA). This EUA will remain  in effect (meaning this test can be used) for the duration of the  Covid-19 declaration under Section 564(b)(1) of the Act, 21  U.S.C. section 360bbb-3(b)(1), unless the authorization is  terminated or revoked. Performed at [facility], [address] [place], [place] [address]   CSF culture     Status: None   Collection Time: 04/28/20  7:42 AM   Specimen: CSF; Cerebrospinal Fluid  Result Value Ref Range Status   Specimen Description   Final    CSF Performed at [facility], [address], [place], [place] [address]    Special Requests   Final    NONE Performed at [facility], [address] [place], [place] [address]    Gram Stain   Final    NO ORGANISMS SEEN WBC SEEN RED BLOOD CELLS PRESENT Performed at [facility], [address], [place], [place] [address]    Culture   Final    NO GROWTH 3 DAYS Performed at [facility], [place] [address], [place], [place] [address]    Report  Status 05/01/2020 FINAL  Final  Culture, blood (routine x 2)     Status: None (Preliminary result)   Collection Time: 04/28/20  7:43 AM   Specimen: BLOOD  Result Value Ref Range Status   Specimen Description BLOOD LEFT ASSIST CONTROL  Final   Special Requests   Final    BOTTLES DRAWN AEROBIC AND ANAEROBIC Blood Culture results may not be optimal due to an inadequate volume of blood received in culture bottles   Culture   Final    NO GROWTH 3 DAYS Performed at [facility], [address], [place], [place] [address]    Report Status PENDING  Incomplete  Culture, blood (routine x 2)     Status: None (Preliminary result)   Collection Time: 04/28/20  7:43 AM   Specimen: BLOOD  Result Value Ref Range Status   Specimen Description BLOOD RIGHT HAND  Final   Special Requests   Final    BOTTLES DRAWN AEROBIC AND ANAEROBIC Blood Culture results may not be optimal due to an inadequate volume of blood received in culture bottles   Culture   Final    NO GROWTH 3 DAYS Performed at [facility], [address], [place], [place] [address]    Report Status PENDING  Incomplete  MRSA PCR Screening     Status: None   Collection Time: 04/28/20  7:14 PM   Specimen: Nasal Mucosa; Nasopharyngeal  Result Value Ref Range Status   MRSA by PCR NEGATIVE NEGATIVE Final    Comment:        The GeneXpert MRSA Assay (FDA approved for NASAL specimens only), is one component of a comprehensive MRSA colonization surveillance program. It is not intended to diagnose MRSA infection nor to guide or monitor treatment for MRSA infections. Performed at [facility], [place] [address], [place], [place] [address]   Culture, blood (routine x 2)     Status: None (Preliminary result)   Collection Time: 04/30/20  5:12 PM   Specimen: BLOOD LEFT HAND  Result Value Ref Range Status   Specimen Description BLOOD LEFT HAND  Final   Special Requests   Final    BOTTLES DRAWN AEROBIC ONLY Blood Culture results may not be optimal due to an inadequate volume of blood received in culture bottles   Culture   Final    NO GROWTH < 24 HOURS Performed at [facility], [place] [address] [place], [place] [address]    Report Status PENDING  Incomplete  Culture, blood (routine x 2)     Status: None (Preliminary result)   Collection Time: 04/30/20  5:13 PM   Specimen: BLOOD LEFT FOREARM  Result Value Ref Range Status   Specimen Description BLOOD LEFT FOREARM  Final   Special Requests   Final     BOTTLES DRAWN AEROBIC ONLY Blood Culture adequate volume   Culture   Final    NO GROWTH < 24 HOURS Performed at [facility], [place] [address] [place], [place] [address]    Report Status PENDING  Incomplete    RN Pressure Injury Documentation:     Estimated body mass index is 38.98 kg/m as calculated from the following:   Height as of this encounter: 5\' 8"  (1.727 m).   Weight as of this encounter: 116.3 kg.  Malnutrition Type:      Malnutrition Characteristics:      Nutrition Interventions:    Radiology Studies: No results found. Scheduled Meds: . Chlorhexidine  Gluconate Cloth  6 each Topical Daily  . heparin  5,000 Units Subcutaneous Q8H  . levETIRAcetam  500 mg Oral BID  . lisinopril  20 mg Oral Daily  . nebivolol  5 mg Oral Daily   Continuous Infusions:   LOS: 3 days   Kerney Elbe, DO [facility] PAGER is on [place]  If 7PM-7AM, please contact night-coverage [email or web address]

## 2020-05-01 NOTE — Progress Notes (Signed)
Patient has continuous EEG in progress, however some of the EEG leads are disconnected. Spoke with RN regarding this and CPAP placement. Patient states he does not wear CPAP at home. Will hold CPAP at this time.Patient currently 96% on RA.

## 2020-05-01 NOTE — Progress Notes (Signed)
Subjective: No further seizures overnight.  States the electrodes were itchy and therefore patient accidentally removed some of the electrodes.  Wife at bedside states patient had his first seizure-like episode on 04/27/2020.  He was standing and suddenly started swaying back and forth, not responding.  Wife helped him to sit down and he was noted to have right hand tremor-like episode with his eyes looking upwards not responding.  He would do that for a few seconds followed by coming to eat and talking to his wife and then have the same episode of hand tremoring and eyes rolled up again for few minutes.  Therefore family called EMS and EMS was concerned that patient is having partial seizures.  En route to [facility] per EMS patient had generalized tonic-clonic seizures and then had further seizures at [facility].  Patient and his wife report the patient has been under tremendous stress recently including due to death of his nephew.   ROS: negative except above  Examination  Vital signs in last 24 hours: Temp:  [98.8 F (37.1 C)-100.5 F (38.1 C)] 99.4 F (37.4 C) (10/11 1153) Pulse Rate:  [81-86] 85 (10/11 0412) Resp:  [20-24] 20 (10/11 1153) BP: (151-191)/(78-98) 191/92 (10/11 1153) SpO2:  [94 %-97 %] 94 % (10/11 1153)  General: lying in bed, not in apparent distress CVS: pulse-normal rate and rhythm RS: breathing comfortably, CTA B Extremities: normal, warm Neuro: AOx3, cranial nerves grossly intact, 5/5 in all 4 extremities, sensory intact, FTN intact  Basic Metabolic Panel: Recent Labs  Lab 04/27/20 1428 04/27/20 1428 04/27/20 1818 04/28/20 0656 04/28/20 0656 04/28/20 1126 04/29/20 0511 04/30/20 0537 05/01/20 0052  NA 139  --   --  141  --   --  141 141 140  K 3.9  --   --  3.4*  --   --  3.7 3.5 3.6  CL 102  --   --  104  --   --  105 105 103  CO2 25  --   --  30  --   --  27 28 27   GLUCOSE 114*  --   --  109*  --   --  102* 109* 126*  BUN 20  --   --  14  --   --  10 8 10    CREATININE 1.19   < > 1.02 0.89  --  0.84 1.02 0.89 1.16  CALCIUM 8.6*   < >  --  8.5*   < >  --  8.4* 8.7* 8.7*  MG  --   --  2.1  --   --   --  1.8 2.2 2.2  PHOS  --   --   --   --   --   --  3.0  --  2.9   < > = values in this interval not displayed.    CBC: Recent Labs  Lab 04/27/20 1818 04/28/20 0656 04/28/20 1126 04/29/20 0511 05/01/20 0052  WBC 4.9 5.5 5.6 5.9 8.7  NEUTROABS  --  3.7  --   --  6.0  HGB 12.9* 13.3 14.1 13.6 13.2  HCT 40.1 40.9 45.4 44.3 42.5  MCV 86.8 86.7 87.6 87.9 88.2  PLT 226 222 247 237 230     Coagulation Studies: No results for input(s): LABPROT, INR in the last 72 hours.  Imaging MRI brain with and without contrast 04/27/2020: Small remote left parietal lesion which could be an infarct or focal cortical dysplasia.  Incidental pericallosal lipoma.  ASSESSMENT AND PLAN: 54 year old male who presented with new onset seizure-like episodes.  Convulsions -LTM EEG captured 2 events which were nonepileptic.  No further events captured overnight.  Recommendations - LTM EEG so far is within normal limits.  The description of patient's episode sounds less likely to be epileptic.  However, patient does have left parietal lesion which could be an infarct or focal cortical dysplasia. -Therefore would recommen continuing Keppra 500 mg twice daily  - We will LTM EEG for another 24 hours and if negative will discontinue tomorrow. -Discussed diagnosis of nonepileptic events with patient and wife at bedside.  They acknowledge that patient has been under stress and agree with the diagnosis of nonepileptic events. -Discussed seizure precautions including do not drive -As needed IV Ativan 2 mg for clinical seizures lasting more than 5 minutes  I have spent a total of 35 minutes with the patient reviewing hospital notes,  test results, labs and examining the patient as well as establishing an assessment and plan that was discussed personally with the patient, wife  at bedside and Dr. Alfredia Ferguson.  > 50% of time was spent in direct patient care.    Zeb Comfort Epilepsy [facility] For questions after 5pm please refer to AMION to reach the Neurologist on call

## 2020-05-01 NOTE — Progress Notes (Signed)
All EEG electrodes had been removed by patient - patient has been rehooked

## 2020-05-01 NOTE — Procedures (Signed)
RVI:[id number] Epilepsy Attending:Rhyland Hinderliter Barbra Sarks Referring Physician/Provider:Dr Kerney Elbe Duration:04/30/2020 6147 to 05/01/2020 1223  Patient history:54 y.o. w/ remote left parietal insult, HTN, OSA presenting with status epilepticus requiring emergent [facility] seizure.  Level of alertness: awake, asleep  AEDs during EEG study: LEV  Technical aspects: This EEG study was done with scalp electrodes positioned according to the 10-20 International system of electrode placement. Electrical activity was acquired at a sampling rate of 500Hz  and reviewed with a high frequency filter of 70Hz  and a low frequency filter of 1Hz . EEG data were recorded continuously and digitally stored.   Description:  The posterior dominant rhythm consists of 9-10 Hz activity of moderate voltage (25-35 uV) seen predominantly in posterior head regions, symmetric and reactive to eye opening and eye closing. Sleep was characterized by vertex waves, sleep spindles (12 to 14 Hz), maximal frontocentral region. Hyperventilation and photic stimulation were not performed.   Of note, eeg was difficult to interpret due to significant electrode artifact after 2100 on 04/30/2020.  IMPRESSION: This study is within normal limits. No seizures or epileptiform discharges were seen throughout the recording.  Antoni Stefan Barbra Sarks

## 2020-05-02 LAB — CBC WITH DIFFERENTIAL/PLATELET
Abs Immature Granulocytes: 0.02 10*3/uL (ref 0.00–0.07)
Basophils Absolute: 0.1 10*3/uL (ref 0.0–0.1)
Basophils Relative: 1 %
Eosinophils Absolute: 0.1 10*3/uL (ref 0.0–0.5)
Eosinophils Relative: 1 %
HCT: 41.7 % (ref 39.0–52.0)
Hemoglobin: 13 g/dL (ref 13.0–17.0)
Immature Granulocytes: 0 %
Lymphocytes Relative: 18 %
Lymphs Abs: 1.4 10*3/uL (ref 0.7–4.0)
MCH: 27.5 pg (ref 26.0–34.0)
MCHC: 31.2 g/dL (ref 30.0–36.0)
MCV: 88.2 fL (ref 80.0–100.0)
Monocytes Absolute: 1.1 10*3/uL — ABNORMAL HIGH (ref 0.1–1.0)
Monocytes Relative: 13 %
Neutro Abs: 5.4 10*3/uL (ref 1.7–7.7)
Neutrophils Relative %: 67 %
Platelets: 226 10*3/uL (ref 150–400)
RBC: 4.73 MIL/uL (ref 4.22–5.81)
RDW: 13.1 % (ref 11.5–15.5)
WBC: 8 10*3/uL (ref 4.0–10.5)
nRBC: 0 % (ref 0.0–0.2)

## 2020-05-02 LAB — COMPREHENSIVE METABOLIC PANEL
ALT: 16 U/L (ref 0–44)
AST: 13 U/L — ABNORMAL LOW (ref 15–41)
Albumin: 3.2 g/dL — ABNORMAL LOW (ref 3.5–5.0)
Alkaline Phosphatase: 58 U/L (ref 38–126)
Anion gap: 9 (ref 5–15)
BUN: 12 mg/dL (ref 6–20)
CO2: 27 mmol/L (ref 22–32)
Calcium: 8.8 mg/dL — ABNORMAL LOW (ref 8.9–10.3)
Chloride: 103 mmol/L (ref 98–111)
Creatinine, Ser: 1.14 mg/dL (ref 0.61–1.24)
GFR, Estimated: >60 mL/min (ref >60)
Glucose, Bld: 109 mg/dL — ABNORMAL HIGH (ref 70–99)
Potassium: 3.6 mmol/L (ref 3.5–5.1)
Sodium: 139 mmol/L (ref 135–145)
Total Bilirubin: 0.2 mg/dL — ABNORMAL LOW (ref 0.3–1.2)
Total Protein: 6.8 g/dL (ref 6.5–8.1)

## 2020-05-02 LAB — PHOSPHORUS: Phosphorus: 3.7 mg/dL (ref 2.5–4.6)

## 2020-05-02 LAB — MAGNESIUM: Magnesium: 2.3 mg/dL (ref 1.7–2.4)

## 2020-05-02 MED ORDER — INFLUENZA VAC SPLIT QUAD 0.5 ML IM SUSY
0.5000 mL | PREFILLED_SYRINGE | INTRAMUSCULAR | Status: AC
  Administered 2020-05-02: 0.5 mL via INTRAMUSCULAR
  Filled 2020-05-02: qty 0.5

## 2020-05-02 MED ORDER — ACETAMINOPHEN 325 MG PO TABS: 650.0000 mg | ORAL_TABLET | Freq: Four times a day (QID) | ORAL | 0 refills | Status: DC | PRN

## 2020-05-02 MED ORDER — POLYETHYLENE GLYCOL 3350 17 G PO PACK: 17.0000 g | PACK | Freq: Every day | ORAL | 0 refills | Status: DC | PRN

## 2020-05-02 MED ORDER — HYDRALAZINE HCL 50 MG PO TABS
50.0000 mg | ORAL_TABLET | Freq: Once | ORAL | Status: AC
  Administered 2020-05-02: 50 mg via ORAL
  Filled 2020-05-02: qty 1

## 2020-05-02 MED ORDER — LEVETIRACETAM 500 MG PO TABS
500.0000 mg | ORAL_TABLET | Freq: Two times a day (BID) | ORAL | 0 refills | Status: DC
Start: 2020-05-02 — End: 2020-06-20

## 2020-05-02 NOTE — Plan of Care (Signed)
  Problem: Coping: Goal: Ability to adjust to condition or change in health will improve Outcome: Progressing   Problem: Coping: Goal: Ability to adjust to condition or change in health will improve Outcome: Progressing   Problem: Education: Goal: Expressions of having a comfortable level of knowledge regarding the disease process will increase Outcome: Progressing

## 2020-05-02 NOTE — Progress Notes (Addendum)
Subjective: No further seizure-like episodes of night.  ROS: negative except above  Examination  Vital signs in last 24 hours: Temp:  [98.9 F (37.2 C)-99.6 F (37.6 C)] 98.9 F (37.2 C) (10/12 0829) Pulse Rate:  [80-86] 86 (10/12 0829) Resp:  [18-20] 18 (10/12 0829) BP: (153-191)/(84-96) 155/86 (10/12 0829) SpO2:  [92 %-97 %] 94 % (10/12 0829) Weight:  [115.5 kg] 115.5 kg (10/12 0347)   General: lying in bed, not in apparent distress CVS: pulse-normal rate and rhythm RS: breathing comfortably, CTA B Extremities: normal, warm Neuro: AOx3, cranial nerves grossly intact, 5/5 in all 4 extremities, sensory intact, FTN intact  Basic Metabolic Panel: Recent Labs  Lab 04/27/20 1428 04/27/20 1818 04/28/20 0656 04/28/20 0656 04/28/20 1126 04/29/20 0511 04/29/20 0511 04/30/20 0537 05/01/20 0052 05/02/20 0302  NA   < >  --  141  --   --  141  --  141 140 139  K   < >  --  3.4*  --   --  3.7  --  3.5 3.6 3.6  CL   < >  --  104  --   --  105  --  105 103 103  CO2   < >  --  30  --   --  27  --  28 27 27   GLUCOSE   < >  --  109*  --   --  102*  --  109* 126* 109*  BUN   < >  --  14  --   --  10  --  8 10 12   CREATININE   < > 1.02 0.89   < > 0.84 1.02  --  0.89 1.16 1.14  CALCIUM   < >  --  8.5*   < >  --  8.4*   < > 8.7* 8.7* 8.8*  MG  --  2.1  --   --   --  1.8  --  2.2 2.2 2.3  PHOS  --   --   --   --   --  3.0  --   --  2.9 3.7   < > = values in this interval not displayed.    CBC: Recent Labs  Lab 04/28/20 0656 04/28/20 1126 04/29/20 0511 05/01/20 0052 05/02/20 0302  WBC 5.5 5.6 5.9 8.7 8.0  NEUTROABS 3.7  --   --  6.0 5.4  HGB 13.3 14.1 13.6 13.2 13.0  HCT 40.9 45.4 44.3 42.5 41.7  MCV 86.7 87.6 87.9 88.2 88.2  PLT 222 247 237 230 226     Coagulation Studies: No results for input(s): LABPROT, INR in the last 72 hours.  Imaging No new brain imaging overnight   ASSESSMENT AND PLAN: 54 year old male who presented with new onset seizure-like  episodes.  Convulsions -LTM EEG captured 2 events which were nonepileptic.  -MRI brain was motion degraded but showed incidental small remote left parietal lesion which could be an infarct or focal cortical dysplasia.    Recommendations - LTM EEG for over 72 hours within normal limits.  The description of patient's episode sounds less likely to be epileptic.  However, patient does have left parietal lesion which could be an infarct or focal cortical dysplasia. -Therefore would recommen continuing Keppra 500 mg twice daily  -Again discussed with patient that his EEG looks within normal limits. However patients can have both epileptic and nonepileptic events.  In the presence of lesion on MRI brain, will recommend continuing  Keppra at this point.  Also recommended cognitive behavioral therapy for nonepileptic events.  Discussed seizure precautions including do not drive for 6 months.  . -Recommend follow-up with neurology (patient prefers [facility]) in 12 weeks.  Seizure precautions: Per [facility] statutes, patients with seizures are not allowed to drive until they have been seizure-free for six months and cleared by a physician    Use caution when using heavy equipment or power tools. Avoid working on ladders or at heights. Take showers instead of baths. Ensure the water temperature is not too high on the home water heater. Do not go swimming alone. Do not lock yourself in a room alone (i.e. bathroom). When caring for infants or small children, sit down when holding, feeding, or changing them to minimize risk of injury to the child in the event you have a seizure. Maintain good sleep hygiene. Avoid alcohol.    If patient has another seizure, call 911 and bring them back to the ED if: A.  The seizure lasts longer than 5 minutes.      B.  The patient doesn't wake shortly after the seizure or has new problems such as difficulty seeing, speaking or moving following the  seizure C.  The patient was injured during the seizure D.  The patient has a temperature over 102 F (39C) E.  The patient vomited during the seizure and now is having trouble breathing    During the Seizure   - First, ensure adequate ventilation and place patients on the floor on their left side  Loosen clothing around the neck and ensure the airway is patent. If the patient is clenching the teeth, do not force the mouth open with any object as this can cause severe damage - Remove all items from the surrounding that can be hazardous. The patient may be oblivious to what's happening and may not even know what he or she is doing. If the patient is confused and wandering, either gently guide him/her away and block access to outside areas - Reassure the individual and be comforting - Call 911. In most cases, the seizure ends before EMS arrives. However, there are cases when seizures may last over 3 to 5 minutes. Or the individual may have developed breathing difficulties or severe injuries. If a pregnant patient or a person with diabetes develops a seizure, it is prudent to call an ambulance. - Finally, if the patient does not regain full consciousness, then call EMS. Most patients will remain confused for about 45 to 90 minutes after a seizure, so you must use judgment in calling for help. - Avoid restraints but make sure the patient is in a bed with padded side rails - Place the individual in a lateral position with the neck slightly flexed; this will help the saliva drain from the mouth and prevent the tongue from falling backward - Remove all nearby furniture and other hazards from the area - Provide verbal assurance as the individual is regaining consciousness - Provide the patient with privacy if possible - Call for help and start treatment as ordered by the caregiver    After the Seizure (Postictal Stage)   After a seizure, most patients experience confusion, fatigue, muscle pain and/or a  headache. Thus, one should permit the individual to sleep. For the next few days, reassurance is essential. Being calm and helping reorient the person is also of importance.   Most seizures are painless and end spontaneously. Seizures are not harmful to  others but can lead to complications such as stress on the lungs, brain and the heart. Individuals with prior lung problems may develop labored breathing and respiratory distress.    Non-Epileptic Seizures, Adult A seizure can cause:  Involuntary movements, like falling or shaking.  Changes in awareness or consciousness.  Convulsions. These are episodes of uncontrollable, jerking movement caused by sudden, intense tightening (contraction) of the muscles. Epileptic seizures are caused by abnormal electrical activity in the brain. Non-epileptic seizures are different. They are not caused by abnormal electrical signals in your brain. These seizures may look like epileptic seizures, but they are not caused by epilepsy. There are two types of non-epileptic seizures:  Physiologic non-epileptic seizure. This type results from an underlying problem that causes a disruption in your brain's electrical activity.  Psychogenic non-epileptic seizure. This type results from emotional stress. These seizures are sometimes called pseudoseizures. What are the causes? Causes of physiologic non-epileptic seizures can include:  Sudden drop in blood pressure.  Low blood sugar (glucose).  Low levels of salt (sodium) in your blood.  Low levels of calcium in your blood.  Migraine.  Heart rhythm problems.  Sleep disorders, such as narcolepsy.  Movement disorders, such as Tourette syndrome.  Infection.  Certain medicines.  Drug and alcohol abuse.  Fever. Common causes of psychogenic non-epileptic seizures can include:  Stress.  Emotional trauma.  Sexual or physical abuse.  Major life events, such as divorce or death of a loved one.  Mental  health disorders, including anxiety and depression. What are the signs or symptoms? Symptoms of a non-epileptic seizure can be similar to those of an epileptic seizure, which may include:  A change in attention or behavior (altered mental status).  Loss of consciousness or fainting.  Convulsions with rhythmic jerking movements.  Drooling.  Rapid eye movements.  Grunting.  Loss of bladder control and bowel control.  Bitter taste in the mouth.  Tongue biting. Some people experience unusual sensations (aura) before having a seizure. These can include:  "Butterflies" in the stomach.  Abnormal smells or tastes.  A feeling of having had a new experience before (dj vu). After a non-epileptic seizure, you may have a headache or sore muscles or feel confused and sleepy. Non-epileptic seizures usually:  Do not cause physical injuries.  Start slowly.  Include crying or shrieking.  Last longer than 2 minutes.  Include pelvic thrusting. How is this diagnosed?  Non-epileptic seizures may be diagnosed by:  Your medical history.  A physical exam.  Your symptoms. ? Your health care provider may want to talk with your friends or relatives who have seen you have a seizure. ? If possible, it is helpful if you write down your seizure activity, including what led up to the seizure, and share that information with your health care provider. You may also need to have tests to look for causes of physiologic non-epileptic seizures. These may include:  An electroencephalogram (EEG). This test measures electrical activity in your brain. If you have had a non-epileptic seizure, the results of your EEG will likely be normal.  Video EEG. This test takes place in the hospital over the course of 2-7 days. The test uses a video camera and an EEG to monitor your symptoms and the electrical activity in your brain.  Blood tests.  Lumbar puncture. This test involves pulling fluid from your  spine to check for infection.  Electrocardiogram (ECG or EKG). This test checks for an abnormal heart rhythm.  CT scan.  If your health care provider thinks you have had a psychogenic non-epileptic seizure, you may need to see a mental health specialist for an evaluation. How is this treated? The treatment for your seizures will depend on what is causing them. When the underlying condition is treated, your seizures should stop. If your seizures are being caused by emotional trauma or stress, your health care provider may recommend that you see a mental health professional. Treatment may include:  Relaxation therapy or cognitive behavioral therapy.  Medicines to treat depression or anxiety.  Individual or family counseling. In some cases, you may have psychogenic seizures in addition to epileptic seizures. If this is the case, you may be prescribed medicine to help with the epileptic seizures. Follow these instructions at home:  Home care will depend on the type of non-epileptic seizures that you have. In general:  Follow all instructions from your health care provider. These may include ways to prevent seizures and what to do if you have a seizure.  Take over-the-counter and prescription medicines only as told by your health care provider.  Keep all follow-up visits as told by your health care provider. This is important.  Make sure family members, friends, and coworkers are trained on how to help you if you have a seizure. If you have a seizure, they should: ? Lay you on the ground to prevent a fall. ? Place a pillow or piece of clothing under your head. ? Loosen any clothing around your neck. ? Turn you onto your side. If vomiting occurs, this helps keep your airway clear.  Avoid any substances that may prevent your medicine from working properly. If you are prescribed medicine for seizures: ? Do not use recreational drugs. ? Limit or avoid alcoholic beverages. Contact a health  care provider if:  Your seizures change or become more frequent.  You continue to have seizures after treatment. Get help right away if:  You injure yourself during a seizure.  You have one seizure after another.  You have trouble recovering from a seizure.  You have chest pain or trouble breathing.  You have a seizure that lasts longer than 5 minutes. Summary  Non-epileptic seizures may look like epileptic seizures, but they are not caused by epilepsy.  The treatment for your seizures will depend on what is causing them. When the underlying condition is treated, your seizures should stop.  Make sure family members, friends, and coworkers are trained on how to help you if you have a seizure. If you have a seizure, they should lay you on the ground to prevent a fall, protect your head and neck, and turn you onto your side. This information is not intended to replace advice given to you by your health care provider. Make sure you discuss any questions you have with your health care provider.   Managing Non-Epileptic Seizures, Adult Epileptic seizures are caused by abnormal electrical activity in the brain, but not all seizures are caused by epilepsy. Seizures that are not caused by epilepsy are called non-epileptic seizures, and there are two types:  Physiologic non-epileptic seizure. This is also called provoked seizure or organic seizure. This type of seizure stops when the cause goes away or is treated. Possible causes include: ? High fever. ? High or low blood sugar (glucose). ? Brain injury. ? Brain infection.  Psychogenic non-epileptic seizure (PNES). This can be caused by a mental disturbance (psychological distress), not by abnormal brain activity or brain injury. This may look similar to  other types of seizures. A PNES may be called an "event" or "attack" instead of a seizure. Possible causes include: ? Stress. ? Major life events, such as divorce or death of a loved  one. ? Post-traumatic stress disorder (PTSD). ? Physical or sexual abuse. ? Mental health disorders, including anxiety and depression. General treatment recommendations Physiologic non-epileptic seizure  If you have physiologic non-epileptic seizures, your health care provider will treat the cause. These seizures are not likely to return, and they do not need further treatment. PNES  Your health care provider may suspect PNES if: ? You have seizures that keep coming back (recurring) and do not respond to seizure medicines. ? You do not show any abnormal brain activity during electrical brain activity testing (electroencephalogram, or EEG).  It is very important to understand that PNES is a real illness. It does not mean that the seizure is fake. It just means that the cause is different. PNES can be treated.  You may be referred to a mental health specialist (psychiatrist). This is because PNES is a mental health disorder. Mental health treatment may include: ? Talk therapy (cognitive behavioral therapy, or CBT). This is the most effective treatment. Through CBT, you will learn to identify and manage the psychological distress that leads to seizures. ? Stress reduction and relaxation techniques. ? Family therapy. ? Medicines to treat depression or anxiety.  In many cases, knowing that seizures are not caused by epilepsy reduces or stops seizures. How to manage lifestyle changes Managing stress      Certain types of counseling can be very helpful for managing stress. A mental health professional can assess what other treatments may also help you, such as:  Cognitive behavioral therapy.  Medicine to treat depression or anxiety.  Biofeedback. This uses signals from your body (physiological responses) to help you learn to regulate anxiety.  Meditation.  Yoga. Consider self-care strategies to lower stress levels, such as:  Talking with a trusted friend or family member about  your thoughts and feelings.  Muscle relaxation and breathing exercises.  Trying activities to relieve stress, such as: ? Deep breathing. ? Listening to music. ? Exercising or taking a walk. ? Writing in a journal. ? Company secretary.  Relationships  Make sure family members, friends, and co-workers are trained in how to help you if you have a seizure. If you have a seizure, people near you should:  Lay you on the ground to prevent a fall.  Place a pillow or piece of clothing under your head.  Loosen any tight clothing, especially around your neck.  Turn you onto your side. This helps keep your airway clear if you vomit. Make sure people do not try to hold you down, keep you still, or put anything in your mouth during a seizure. Follow these instructions at home: [place] may be prescribed to treat depression or anxiety that causes non-epileptic seizures. Avoid using alcohol and other substances that may prevent your medicines from working properly (may interact). It is also important to:  Talk with your pharmacist or health care provider about all the medicines that you take, their possible side effects, and what medicines are safe to take together.  Make it your goal to take part in all treatment decisions (shared decision-making). Ask about possible side effects of medicines that your health care provider recommends, and tell him or her how you feel about having those side effects. It is best if shared decision-making with your health care provider is  part of your total treatment plan.  Take over-the-counter and prescription medicines only as told by your health care provider. General instructions  Ask your health care provider if it is safe for you to drive.  Return to your normal activities as told by your health care provider. Ask your health care provider what activities are safe for you.  Keep all follow-up visits as told by your health care providers. This is  important. Where to find support You can get support for managing non-epileptic seizures from support groups, either online or in-person. Your health care provider may be able to recommend a support group in your area. Where to find more information  Epilepsy Foundation: www.epilepsy.com  American Epilepsy Society: www.aesnet.org Contact a health care provider if:  Your seizures change or become more frequent.  You continue to have seizures after treatment. Get help right away if:  You injure yourself during a seizure.  You have: ? One seizure after another. ? Trouble recovering from a seizure. ? Chest pain or trouble breathing. ? A seizure that lasts longer than 5 minutes. These symptoms may represent a serious problem that is an emergency. Do not wait to see if the symptoms will go away. Get medical help right away. Call your local emergency services (911 in the [place] Do not drive yourself to the hospital. Summary  Seizures that are not caused by epilepsy are called non-epileptic seizures. The two types of non-epileptic seizures are physiologic non-epileptic seizure and psychogenic non-epileptic seizure (PNES).  PNES is a treatable mental health disorder that is caused by psychological distress. It is very important to work with your mental health provider to find a treatment that works for you.  Learning to manage stress and anxiety is an important part of PNES treatment.  Make sure family members, friends, and co-workers are trained in how to help you if you have a seizure. If you have a seizure, they should lay you on the ground to prevent a fall, protect your head and neck, and turn you onto your side. This information is not intended to replace advice given to you by your health care provider. Make sure you discuss any questions you have with your health care provider.   I have spent a total of35 minuteswith the patient reviewing hospitalnotes,  test results, labs and  examining the patient as well as establishing an assessment and plan that was discussed personally with the patient and Dr. Alfredia Ferguson.>50% of time was spent in direct patient care.  Zeb Comfort Epilepsy [facility] For questions after 5pm please refer to AMION to reach the Neurologist on call

## 2020-05-02 NOTE — TOC Transition Note (Signed)
Transition of Care [facility]) - CM/SW Discharge Note   Patient Details  Name: Jake Kirby MRN: [id number] Date of Birth: 05-06-1966  [facility] CM/SW Contact:  Carles Collet, RN Phone Number: 05/02/2020, 11:14 AM   Clinical Narrative:   Spoke to patient at bedside. Discussed driving limitations for the next 6 months. He states that he has transport through family. Discussed resources from [facility] for transport assistance. Also discussed grocery delivery services. No other CM needs identified.     Final next level of care: Home/Self Care Barriers to Discharge: No Barriers Identified   Patient Goals and CMS Choice        Discharge Placement                       Discharge Plan and Services                                     Social Determinants of Health (SDOH) Interventions     Readmission Risk Interventions No flowsheet data found.

## 2020-05-02 NOTE — Progress Notes (Signed)
Patient has continuous EEG in progress. CPAP held at this time due to placement of mask/head strap. Patient states he does not wear CPAP at home. Currently on RA. SPO2 95%.

## 2020-05-02 NOTE — Procedures (Addendum)
QPY:[id number] Epilepsy Attending:Naliya Gish Barbra Sarks Referring Physician/Provider:Dr Kerney Elbe Duration:05/01/2020 1245 to 10/12/20210933  Patient [id number]:54 y.o. w/ remote left parietal insult, HTN, OSA presenting with status epilepticus [facility].  Level of alertness:awake, asleep  AEDs during EEG study: LEV  Technical aspects: This EEG study was done with scalp electrodes positioned according to the 10-20 International system of electrode placement. Electrical activity was acquired at a sampling rate of 500Hz  and reviewed with a high frequency filter of 70Hz  and a low frequency filter of 1Hz . EEG data were recorded continuously and digitally stored.   Description:The posterior dominant rhythm consists of 9-10 Hz activity of moderate voltage (25-35 uV) seen predominantly in posterior head regions, symmetric and reactive to eye opening and eye closing.Sleep was characterized by vertex waves, sleep spindles (12 to 14 Hz), maximal frontocentral region. Hyperventilation and photic stimulation were not performed.  IMPRESSION: This study is within normal limits. No seizures or epileptiform discharges were seen throughout the recording.  Jake Kirby Barbra Sarks

## 2020-05-02 NOTE — Progress Notes (Signed)
Pt and his significant other received discharge instructions. They do not have any additional questions about new medication. However, pt reports penile itching and some urgency/frequency since foley removal. He has elevated blood pressure, SBP ober 170s.  Primary nurse Sharlet Salina was notified about present concerns.

## 2020-05-02 NOTE — Discharge Summary (Signed)
Physician Discharge Summary  Jake Kirby YPP:[id number] DOB: 02-08-1966 DOA: 04/28/2020  PCP: Cletis Athens, MD  Admit date: 04/28/2020 Discharge date: 05/02/2020  Admitted From: Home Disposition: Home  Recommendations for Outpatient Follow-up:  1. Follow up with PCP in 1-2 weeks 2. Follow up with Neurology within 12 weeks 3. Please obtain CMP/CBC, Mag, Phos in one week 4. Please follow up on the following pending results:  Home Health: No Equipment/Devices: None  Discharge Condition: Stable CODE STATUS: FULL CODE Diet recommendation: Heart Healthy Diet  Brief/Interim Summary: HPI per Dr. Ralene Muskrat on 04/27/20 Jake Kirby a 54 y.o.malewith medical history significant ofobesity, hypertension, obstructive sleep apnea not on home CPAP, tobacco use who presents to the emergency department for evaluation of new onset seizure. My evaluation patient is lethargic and postictal and unable to provide history. Majority the history is gained by reading the medical record, speaking with ED provider, speaking with patient's wife at bedside. Apparently the patient was in his normal state of health when he noted to his family that he did not quite feel right. The family noticed his arm shaking and he fell to the ground. Initial concern was for syncopal event. EMS was called and the patient was confused and responsive. The patient had a witnessed generalized 1 to 2-minute seizure. Patient was given Versed with good abortive effect on route. He continued to have seizures despite this. Patient has no known history of drug or alcohol use. No recent changes in medications. History limited by postictal state and altered mentation  ED Course:On presentation patient underwent CAT scan demonstrating intracranial lipoma. This is of unclear etiology. Neurology was consulted with recommendations for admission. Patient was loaded with intravenous Keppra 2000 mg and hospital service was  called for admission. Patient's vital signs remained stable during ERcourse  **Interim History Patient was seen in the emergency department at [facility] to continue to have short seizure activity in the ED.  Was ureterectomy persisted recommendations were made to begin broad-spectrum antibiotics and acyclovir and transferred to call for further neurological management and continues EEG.  Patient underwent LP prior to transfer which suggested a traumatic tap.  He was transferred to [facility] on 04/28/2020 and because of concern for possible airway compromise was transferred to the ICU and then subsequently transferred out to the stepdown unit.  Neurology is following closely and overnight he had 2 seizure-like episodes neurology recommending resuming LTM.  Currently he is on the [facility] service as of 04/30/2020  05/01/2020: Patient spiked a temperature yesterday of 100.5 and blood cultures were obtained and he was given acetaminophen.  He had overnight EEG monitoring showed no more events overnight and the events on Saturday were nonepileptic.  Neurology recommending continuing LTM EEG monitoring again overnight and discontinue tomorrow just to be sure given the MRI finding.  Patient continued to complain of back pain.  05/02/2020: Overnight EEG was within normal Limits. Neurology deemed patient stable for D/C. BP was slightly elevated prior to D/C but improved. He is to follow up with PCP and Neurology in 1-2 weeks  Discharge Diagnoses:  Active Problems:   Seizure ([place])  New onset seizure with concern for status epilepticus rule out pseudoseizure Intracranial lipoma Questionable Meningitis -Patient was noted to have arm shaking activity followed by generalization of seizure at home -Multiple witnessed tonic-clonic episodes by EMS -Abortive therapy with Ativan was successful -Loaded with Keppra in ED -MRI done and showed "Motion degraded exam. No acute intracranial abnormality. Small remote left  parietal  infarct with underlying mild chronic microvascular ischemic disease. Incidental pericallosal lipoma." -Neurology consulted from ED and appreciate their recommendations Unclear whether intracranial lipoma has any significance to current presentation -LP demonstrates a bloody tap with WBC of 28 concerning for meningitis.WBC is inappropriately elevated given the high RBCs.Mildly elevated protein with high-end of normal glucose.  -Patient was started onmeningitis coverage with ampicillin, ceftriaxone, acyclovir, vancomycin, however will discuss discontinuingat this point and defer to Neuro. - Following CSF cultures; so far, NGTD. HSV and Varicella serology pending as well. - Patient loaded with Keppra, Phenytoin and Vimpat.  Now he is just on Keppra 500 twice daily - Ordered additional 500 mg of Keppra, increased scheduled Keppra to 1500 mg BIDyesterday morning after occurrence of seizure-likeactivity; The EEG was evaluated and showed non-epileptiform activity. Given this, will go ahead and de-escalate back to original dose of Keppra (1000 mg BID) and follow up Neurology's final recommendations. - Question if HTN as etiology of initial seizures on admission. At this time, etiology of non-epileptic seizure-like activity is undetermined. May be stress response. -The night before last he had 2 more seizure-like episodes and neurology was on closely and recommended continuous LTM EEG monitoring; these episodes were presumed to be nonepileptic -Levetiracetam 500 mg p.o. twice daily will be continued and neurology recommends continuing LTM EEG monitoring tonight and since it was normal overnight neurology discontinued this and recommending discharging home and give the patient seizure precautions -They are recommending continuing Keppra 500 mg twice daily at discharge -She was instructed that he cannot drive to the [place] law for at least 6 months until he is seizure-free for that. And he  will need to follow-up with neurology in the outpatient setting within 12 weeks -Seizure recommendations per Dr. Hortense Ramal  Hypertension, poorly controlled -Recently started on oral antihypertensives 1 month prior to admission due to SBP 160-190 with associated headache and blurry vision  -Home medications includeLisinopril and Nebivolol and will resumed -Blood pressure stable overnight without intervention -Continue with PRN Hydralazine 10 mg q4hprn SBP >170 -Restart home medications if BP increases -BP has been elevated and was 173/97 but given PRN hydralazine -Continue to Follow up with PCP for tighter BP control   Hx of OSA not on CPAP at baseline -Monitor respiratory effort and drive closely, given multiple AED in the setting of OSA hypercapnia may develop - CPAP ordered for night-time however patient refused  Tobacco Use -Smoking cessation counseling given -Discussed with the patient about nicotine replacement and nicotine patch  Prediabetes -HbA1c is now 5.8 -CBG's ranging from 102-126 -Patient to monitor blood sugars per protocol and will monitor carefully and if necessary and sensor NovoLog sign scale insulin AC  Obesity -Complicates overall prognosis and care -Estimated body mass index is 38.72 kg/m as calculated from the following:   Height as of this encounter: 5\' 8"  (1.727 m).   Weight as of this encounter: 115.5 kg. -Weight Loss and Dietary Counseling given  Back pain, improving -In the setting of his seizure-like events -Started oxycodone IR 5 mg every 6 hours as needed for moderate pain -Up and out of bed and to the chair once his EEG monitoring has been done -Follow up with PCP and if persists recommend outpatient Imaging   Fever, improved  -Patient had a fever of 100.5 the day before yesterday of unclear etiology and seems to have been improved now; appears to been an isolated event -Obtain blood cultures x2 and this showed no growth to date less  than 24  hours -Continue to monitor fever and temperature curve and currently has no leukocytosis as WBC is gone from 5.9 -> 8.7 -> 8.0 -UA on Admission was Negative  -Continue monitor and trend carefully as an outpatient   Discharge Instructions  Discharge Instructions    Call MD for:  difficulty breathing, headache or visual disturbances   Complete by: As directed    Call MD for:  extreme fatigue   Complete by: As directed    Call MD for:  hives   Complete by: As directed    Call MD for:  persistant dizziness or light-headedness   Complete by: As directed    Call MD for:  persistant nausea and vomiting   Complete by: As directed    Call MD for:  redness, tenderness, or signs of infection (pain, swelling, redness, odor or green/yellow discharge around incision site)   Complete by: As directed    Call MD for:  severe uncontrolled pain   Complete by: As directed    Call MD for:  temperature >100.4   Complete by: As directed    Diet - low sodium heart healthy   Complete by: As directed    Discharge instructions   Complete by: As directed    You were cared for by a hospitalist during your hospital stay. If you have any questions about your discharge medications or the care you received while you were in the hospital after you are discharged, you can call the unit and ask to speak with the hospitalist on call if the hospitalist that took care of you is not available. Once you are discharged, your primary care physician will handle any further medical issues. Please note that NO REFILLS for any discharge medications will be authorized once you are discharged, as it is imperative that you return to your primary care physician (or establish a relationship with a primary care physician if you do not have one) for your aftercare needs so that they can reassess your need for medications and monitor your lab values.  Follow up with PCP within 1 week and Neurology within 12 weeks. Take all  medications as prescribed. If symptoms change or worsen please return to the ED for evaluation   Driving Restrictions   Complete by: As directed    No Driving for 6 months per [place] Law   Increase activity slowly   Complete by: As directed    Other Restrictions   Complete by: As directed    Do not Operate Heavy machinery, Take showers instead of baths     Allergies as of 05/02/2020   No Known Allergies     Medication List    STOP taking these medications   lacosamide 200 mg in sodium chloride 0.9 % 25 mL   levETIRAcetam 1000 MG/100ML Soln Commonly known as: KEPPRA   phenytoin 300 mg in sodium chloride 0.9 % 100 mL     TAKE these medications   acetaminophen 325 MG tablet Commonly known as: TYLENOL Take 2 tablets (650 mg total) by mouth every 6 (six) hours as needed for mild pain, fever or headache.   levETIRAcetam 500 MG tablet Commonly known as: KEPPRA Take 1 tablet (500 mg total) by mouth 2 (two) times daily.   lisinopril 20 MG tablet Commonly known as: ZESTRIL Take 20 mg by mouth daily.   nebivolol 5 MG tablet Commonly known as: BYSTOLIC Take 5 mg by mouth daily.   polyethylene glycol 17 g packet Commonly known as: MIRALAX /  GLYCOLAX Take 17 g by mouth daily as needed for moderate constipation.       No Known Allergies  Consultations:  Neurology  Procedures/Studies: CT Head Wo Contrast  Result Date: 04/27/2020 CLINICAL DATA:  Seizure EXAM: CT HEAD WITHOUT CONTRAST TECHNIQUE: Contiguous axial images were obtained from the base of the skull through the vertex without intravenous contrast. COMPARISON:  None. FINDINGS: Brain: There is no acute intracranial hemorrhage, mass effect, or edema. No acute appearing loss of gray-white differentiation. There is a small area of encephalomalacia involving the left parietal lobe. There is no extra-axial fluid collection. A lipoma is noted along the corpus callosum. Ventricles and sulci are within normal limits in size and  configuration. Vascular: No hyperdense vessel. There is intracranial atherosclerotic calcification at the skull base. Skull: Calvarium is unremarkable. Sinuses/Orbits: No acute finding. Other: None. IMPRESSION: No acute intracranial hemorrhage, mass effect, or evidence of acute infarction. Small area of left parietal encephalomalacia. Electronically Signed   By: Macy Mis M.D.   On: 04/27/2020 15:08   MR BRAIN W WO CONTRAST  Addendum Date: 04/28/2020   ADDENDUM REPORT: 04/28/2020 22:19 ADDENDUM: In addition to the initially described findings, the initially described left parietal defect could also potentially reflect a small focal cortical dysplasia rather than a remote ischemic insult. Electronically Signed   By: Jeannine Boga M.D.   On: 04/28/2020 22:19   Result Date: 04/28/2020 CLINICAL DATA:  Initial evaluation for acute seizure. EXAM: MRI HEAD WITHOUT AND WITH CONTRAST TECHNIQUE: Multiplanar, multiecho pulse sequences of the brain and surrounding structures were obtained without and with intravenous contrast. CONTRAST:  73mL GADAVIST GADOBUTROL 1 MMOL/ML IV SOLN COMPARISON:  Comparison made with prior CT from earlier same day. FINDINGS: Brain: Examination moderately to severely degraded by motion artifact. Cerebral volume within normal limits for age. Mild scattered T2/FLAIR hyperintensity noted within the periventricular and deep white matter both cerebral hemispheres, nonspecific, but most like related chronic microvascular ischemic disease. Small focus of cortical encephalomalacia noted at the left parietal lobe, likely related to a remote ischemic infarct. No abnormal foci of restricted diffusion to suggest acute or subacute ischemia or changes related to seizure. Gray-white matter differentiation otherwise maintained. No other areas of chronic cortical infarction. No definite evidence for acute or chronic intracranial hemorrhage on this motion degraded exam. No appreciable mass lesion,  mass effect or midline shift. No hydrocephalus or extra-axial fluid collection. Pituitary gland suprasellar region within normal limits. Midline structures intact. Pericallosal lipoma noted. No appreciable intrinsic temporal lobe abnormality. No appreciable abnormal enhancement on this motion degraded exam. Vascular: Major intracranial vascular flow voids are maintained. Skull and upper cervical spine: Craniocervical junction within normal limits. Bone marrow signal intensity normal. No scalp soft tissue abnormality. Sinuses/Orbits: Globes and orbital soft tissues demonstrate no acute finding. Paranasal sinuses are largely clear. No significant mastoid effusion. Inner ear structures grossly normal. Other: None. IMPRESSION: 1. Motion degraded exam. 2. No acute intracranial abnormality. 3. Small remote left parietal infarct with underlying mild chronic microvascular ischemic disease. 4. Incidental pericallosal lipoma. Electronically Signed: By: Jeannine Boga M.D. On: 04/28/2020 01:00   DG Chest Port 1 View  Result Date: 04/27/2020 CLINICAL DATA:  Seizure EXAM: PORTABLE CHEST 1 VIEW COMPARISON:  None. FINDINGS: Heart size is mildly enlarged. No focal airspace consolidation, pleural effusion, or pneumothorax. Osseous structures within normal limits. IMPRESSION: Mild cardiomegaly. No active pulmonary disease. Electronically Signed   By: Davina Poke D.O.   On: 04/27/2020 14:42   EEG adult  Result Date: 04/28/2020 Lora Havens, MD     04/28/2020  9:31 PM Patient Name: CHRISTIAN BORGERDING MRN: [id number] Epilepsy Attending: Lora Havens Referring Physician/Provider: Dr Kerney Elbe Date: 04/28/2020 Duration: 24.07 mins Patient history: 54 y.o. w/ remote left parietal insult, HTN, OSA presenting with status epilepticus requiring emergent transfer to [facility] for EEG to evaluate seizure. Level of alertness:asleep/sedated AEDs during EEG study: Ativan, PHT, LEV, LCM Technical aspects: This EEG study  was done with scalp electrodes positioned according to the 10-20 International system of electrode placement. Electrical activity was acquired at a sampling rate of 500Hz  and reviewed with a high frequency filter of 70Hz  and a low frequency filter of 1Hz . EEG data were recorded continuously and digitally stored. Description:  Sleep was characterized by vertex waves, sleep spindles (12 to 14 Hz), maximal frontocentral region.  There is an excessive amount of 15 to 18 Hz  beta activity distributed symmetrically and diffusely.  Hyperventilation and photic stimulation were not performed.   ABNORMALITY -Excessive beta, generalized IMPRESSION: This study is within normal limits. The excessive beta activity seen in the background is most likely due to the effect of benzodiazepine and is a benign EEG pattern. No seizures or epileptiform discharges were seen throughout the recording. Priyanka Barbra Sarks   Overnight EEG with video  Result Date: 05/01/2020 Lora Havens, MD     05/02/2020  8:55 AM [id number]:[id number] Epilepsy Attending:Priyanka Barbra Sarks Referring Physician/Provider:Dr Kerney Elbe Duration:04/30/2020 6387 to 05/01/2020 1223  Patient history:54 y.o. w/ remote left parietal insult, HTN, OSA presenting with status epilepticus requiring emergent [facility] seizure.  Level of alertness: awake, asleep  AEDs during EEG study: LEV  Technical aspects: This EEG study was done with scalp electrodes positioned according to the 10-20 International system of electrode placement. Electrical activity was acquired at a sampling rate of 500Hz  and reviewed with a high frequency filter of 70Hz  and a low frequency filter of 1Hz . EEG data were recorded continuously and digitally stored.  Description:  The posterior dominant rhythm consists of 9-10 Hz activity of moderate voltage (25-35 uV) seen predominantly in posterior head regions, symmetric and reactive to eye opening and eye  closing. Sleep was characterized by vertex waves, sleep spindles (12 to 14 Hz), maximal frontocentral region. Hyperventilation and photic stimulation were not performed.  Of note, eeg was difficult to interpret due to significant electrode artifact after 2100 on 04/30/2020.  IMPRESSION: This study is within normal limits. No seizures or epileptiform discharges were seen throughout the recording. Priyanka Barbra Sarks   Overnight EEG with video  Result Date: 04/29/2020 Lora Havens, MD     04/29/2020 12:19 PM Patient Name: Jake Kirby MRN: [id number] Epilepsy Attending: Lora Havens Referring Physician/Provider: Dr Kerney Elbe Duration: 04/28/2020 2113 to 04/29/2020 1157  Patient history: 54 y.o. w/ remote left parietal insult, HTN, OSA presenting with status epilepticus requiring emergent transfer to [facility] for EEG to evaluate seizure.  Level of alertness: awake, asleep  AEDs during EEG study: PHT, LEV, LCM  Technical aspects: This EEG study was done with scalp electrodes positioned according to the 10-20 International system of electrode placement. Electrical activity was acquired at a sampling rate of 500Hz  and reviewed with a high frequency filter of 70Hz  and a low frequency filter of 1Hz . EEG data were recorded continuously and digitally stored.  Description:  The posterior dominant rhythm consists of 9-10 Hz activity of moderate voltage (25-35 uV) seen  predominantly in posterior head regions, symmetric and reactive to eye opening and eye closing. Sleep was characterized by vertex waves, sleep spindles (12 to 14 Hz), maximal frontocentral region.  Hyperventilation and photic stimulation were not performed.   Event button was pressed on 04/29/2020 at 0808 and at 1021 On video, patient was noted to have whole body non rhythmic asynchronous jerking movement. Concomitant eeg before, during and after the event didn't show any eeg change to suggest seizure. IMPRESSION: This study is within normal  limits. No seizures or epileptiform discharges were seen throughout the recording. Two events were recorded on 04/29/2020 as described above without concomitant eeg change and was not epileptic. [place]   DG FLUORO GUIDED LOC OF NEEDLE/CATH TIP FOR SPINAL INJECT LT  Result Date: 04/28/2020 CLINICAL DATA:  Altered mental status. EXAM: DIAGNOSTIC LUMBAR PUNCTURE UNDER FLUOROSCOPIC GUIDANCE FLUOROSCOPY TIME:  Fluoroscopy Time:  0 minutes 7 seconds. Radiation Exposure Index (if provided by the fluoroscopic device): 19.2 mGy PROCEDURE: After discussing the risks and benefits of this procedure with the patient and patient's wife informed consent was obtained. Back was sterilely prepped and draped. Following local anesthesia with 1% lidocaine a 22 gauge spinal needle was advanced into the L5-S1 space and 10 cc of CSF obtained and sent order labs. The CSF was slightly blood-tinged. IMPRESSION: Successful fluoroscopically directed lumbar puncture. 10 cc of CSF obtained and sent to ordered labs. CSF was slightly blood-tinged. Electronically Signed   By: Marcello Moores  Register   On: 04/28/2020 09:43     Subjective: And examined at bedside he is doing well. Still has some mild back pain but states that the bed is uncomfortable. No nausea or vomiting. EEG showed no seizures and he is cleared by neurology and ready to go home. No other concerns or complaints at this time and understands agrees with the plan of care.  Discharge Exam: Vitals:   05/02/20 1217 05/02/20 1641  BP: (!) 162/98 (!) 173/97  Pulse: 76 69  Resp: 18 18  Temp: 98.8 F (37.1 C) 99.1 F (37.3 C)  SpO2: 96% 97%   Vitals:   05/02/20 0347 05/02/20 0829 05/02/20 1217 05/02/20 1641  BP: (!) 159/96 (!) 155/86 (!) 162/98 (!) 173/97  Pulse: 80 86 76 69  Resp: 18 18 18 18   Temp: 99.2 F (37.3 C) 98.9 F (37.2 C) 98.8 F (37.1 C) 99.1 F (37.3 C)  TempSrc: Oral Oral Oral Oral  SpO2: 94% 94% 96% 97%  Weight: 115.5 kg     Height:        General: Pt is alert, awake, not in acute distress Cardiovascular: RRR, S1/S2 +, no rubs, no gallops Respiratory: Diminished bilaterally, no wheezing, no rhonchi; Unlabored breathing Abdominal: Soft, NT, Distended 2/2 to body habitus, bowel sounds + Extremities: no edema, no cyanosis  The results of significant diagnostics from this hospitalization (including imaging, microbiology, ancillary and laboratory) are listed below for reference.    Microbiology: Recent Results (from the past 240 hour(s))  Respiratory Panel by RT PCR (Flu A&B, Covid) - Nasopharyngeal Swab     Status: None   Collection Time: 04/27/20  2:28 PM   Specimen: Nasopharyngeal Swab  Result Value Ref Range Status   SARS Coronavirus 2 by RT PCR NEGATIVE NEGATIVE Final    Comment: (NOTE) SARS-CoV-2 target nucleic acids are NOT DETECTED.  The SARS-CoV-2 RNA is generally detectable in upper respiratoy specimens during the acute phase of infection. The lowest concentration of SARS-CoV-2 viral copies this assay can  detect is 131 copies/mL. A negative result does not preclude SARS-Cov-2 infection and should not be used as the sole basis for treatment or other patient management decisions. A negative result may occur with  improper specimen collection/handling, submission of specimen other than nasopharyngeal swab, presence of viral mutation(s) within the areas targeted by this assay, and inadequate number of viral copies (<131 copies/mL). A negative result must be combined with clinical observations, patient history, and epidemiological information. The expected result is Negative.  Fact Sheet for Patients:  PinkCheek.be  Fact Sheet for Healthcare Providers:  GravelBags.it  This test is no t yet approved or cleared by the [place] FDA and  has been authorized for detection and/or diagnosis of SARS-CoV-2 by FDA under an Emergency Use Authorization (EUA).  This EUA will remain  in effect (meaning this test can be used) for the duration of the COVID-19 declaration under Section 564(b)(1) of the Act, 21 U.S.C. section 360bbb-3(b)(1), unless the authorization is terminated or revoked sooner.     Influenza A by PCR NEGATIVE NEGATIVE Final   Influenza B by PCR NEGATIVE NEGATIVE Final    Comment: (NOTE) The Xpert Xpress SARS-CoV-2/FLU/RSV assay is intended as an aid in  the diagnosis of influenza from Nasopharyngeal swab specimens and  should not be used as a sole basis for treatment. Nasal washings and  aspirates are unacceptable for Xpert Xpress SARS-CoV-2/FLU/RSV  testing.  Fact Sheet for Patients: PinkCheek.be  Fact Sheet for Healthcare Providers: GravelBags.it  This test is not yet approved or cleared by the [place] FDA and  has been authorized for detection and/or diagnosis of SARS-CoV-2 by  FDA under an Emergency Use Authorization (EUA). This EUA will remain  in effect (meaning this test can be used) for the duration of the  Covid-19 declaration under Section 564(b)(1) of the Act, 21  U.S.C. section 360bbb-3(b)(1), unless the authorization is  terminated or revoked. Performed at [facility], [address] [place], [place] [address]   CSF culture     Status: None   Collection Time: 04/28/20  7:42 AM   Specimen: CSF; Cerebrospinal Fluid  Result Value Ref Range Status   Specimen Description   Final    CSF Performed at [facility], [address], [place], [place] [address]    Special Requests   Final    NONE Performed at [facility], [address] [place], [place] [address]    Gram Stain   Final    NO ORGANISMS SEEN WBC SEEN RED BLOOD CELLS PRESENT Performed at [facility], [address], [place], [place] [address]    Culture   Final    NO GROWTH 3 DAYS Performed at [facility], [place] [address]  [place], [place] [address]    Report Status 05/01/2020 FINAL  Final  Culture, blood (routine x 2)     Status: None (Preliminary result)   Collection Time: 04/28/20  7:43 AM   Specimen: BLOOD  Result Value Ref Range Status   Specimen Description BLOOD LEFT ASSIST CONTROL  Final   Special Requests   Final    BOTTLES DRAWN AEROBIC AND ANAEROBIC Blood Culture results may not be optimal due to an inadequate volume of blood received in culture bottles   Culture   Final    NO GROWTH 4 DAYS Performed at [facility], [address], [place], [place] [address]    Report Status PENDING  Incomplete  Culture, blood (routine x 2)  Status: None (Preliminary result)   Collection Time: 04/28/20  7:43 AM   Specimen: BLOOD  Result Value Ref Range Status   Specimen Description BLOOD RIGHT HAND  Final   Special Requests   Final    BOTTLES DRAWN AEROBIC AND ANAEROBIC Blood Culture results may not be optimal due to an inadequate volume of blood received in culture bottles   Culture   Final    NO GROWTH 4 DAYS Performed at [facility], [address], [place], [place] [address]    Report Status PENDING  Incomplete  MRSA PCR Screening     Status: None   Collection Time: 04/28/20  7:14 PM   Specimen: Nasal Mucosa; Nasopharyngeal  Result Value Ref Range Status   MRSA by PCR NEGATIVE NEGATIVE Final    Comment:        The GeneXpert MRSA Assay (FDA approved for NASAL specimens only), is one component of a comprehensive MRSA colonization surveillance program. It is not intended to diagnose MRSA infection nor to guide or monitor treatment for MRSA infections. Performed at [facility], [place] [address] [place], [place] [address]   Culture, blood (routine x 2)     Status: None (Preliminary result)   Collection Time: 04/30/20  5:12 PM   Specimen: BLOOD LEFT HAND  Result Value Ref Range Status   Specimen Description BLOOD LEFT HAND  Final   Special Requests   Final    BOTTLES  DRAWN AEROBIC ONLY Blood Culture results may not be optimal due to an inadequate volume of blood received in culture bottles   Culture   Final    NO GROWTH 2 DAYS Performed at [facility], [place] [address], [place], [place] [address]    Report Status PENDING  Incomplete  Culture, blood (routine x 2)     Status: None (Preliminary result)   Collection Time: 04/30/20  5:13 PM   Specimen: BLOOD LEFT FOREARM  Result Value Ref Range Status   Specimen Description BLOOD LEFT FOREARM  Final   Special Requests   Final    BOTTLES DRAWN AEROBIC ONLY Blood Culture adequate volume   Culture   Final    NO GROWTH 2 DAYS Performed at [facility], [place] [address], [place], [place] [address]    Report Status PENDING  Incomplete    Labs: BNP (last 3 results) No results for input(s): BNP in the last 8760 hours. Basic Metabolic Panel: Recent Labs  Lab 04/27/20 1428 04/27/20 1818 04/28/20 0656 04/28/20 0656 04/28/20 1126 04/29/20 0511 04/30/20 0537 05/01/20 0052 05/02/20 0302  NA   < >  --  141  --   --  141 141 140 139  K   < >  --  3.4*  --   --  3.7 3.5 3.6 3.6  CL   < >  --  104  --   --  105 105 103 103  CO2   < >  --  30  --   --  27 28 27 27   GLUCOSE   < >  --  109*  --   --  102* 109* 126* 109*  BUN   < >  --  14  --   --  10 8 10 12   CREATININE   < > 1.02 0.89   < > 0.84 1.02 0.89 1.16 1.14  CALCIUM   < >  --  8.5*  --   --  8.4* 8.7*  8.7* 8.8*  MG  --  2.1  --   --   --  1.8 2.2 2.2 2.3  PHOS  --   --   --   --   --  3.0  --  2.9 3.7   < > = values in this interval not displayed.   Liver Function Tests: Recent Labs  Lab 04/27/20 1428 04/28/20 0656 05/01/20 0052 05/02/20 0302  AST 23 16 13* 13*  ALT 16 15 14 16   ALKPHOS 59 50 63 58  BILITOT 0.9 0.6 0.5 0.2*  PROT 6.7 6.9 6.9 6.8  ALBUMIN 3.7 3.7 3.4* 3.2*   No results for input(s): LIPASE, AMYLASE in the last 168 hours. No results for input(s): AMMONIA in the last 168 hours. CBC: Recent Labs  Lab  04/28/20 0656 04/28/20 1126 04/29/20 0511 05/01/20 0052 05/02/20 0302  WBC 5.5 5.6 5.9 8.7 8.0  NEUTROABS 3.7  --   --  6.0 5.4  HGB 13.3 14.1 13.6 13.2 13.0  HCT 40.9 45.4 44.3 42.5 41.7  MCV 86.7 87.6 87.9 88.2 88.2  PLT 222 247 237 230 226   Cardiac Enzymes: No results for input(s): CKTOTAL, CKMB, CKMBINDEX, TROPONINI in the last 168 hours. BNP: Invalid input(s): POCBNP CBG: Recent Labs  Lab 04/27/20 2035 04/28/20 1111 04/29/20 2017  GLUCAP 110* 101* 116*   D-Dimer No results for input(s): DDIMER in the last 72 hours. Hgb A1c No results for input(s): HGBA1C in the last 72 hours. Lipid Profile No results for input(s): CHOL, HDL, LDLCALC, TRIG, CHOLHDL, LDLDIRECT in the last 72 hours. Thyroid function studies No results for input(s): TSH, T4TOTAL, T3FREE, THYROIDAB in the last 72 hours.  Invalid input(s): FREET3 Anemia work up No results for input(s): VITAMINB12, FOLATE, FERRITIN, TIBC, IRON, RETICCTPCT in the last 72 hours. Urinalysis    Component Value Date/Time   COLORURINE STRAW (A) 04/28/2020 0411   APPEARANCEUR CLEAR (A) 04/28/2020 0411   LABSPEC 1.009 04/28/2020 0411   PHURINE 8.0 04/28/2020 0411   GLUCOSEU NEGATIVE 04/28/2020 0411   HGBUR NEGATIVE 04/28/2020 0411   BILIRUBINUR NEGATIVE 04/28/2020 0411   KETONESUR NEGATIVE 04/28/2020 0411   PROTEINUR NEGATIVE 04/28/2020 0411   NITRITE NEGATIVE 04/28/2020 0411   LEUKOCYTESUR NEGATIVE 04/28/2020 0411   Sepsis Labs Invalid input(s): PROCALCITONIN,  WBC,  LACTICIDVEN Microbiology Recent Results (from the past 240 hour(s))  Respiratory Panel by RT PCR (Flu A&B, Covid) - Nasopharyngeal Swab     Status: None   Collection Time: 04/27/20  2:28 PM   Specimen: Nasopharyngeal Swab  Result Value Ref Range Status   SARS Coronavirus 2 by RT PCR NEGATIVE NEGATIVE Final    Comment: (NOTE) SARS-CoV-2 target nucleic acids are NOT DETECTED.  The SARS-CoV-2 RNA is generally detectable in upper respiratoy specimens  during the acute phase of infection. The lowest concentration of SARS-CoV-2 viral copies this assay can detect is 131 copies/mL. A negative result does not preclude SARS-Cov-2 infection and should not be used as the sole basis for treatment or other patient management decisions. A negative result may occur with  improper specimen collection/handling, submission of specimen other than nasopharyngeal swab, presence of viral mutation(s) within the areas targeted by this assay, and inadequate number of viral copies (<131 copies/mL). A negative result must be combined with clinical observations, patient history, and epidemiological information. The expected result is Negative.  Fact Sheet for Patients:  PinkCheek.be  Fact Sheet for Healthcare Providers:  GravelBags.it  This test is no t yet approved or cleared  by the [place] and  has been authorized for detection and/or diagnosis of SARS-CoV-2 by FDA under an Emergency Use Authorization (EUA). This EUA will remain  in effect (meaning this test can be used) for the duration of the COVID-19 declaration under Section 564(b)(1) of the Act, 21 U.S.C. section 360bbb-3(b)(1), unless the authorization is terminated or revoked sooner.     Influenza A by PCR NEGATIVE NEGATIVE Final   Influenza B by PCR NEGATIVE NEGATIVE Final    Comment: (NOTE) The Xpert Xpress SARS-CoV-2/FLU/RSV assay is intended as an aid in  the diagnosis of influenza from Nasopharyngeal swab specimens and  should not be used as a sole basis for treatment. Nasal washings and  aspirates are unacceptable for Xpert Xpress SARS-CoV-2/FLU/RSV  testing.  Fact Sheet for Patients: PinkCheek.be  Fact Sheet for Healthcare Providers: GravelBags.it  This test is not yet approved or cleared by the [place] FDA and  has been authorized for detection and/or  diagnosis of SARS-CoV-2 by  FDA under an Emergency Use Authorization (EUA). This EUA will remain  in effect (meaning this test can be used) for the duration of the  Covid-19 declaration under Section 564(b)(1) of the Act, 21  U.S.C. section 360bbb-3(b)(1), unless the authorization is  terminated or revoked. Performed at [facility], [address] [place], [place] [address]   CSF culture     Status: None   Collection Time: 04/28/20  7:42 AM   Specimen: CSF; Cerebrospinal Fluid  Result Value Ref Range Status   Specimen Description   Final    CSF Performed at [facility], [address] [place], [place] [address]    Special Requests   Final    NONE Performed at [facility], [address] [place], [place] [address]    Gram Stain   Final    NO ORGANISMS SEEN WBC SEEN RED BLOOD CELLS PRESENT Performed at [facility], [address], [place], [place] [address]    Culture   Final    NO GROWTH 3 DAYS Performed at [facility], [place] [address] [place], [place] [address]    Report Status 05/01/2020 FINAL  Final  Culture, blood (routine x 2)     Status: None (Preliminary result)   Collection Time: 04/28/20  7:43 AM   Specimen: BLOOD  Result Value Ref Range Status   Specimen Description BLOOD LEFT ASSIST CONTROL  Final   Special Requests   Final    BOTTLES DRAWN AEROBIC AND ANAEROBIC Blood Culture results may not be optimal due to an inadequate volume of blood received in culture bottles   Culture   Final    NO GROWTH 4 DAYS Performed at [facility], [address] [place], [place] [address]    Report Status PENDING  Incomplete  Culture, blood (routine x 2)     Status: None (Preliminary result)   Collection Time: 04/28/20  7:43 AM   Specimen: BLOOD  Result Value Ref Range Status   Specimen Description BLOOD RIGHT HAND  Final   Special Requests   Final    BOTTLES DRAWN AEROBIC AND ANAEROBIC Blood Culture results  may not be optimal due to an inadequate volume of blood received in culture bottles   Culture   Final    NO GROWTH 4 DAYS Performed at [facility], [address] [place] [address], [place],  [address]    Report Status PENDING  Incomplete  MRSA PCR Screening  Status: None   Collection Time: 04/28/20  7:14 PM   Specimen: Nasal Mucosa; Nasopharyngeal  Result Value Ref Range Status   MRSA by PCR NEGATIVE NEGATIVE Final    Comment:        The GeneXpert MRSA Assay (FDA approved for NASAL specimens only), is one component of a comprehensive MRSA colonization surveillance program. It is not intended to diagnose MRSA infection nor to guide or monitor treatment for MRSA infections. Performed at [facility], [place] [address] [place], [place] [address]   Culture, blood (routine x 2)     Status: None (Preliminary result)   Collection Time: 04/30/20  5:12 PM   Specimen: BLOOD LEFT HAND  Result Value Ref Range Status   Specimen Description BLOOD LEFT HAND  Final   Special Requests   Final    BOTTLES DRAWN AEROBIC ONLY Blood Culture results may not be optimal due to an inadequate volume of blood received in culture bottles   Culture   Final    NO GROWTH 2 DAYS Performed at [facility], [place] [address], [place],  [address]    Report Status PENDING  Incomplete  Culture, blood (routine x 2)     Status: None (Preliminary result)   Collection Time: 04/30/20  5:13 PM   Specimen: BLOOD LEFT FOREARM  Result Value Ref Range Status   Specimen Description BLOOD LEFT FOREARM  Final   Special Requests   Final    BOTTLES DRAWN AEROBIC ONLY Blood Culture adequate volume   Culture   Final    NO GROWTH 2 DAYS Performed at [facility], [place] [address] [place],  [address]    Report Status PENDING  Incomplete   Time coordinating discharge: 35 minutes  SIGNED:  Kerney Elbe, DO [facility] 05/02/2020, 8:35 PM Pager is on [place]  If 7PM-7AM, please  contact night-coverage [email or web address]

## 2020-05-03 LAB — CULTURE, BLOOD (ROUTINE X 2)
Culture: NO GROWTH
Culture: NO GROWTH

## 2020-05-05 LAB — CULTURE, BLOOD (ROUTINE X 2)
Culture: NO GROWTH
Culture: NO GROWTH
Special Requests: ADEQUATE

## 2020-05-12 ENCOUNTER — Ambulatory Visit (INDEPENDENT_AMBULATORY_CARE_PROVIDER_SITE_OTHER): Payer: Commercial Managed Care - PPO | Admitting: Family Medicine

## 2020-05-12 ENCOUNTER — Other Ambulatory Visit: Payer: Self-pay

## 2020-05-12 ENCOUNTER — Encounter: Payer: Self-pay | Admitting: Family Medicine

## 2020-05-12 VITALS — BP 184/109 | HR 68 | Ht 69.0 in | Wt 226.5 lb

## 2020-05-12 DIAGNOSIS — R0789 Other chest pain: Secondary | ICD-10-CM

## 2020-05-12 DIAGNOSIS — G4733 Obstructive sleep apnea (adult) (pediatric): Secondary | ICD-10-CM | POA: Diagnosis not present

## 2020-05-12 DIAGNOSIS — I10 Essential (primary) hypertension: Secondary | ICD-10-CM | POA: Diagnosis not present

## 2020-05-12 DIAGNOSIS — Z720 Tobacco use: Secondary | ICD-10-CM | POA: Diagnosis not present

## 2020-05-12 DIAGNOSIS — R569 Unspecified convulsions: Secondary | ICD-10-CM | POA: Diagnosis not present

## 2020-05-12 MED ORDER — LISINOPRIL 40 MG PO TABS
40.0000 mg | ORAL_TABLET | Freq: Every day | ORAL | 3 refills | Status: DC
Start: 2020-05-12 — End: 2021-06-25

## 2020-05-12 MED ORDER — NEBIVOLOL HCL 10 MG PO TABS
10.0000 mg | ORAL_TABLET | Freq: Every day | ORAL | 1 refills | Status: DC
Start: 2020-05-12 — End: 2020-05-19

## 2020-05-12 NOTE — Assessment & Plan Note (Signed)
Non compliant with CPAP after positive study. Will concentrate on getting BP under control as we refer to respiratory company.

## 2020-05-12 NOTE — Progress Notes (Signed)
Established Patient Office Visit  SUBJECTIVE:  Subjective  Patient ID: Jake Kirby, male    DOB: 06/01/1966  Age: 54 y.o. MRN: [id number]  CC:  Chief Complaint  Patient presents with  . Hospitalization Follow-up    HPI Jake Kirby is a 54 y.o. male presenting today for Hospital Follow Up    Past Medical History:  Diagnosis Date  . Hypertension     Past Surgical History:  Procedure Laterality Date  . BACK SURGERY    . COLONOSCOPY WITH PROPOFOL N/A 02/12/2019   Procedure: COLONOSCOPY WITH PROPOFOL;  Surgeon: Virgel Manifold, MD;  Location: [facility] ENDOSCOPY;  Service: Endoscopy;  Laterality: N/A;    History reviewed. No pertinent family history.  Social History   Socioeconomic History  . Marital status: Married    Spouse name: Not on file  . Number of children: Not on file  . Years of education: Not on file  . Highest education level: Not on file  Occupational History  . Not on file  Tobacco Use  . Smoking status: Current Some Day Smoker    Types: Cigarettes  . Smokeless tobacco: Never Used  Vaping Use  . Vaping Use: Never used  Substance and Sexual Activity  . Alcohol use: Yes    Alcohol/week: 5.0 standard drinks    Types: 5 Cans of beer per week  . Drug use: No  . Sexual activity: Not on file  Other Topics Concern  . Not on file  Social History Narrative  . Not on file   Social Determinants of Health   Financial Resource Strain:   . Difficulty of Paying Living Expenses: Not on file  Food Insecurity:   . Worried About Charity fundraiser in the Last Year: Not on file  . Ran Out of Food in the Last Year: Not on file  Transportation Needs:   . Lack of Transportation (Medical): Not on file  . Lack of Transportation (Non-Medical): Not on file  Physical Activity:   . Days of Exercise per Week: Not on file  . Minutes of Exercise per Session: Not on file  Stress:   . Feeling of Stress : Not on file  Social Connections:   . Frequency of  Communication with Friends and Family: Not on file  . Frequency of Social Gatherings with Friends and Family: Not on file  . Attends Religious Services: Not on file  . Active Member of Clubs or Organizations: Not on file  . Attends Archivist Meetings: Not on file  . Marital Status: Not on file  Intimate Partner Violence:   . Fear of Current or Ex-Partner: Not on file  . Emotionally Abused: Not on file  . Physically Abused: Not on file  . Sexually Abused: Not on file     Current Outpatient Medications:  .  acetaminophen (TYLENOL) 325 MG tablet, Take 2 tablets (650 mg total) by mouth every 6 (six) hours as needed for mild pain, fever or headache., Disp: 20 tablet, Rfl: 0 .  levETIRAcetam (KEPPRA) 500 MG tablet, Take 1 tablet (500 mg total) by mouth 2 (two) times daily., Disp: 60 tablet, Rfl: 0 .  lisinopril (ZESTRIL) 40 MG tablet, Take 1 tablet (40 mg total) by mouth daily., Disp: 30 tablet, Rfl: 3 .  nebivolol (BYSTOLIC) 10 MG tablet, Take 1 tablet (10 mg total) by mouth daily., Disp: 30 tablet, Rfl: 1 .  polyethylene glycol (MIRALAX / GLYCOLAX) 17 g packet, Take 17 g by mouth  daily as needed for moderate constipation., Disp: 14 each, Rfl: 0   No Known Allergies  ROS Review of Systems   OBJECTIVE:    Physical Exam  BP (!) 184/109   Pulse 68   Ht 5\' 9"  (1.753 m)   Wt 226 lb 8 oz (102.7 kg)   BMI 33.45 kg/m  Wt Readings from Last 3 Encounters:  05/12/20 226 lb 8 oz (102.7 kg)  05/02/20 254 lb 10.1 oz (115.5 kg)  04/28/20 275 lb (124.7 kg)    Health Maintenance Due  Topic Date Due  . Hepatitis C Screening  Never done  . COVID-19 Vaccine (1) Never done  . TETANUS/TDAP  Never done    There are no preventive care reminders to display for this patient.  CBC Latest Ref Rng & Units 05/02/2020 05/01/2020 04/29/2020  WBC 4.0 - 10.5 K/uL 8.0 8.7 5.9  Hemoglobin 13.0 - 17.0 g/dL 13.0 13.2 13.6  Hematocrit 39 - 52 % 41.7 42.5 44.3  Platelets 150 - 400 K/uL 226 230  237   CMP Latest Ref Rng & Units 05/02/2020 05/01/2020 04/30/2020  Glucose 70 - 99 mg/dL 109(H) 126(H) 109(H)  BUN 6 - 20 mg/dL 12 10 8   Creatinine 0.61 - 1.24 mg/dL 1.14 1.16 0.89  Sodium 135 - 145 mmol/L 139 140 141  Potassium 3.5 - 5.1 mmol/L 3.6 3.6 3.5  Chloride 98 - 111 mmol/L 103 103 105  CO2 22 - 32 mmol/L 27 27 28   Calcium 8.9 - 10.3 mg/dL 8.8(L) 8.7(L) 8.7(L)  Total Protein 6.5 - 8.1 g/dL 6.8 6.9 -  Total Bilirubin 0.3 - 1.2 mg/dL 0.2(L) 0.5 -  Alkaline Phos 38 - 126 U/L 58 63 -  AST 15 - 41 U/L 13(L) 13(L) -  ALT 0 - 44 U/L 16 14 -    No results found for: TSH Lab Results  Component Value Date   ALBUMIN 3.2 (L) 05/02/2020   ANIONGAP 9 05/02/2020   No results found for: CHOL, HDL, LDLCALC, CHOLHDL No results found for: TRIG Lab Results  Component Value Date   HGBA1C 5.8 (H) 04/29/2020      ASSESSMENT & PLAN:   Problem List Items Addressed This Visit      Cardiovascular and Mediastinum   Essential hypertension - Primary    Pt discharged from hospital 5 days ago with new onset seizure do and HTN episode. He has been started on anti seizure medication without any activity since dc. Today patient is feeling exhausted with chest pressure left side, no headache.  Cranial nerves 2-12 intact. No acute changes in ECG compared to 4 days ago.       Relevant Medications   nebivolol (BYSTOLIC) 10 MG tablet   lisinopril (ZESTRIL) 40 MG tablet     Respiratory   Sleep apnea, obstructive    Non compliant with CPAP after positive study. Will concentrate on getting BP under control as we refer to respiratory company.         Other   Tobacco abuse    Smokes occasionally, not everyday, depends on social situation.       Seizure ([place])    No seizure activity since d/c from hospital 5 days ago, he is not feeling well says that he is exhausted a lot and does not feel like doing anything, does have some ataxia when BP is elevated.           Meds ordered this encounter   Medications  . nebivolol (BYSTOLIC) 10 MG  tablet    Sig: Take 1 tablet (10 mg total) by mouth daily.    Dispense:  30 tablet    Refill:  1  . lisinopril (ZESTRIL) 40 MG tablet    Sig: Take 1 tablet (40 mg total) by mouth daily.    Dispense:  30 tablet    Refill:  3      Follow-up: Return in about 1 week (around 05/19/2020).    Beckie Salts, [place] [address] [place], [place] [address]

## 2020-05-12 NOTE — Assessment & Plan Note (Addendum)
Pt discharged from hospital 5 days ago with new onset seizure do and HTN episode. He has been started on anti seizure medication without any activity since dc. Today patient is feeling exhausted with chest pressure left side, no headache.  Cranial nerves 2-12 intact. No acute changes in ECG compared to 4 days ago.

## 2020-05-12 NOTE — Assessment & Plan Note (Signed)
No seizure activity since d/c from hospital 5 days ago, he is not feeling well says that he is exhausted a lot and does not feel like doing anything, does have some ataxia when BP is elevated.

## 2020-05-12 NOTE — Assessment & Plan Note (Signed)
Smokes occasionally, not everyday, depends on social situation.

## 2020-05-17 ENCOUNTER — Other Ambulatory Visit: Payer: Self-pay

## 2020-05-17 ENCOUNTER — Ambulatory Visit (INDEPENDENT_AMBULATORY_CARE_PROVIDER_SITE_OTHER): Payer: Commercial Managed Care - PPO | Admitting: Internal Medicine

## 2020-05-17 ENCOUNTER — Encounter: Payer: Self-pay | Admitting: Internal Medicine

## 2020-05-17 VITALS — BP 149/91 | HR 68 | Ht 68.0 in | Wt 266.5 lb

## 2020-05-17 DIAGNOSIS — R0602 Shortness of breath: Secondary | ICD-10-CM | POA: Diagnosis not present

## 2020-05-17 DIAGNOSIS — G40909 Epilepsy, unspecified, not intractable, without status epilepticus: Secondary | ICD-10-CM | POA: Diagnosis not present

## 2020-05-17 DIAGNOSIS — I10 Essential (primary) hypertension: Secondary | ICD-10-CM

## 2020-05-17 DIAGNOSIS — I69398 Other sequelae of cerebral infarction: Secondary | ICD-10-CM

## 2020-05-17 DIAGNOSIS — R079 Chest pain, unspecified: Secondary | ICD-10-CM

## 2020-05-17 MED ORDER — CHLORTHALIDONE 25 MG PO TABS: 25.0000 mg | ORAL_TABLET | Freq: Every day | ORAL | 3 refills | Status: DC

## 2020-05-17 NOTE — Patient Instructions (Signed)
Please sign release for get recent lab work from Dr [employer] office.   Medication Instructions:  Your physician has recommended you make the following change in your medication:  1- START Chlorthalidone 25 mg by mouth once a day.  *If you need a refill on your cardiac medications before your next appointment, please call your pharmacy*  Lab Work: none If you have labs (blood work) drawn today and your tests are completely normal, you will receive your results only by:  [place] (if you have MyChart) OR  A paper copy in the mail If you have any lab test that is abnormal or we need to change your treatment, we will call you to review the results.   Testing/Procedures: Your physician has requested that you have an echocardiogram. Echocardiography is a painless test that uses sound waves to create images of your heart. It provides your doctor with information about the size and shape of your heart and how well your hearts chambers and valves are working. This procedure takes approximately one hour. There are no restrictions for this procedure. You may get an IV, if needed, to receive an ultrasound enhancing agent through to better visualize your heart.    Your physician has requested that you have a carotid duplex. This test is an ultrasound of the carotid arteries in your neck. It looks at blood flow through these arteries that supply the brain with blood. Allow one hour for this exam. There are no restrictions or special instructions.   Follow-Up: At [facility], you and your health needs are our priority.  As part of our continuing mission to provide you with exceptional heart care, we have created designated Provider Care Teams.  These Care Teams include your primary Cardiologist (physician) and Advanced Practice Providers (APPs -  Physician Assistants and Nurse Practitioners) who all work together to provide you with the care you need, when you need it.  We recommend signing  up for the patient portal called "MyChart".  Sign up information is provided on this After Visit Summary.  MyChart is used to connect with patients for Virtual Visits (Telemedicine).  Patients are able to view lab/test results, encounter notes, upcoming appointments, etc.  Non-urgent messages can be sent to your provider as well.   To learn more about what you can do with MyChart, go to [email or web address].    Your next appointment:   1 month(s)  The format for your next appointment:   In Person  Provider:   You may see DR Harrell Gave END or one of the following Advanced Practice Providers on your designated Care Team:    Murray Hodgkins, NP  Christell Faith, PA-C  Marrianne Mood, PA-C  Cadence Kathlen Mody, [place]    Echocardiogram An echocardiogram is a procedure that uses painless sound waves (ultrasound) to produce an image of the heart. Images from an echocardiogram can provide important information about:  Signs of coronary artery disease (CAD).  Aneurysm detection. An aneurysm is a weak or damaged part of an artery wall that bulges out from the normal force of blood pumping through the body.  Heart size and shape. Changes in the size or shape of the heart can be associated with certain conditions, including heart failure, aneurysm, and CAD.  Heart muscle function.  Heart valve function.  Signs of a past heart attack.  Fluid buildup around the heart.  Thickening of the heart muscle.  A tumor or infectious growth around the heart valves. Tell a health care provider about:  Any allergies you have.  All medicines you are taking, including vitamins, herbs, eye drops, creams, and over-the-counter medicines.  Any blood disorders you have.  Any surgeries you have had.  Any medical conditions you have.  Whether you are pregnant or may be pregnant. What are the risks? Generally, this is a safe procedure. However, problems may occur, including:  Allergic reaction to dye  (contrast) that may be used during the procedure. What happens before the procedure? No specific preparation is needed. You may eat and drink normally. What happens during the procedure?   An IV tube may be inserted into one of your veins.  You may receive contrast through this tube. A contrast is an injection that improves the quality of the pictures from your heart.  A gel will be applied to your chest.  A wand-like tool (transducer) will be moved over your chest. The gel will help to transmit the sound waves from the transducer.  The sound waves will harmlessly bounce off of your heart to allow the heart images to be captured in real-time motion. The images will be recorded on a computer. The procedure may vary among health care providers and hospitals. What happens after the procedure?  You may return to your normal, everyday life, including diet, activities, and medicines, unless your health care provider tells you not to do that. Summary  An echocardiogram is a procedure that uses painless sound waves (ultrasound) to produce an image of the heart.  Images from an echocardiogram can provide important information about the size and shape of your heart, heart muscle function, heart valve function, and fluid buildup around your heart.  You do not need to do anything to prepare before this procedure. You may eat and drink normally.  After the echocardiogram is completed, you may return to your normal, everyday life, unless your health care provider tells you not to do that. This information is not intended to replace advice given to you by your health care provider. Make sure you discuss any questions you have with your health care provider. Document Revised: 10/29/2018 Document Reviewed: 08/10/2016 Elsevier Patient Education  [place].

## 2020-05-17 NOTE — Progress Notes (Signed)
New Outpatient Visit Date: 05/17/2020  Referring Provider: Beckie Salts, [address] [place],  [place] [address]  Chief Complaint: Hypertension, shortness of breath, and chest pain  HPI:  [name] is a 54 y.o. male who is being seen today for the evaluation of hypertension at the request of [name]. He has a history of hypertension, obstructive sleep apnea, and recent onset of seizures. He was hospitalized earlier this month. He had been feeling lethargic and then had a witnessed seizure that began with arm shaking and subsequent fall to the ground with generalized seizure activity for 1 to 2 minutes. He was given midazolam, with hospital notes suggesting that he continued to have seizure activity in spite of this head CT was notable for an intracranial lipoma and remote left parietal stroke versus focal cortical dysplasia. He was ultimately transferred to [facility] for closer neurologic monitoring.  Since being discharged, [name] has felt quite fatigued.  In retrospect, the fatigue began a few days before his seizers started.  He also notes considerable dyspnea with even mild activity, new since his hospitalization.  He also has orthopnea, noting that he has been diagnosed with sleep apnea but does not have functional CPAP machine at this time.  [name] has actually lost weight; he estimated 15-20 pounds over the last 6 months (intentional).  Mr. Sitar also reports random pain in the chest under the left axially, which has been happening for "quite a while."  The pain is random and not exertional.  He describe the discomfort as "being pulled with a pair of pliers."  The pain often lasts all day.  He has tried acetaminophen with minimal relief.  Mr. Rieman reports having undergone a stress test with Dr. Lavera Guise about a year ago due to hypertension.  He believes that the test was normal.  Mr. Schippers reports poor blood pressure control for several months.  Recent home BP readings have been  in the 160-180/90-110 range.  He has also experienced worsening low back and leg pain/weakness.  This is not entirely new but notes that his symptoms have worsened since his surgery.  He had remote surgery on his low back and wonders if his seizures may have damaged something.  He is scheduled for neurology follow-up in January.  --------------------------------------------------------------------------------------------------  Cardiovascular History & Procedures: Cardiovascular Problems:  Chest pain  Shortness of breath  Risk Factors:  Possible stroke, hypertension, morbid obesity, and male gender  Cath/PCI:  None  CV Surgery:  None  EP Procedures and Devices:  None  Non-Invasive Evaluation(s):  None available; reports normal stress test with Dr. Lavera Guise ~1 year ago.  Recent CV Pertinent Labs: Lab Results  Component Value Date   INR 1.0 04/27/2020   K 3.6 05/02/2020   MG 2.3 05/02/2020   BUN 12 05/02/2020   CREATININE 1.14 05/02/2020    --------------------------------------------------------------------------------------------------  Past Medical History:  Diagnosis Date  . Hypertension   . Seizure ([place])   . Stroke [facility])     Past Surgical History:  Procedure Laterality Date  . BACK SURGERY    . COLONOSCOPY WITH PROPOFOL N/A 02/12/2019   Procedure: COLONOSCOPY WITH PROPOFOL;  Surgeon: Virgel Manifold, MD;  Location: [facility] ENDOSCOPY;  Service: Endoscopy;  Laterality: N/A;    Current Meds  Medication Sig  . acetaminophen (TYLENOL) 325 MG tablet Take 2 tablets (650 mg total) by mouth every 6 (six) hours as needed for mild pain, fever or headache.  Marland Kitchen aspirin EC 81 MG  tablet Take 81 mg by mouth daily. Swallow whole.  . levETIRAcetam (KEPPRA) 500 MG tablet Take 1 tablet (500 mg total) by mouth 2 (two) times daily.  Marland Kitchen lisinopril (ZESTRIL) 40 MG tablet Take 1 tablet (40 mg total) by mouth daily.  . nebivolol (BYSTOLIC) 10 MG tablet Take 1 tablet (10 mg total)  by mouth daily.  . polyethylene glycol (MIRALAX / GLYCOLAX) 17 g packet Take 17 g by mouth daily as needed for moderate constipation.    Allergies: Patient has no known allergies.  Social History   Tobacco Use  . Smoking status: Current Some Day Smoker    Years: 25.00    Types: Cigarettes  . Smokeless tobacco: Never Used  . Tobacco comment: social smoker when having a beer.   Vaping Use  . Vaping Use: Never used  Substance Use Topics  . Alcohol use: Yes    Alcohol/week: 5.0 standard drinks    Types: 5 Cans of beer per week  . Drug use: No    Family History  Problem Relation Age of Onset  . Hypertension Mother   . Diabetes Mother   . Heart attack Father 15    Review of Systems: A 12-system review of systems was performed and was negative except as noted in the HPI.  --------------------------------------------------------------------------------------------------  Physical Exam: BP (!) 149/91 (BP Location: Right Arm, Patient Position: Sitting, Cuff Size: Large)   Pulse 68   Ht 5\' 8"  (1.727 m)   Wt 266 lb 8 oz (120.9 kg)   SpO2 96%   BMI 40.52 kg/m    Position Blood pressure (mmHg) Heart rate (bpm)  Lying 145/90 67  Sitting 152/98 67  Standing 191/116 67  Standing (3 minutes) Unable to obtain (pain standing) Unable to obtain (pain standing)     General:  NAD; accompanied by his wife. HEENT: No conjunctival pallor or scleral icterus. Facemask in place. Neck: Supple without lymphadenopathy, thyromegaly, JVD, or HJR, though body habitus limits evaluation. No carotid bruit. Lungs: Normal work of breathing. Clear to auscultation bilaterally without wheezes or crackles. Heart: Regular rate and rhythm with 2/6 systolic murmur.  No rubs or gallops.  Unable to assess PMI due to body habitus. Abd: Bowel sounds present. Soft, NT/ND.  Unable to assess HSM due to body habitus. Ext: No lower extremity edema. Radial, PT, and DP pulses are 2+ bilaterally Skin: Warm and dry  without rash. Neuro: CNIII-XII intact. 5/5 UE strenght.  4/5 hip flexion bilaterally due to pain. Psych: Normal mood and affect.  EKG:  Normal sinus rhythm with 1st degree AV block (PR interval 210 ms), borderline LVH, and non-specific ST/T changes.  Lab Results  Component Value Date   WBC 8.0 05/02/2020   HGB 13.0 05/02/2020   HCT 41.7 05/02/2020   MCV 88.2 05/02/2020   PLT 226 05/02/2020    Lab Results  Component Value Date   NA 139 05/02/2020   K 3.6 05/02/2020   CL 103 05/02/2020   CO2 27 05/02/2020   BUN 12 05/02/2020   CREATININE 1.14 05/02/2020   GLUCOSE 109 (H) 05/02/2020   ALT 16 05/02/2020    --------------------------------------------------------------------------------------------------  ASSESSMENT AND PLAN: Shortness of breath, chest pain, and heart murmur: Symptoms have been present for weeks to months but seem to have worsened following seizures earlier this month.  Examination is notable for 2/6 systolic murmur.  EKG shows LVH with non-specific ST/T changes.  I have recommended that we obtain a transthoracic echocardiogram for furhter evaluation.  If  this does not show any significant structural abnormalities, we will need to consider noninvasive ischemia testing (MPI versus CTA).  Alternatively, if cardiomyopathy/wall motion abnormality is apparent, I would favor proceeding with catheterization.  In the meantime, we will request records regarding prior labs/testing from Dr. Jennette Kettle office.  Mr. Fortson should continue aspirin, particularly given concern for possible stroke.  Hypertension: Blood pressure suboptimally controlled.  It seems to worsen with standing, most likely exacerbated by leg pain.  I have recommended adding chlorthalidone 25 mg daily.  We will plan to recheck a BMP in ~1 month when Mr. Kangas returns for follow-up.  He should continue current doses of lisinopril and nebivolol, though we will need to monitor his 1st degree AV block in the setting of  beta blocker use.  Seizure disorder: Description of the events is most consistent with a seizure rather than cardiogenic syncope.  We will obtain an echo, as above.  Given concern for possible stroke, we will also obtain carotid Dopplers and request results of recent lipid panel from his PCP's office.  Morbid obesity: BMP > 40.  Recommend weight loss through diet and activity, as tolerated.  Follow-up: Return to clinic in 1 month.  Nelva Bush, MD 05/17/2020 8:56 PM

## 2020-05-19 ENCOUNTER — Ambulatory Visit (INDEPENDENT_AMBULATORY_CARE_PROVIDER_SITE_OTHER): Payer: Commercial Managed Care - PPO | Admitting: Family Medicine

## 2020-05-19 ENCOUNTER — Encounter: Payer: Self-pay | Admitting: Family Medicine

## 2020-05-19 ENCOUNTER — Other Ambulatory Visit: Payer: Self-pay

## 2020-05-19 VITALS — BP 168/98 | HR 88 | Ht 68.0 in | Wt 264.2 lb

## 2020-05-19 DIAGNOSIS — Z125 Encounter for screening for malignant neoplasm of prostate: Secondary | ICD-10-CM | POA: Diagnosis not present

## 2020-05-19 DIAGNOSIS — G4733 Obstructive sleep apnea (adult) (pediatric): Secondary | ICD-10-CM | POA: Diagnosis not present

## 2020-05-19 DIAGNOSIS — I10 Essential (primary) hypertension: Secondary | ICD-10-CM

## 2020-05-19 MED ORDER — NEBIVOLOL HCL 20 MG PO TABS: 20.0000 mg | ORAL_TABLET | Freq: Two times a day (BID) | ORAL | 2 refills | Status: DC

## 2020-05-19 NOTE — Assessment & Plan Note (Signed)
Here today after increasing his Meds 2 weeks ago, increased Bystolic from 5 mg BID to 10 Mg BID. His BP is down 20 systolic points and 10 diastolic points. Today I am increasing to max dose 20 mg BID. Also since last visit he has been evaluated by Cardiology- They ordered Echo and Carotid Studies, they also requested Lipid profile, which I will do today.

## 2020-05-19 NOTE — Assessment & Plan Note (Signed)
Sleep apnea test performed last year that recommended CPAP but he has never started the machine. Today I am ordering consult with [employer].

## 2020-05-19 NOTE — Assessment & Plan Note (Signed)
PSA drawn today

## 2020-05-19 NOTE — Progress Notes (Signed)
Established Patient Office Visit  SUBJECTIVE:  Subjective  Patient ID: Jake Kirby, male    DOB: 02-25-1966  Age: 54 y.o. MRN: [id number]  CC:  Chief Complaint  Patient presents with  . Hypertension    2 week follow up for BP recheck and discuss recent visit that patient had with his cardiologist    HPI Jake Kirby is a 54 y.o. male presenting today for fu on htn management, meds changed last visit.    Past Medical History:  Diagnosis Date  . Hypertension   . Seizure ([place])   . Stroke [facility])     Past Surgical History:  Procedure Laterality Date  . BACK SURGERY    . COLONOSCOPY WITH PROPOFOL N/A 02/12/2019   Procedure: COLONOSCOPY WITH PROPOFOL;  Surgeon: Virgel Manifold, MD;  Location: [facility] ENDOSCOPY;  Service: Endoscopy;  Laterality: N/A;    Family History  Problem Relation Age of Onset  . Hypertension Mother   . Diabetes Mother   . Heart attack Father 35    Social History   Socioeconomic History  . Marital status: Married    Spouse name: Not on file  . Number of children: Not on file  . Years of education: Not on file  . Highest education level: Not on file  Occupational History  . Not on file  Tobacco Use  . Smoking status: Current Some Day Smoker    Years: 25.00    Types: Cigarettes  . Smokeless tobacco: Never Used  . Tobacco comment: social smoker when having a beer.   Vaping Use  . Vaping Use: Never used  Substance and Sexual Activity  . Alcohol use: Yes    Alcohol/week: 5.0 standard drinks    Types: 5 Cans of beer per week  . Drug use: No  . Sexual activity: Not on file  Other Topics Concern  . Not on file  Social History Narrative  . Not on file   Social Determinants of Health   Financial Resource Strain:   . Difficulty of Paying Living Expenses: Not on file  Food Insecurity:   . Worried About Charity fundraiser in the Last Year: Not on file  . Ran Out of Food in the Last Year: Not on file  Transportation Needs:   . Lack of  Transportation (Medical): Not on file  . Lack of Transportation (Non-Medical): Not on file  Physical Activity:   . Days of Exercise per Week: Not on file  . Minutes of Exercise per Session: Not on file  Stress:   . Feeling of Stress : Not on file  Social Connections:   . Frequency of Communication with Friends and Family: Not on file  . Frequency of Social Gatherings with Friends and Family: Not on file  . Attends Religious Services: Not on file  . Active Member of Clubs or Organizations: Not on file  . Attends Archivist Meetings: Not on file  . Marital Status: Not on file  Intimate Partner Violence:   . Fear of Current or Ex-Partner: Not on file  . Emotionally Abused: Not on file  . Physically Abused: Not on file  . Sexually Abused: Not on file     Current Outpatient Medications:  .  acetaminophen (TYLENOL) 325 MG tablet, Take 2 tablets (650 mg total) by mouth every 6 (six) hours as needed for mild pain, fever or headache., Disp: 20 tablet, Rfl: 0 .  aspirin EC 81 MG tablet, Take 81 mg by  mouth daily. Swallow whole., Disp: , Rfl:  .  chlorthalidone (HYGROTON) 25 MG tablet, Take 1 tablet (25 mg total) by mouth daily., Disp: 30 tablet, Rfl: 3 .  levETIRAcetam (KEPPRA) 500 MG tablet, Take 1 tablet (500 mg total) by mouth 2 (two) times daily., Disp: 60 tablet, Rfl: 0 .  lisinopril (ZESTRIL) 40 MG tablet, Take 1 tablet (40 mg total) by mouth daily., Disp: 30 tablet, Rfl: 3 .  nebivolol 20 MG TABS, Take 1 tablet (20 mg total) by mouth 2 (two) times daily., Disp: 60 tablet, Rfl: 2 .  polyethylene glycol (MIRALAX / GLYCOLAX) 17 g packet, Take 17 g by mouth daily as needed for moderate constipation., Disp: 14 each, Rfl: 0   No Known Allergies  ROS Review of Systems  Constitutional: Negative.   HENT: Negative.   Eyes: Negative.   Respiratory: Negative.   Cardiovascular: Negative.   Gastrointestinal: Negative.   Musculoskeletal: Negative.   Skin: Negative.     Allergic/Immunologic: Negative.   Neurological: Negative.   Hematological: Negative.   Psychiatric/Behavioral: Negative.      OBJECTIVE:    Physical Exam Constitutional:      Appearance: He is obese.  Cardiovascular:     Rate and Rhythm: Normal rate.     Pulses: Normal pulses.  Abdominal:     Palpations: Abdomen is soft.  Musculoskeletal:     Cervical back: Normal range of motion.  Skin:    General: Skin is warm.  Psychiatric:        Mood and Affect: Mood normal.     BP (!) 168/98   Pulse 88   Ht 5\' 8"  (1.727 m)   Wt 264 lb 3.2 oz (119.8 kg)   BMI 40.17 kg/m  Wt Readings from Last 3 Encounters:  05/19/20 264 lb 3.2 oz (119.8 kg)  05/17/20 266 lb 8 oz (120.9 kg)  05/12/20 226 lb 8 oz (102.7 kg)    Health Maintenance Due  Topic Date Due  . Hepatitis C Screening  Never done  . COVID-19 Vaccine (1) Never done  . TETANUS/TDAP  Never done    There are no preventive care reminders to display for this patient.  CBC Latest Ref Rng & Units 05/02/2020 05/01/2020 04/29/2020  WBC 4.0 - 10.5 K/uL 8.0 8.7 5.9  Hemoglobin 13.0 - 17.0 g/dL 13.0 13.2 13.6  Hematocrit 39 - 52 % 41.7 42.5 44.3  Platelets 150 - 400 K/uL 226 230 237   CMP Latest Ref Rng & Units 05/02/2020 05/01/2020 04/30/2020  Glucose 70 - 99 mg/dL 109(H) 126(H) 109(H)  BUN 6 - 20 mg/dL 12 10 8   Creatinine 0.61 - 1.24 mg/dL 1.14 1.16 0.89  Sodium 135 - 145 mmol/L 139 140 141  Potassium 3.5 - 5.1 mmol/L 3.6 3.6 3.5  Chloride 98 - 111 mmol/L 103 103 105  CO2 22 - 32 mmol/L 27 27 28   Calcium 8.9 - 10.3 mg/dL 8.8(L) 8.7(L) 8.7(L)  Total Protein 6.5 - 8.1 g/dL 6.8 6.9 -  Total Bilirubin 0.3 - 1.2 mg/dL 0.2(L) 0.5 -  Alkaline Phos 38 - 126 U/L 58 63 -  AST 15 - 41 U/L 13(L) 13(L) -  ALT 0 - 44 U/L 16 14 -    No results found for: TSH Lab Results  Component Value Date   ALBUMIN 3.2 (L) 05/02/2020   ANIONGAP 9 05/02/2020   No results found for: CHOL, HDL, LDLCALC, CHOLHDL No results found for: TRIG Lab  Results  Component Value Date  HGBA1C 5.8 (H) 04/29/2020      ASSESSMENT & PLAN:   Problem List Items Addressed This Visit      Cardiovascular and Mediastinum   Essential hypertension    Here today after increasing his Meds 2 weeks ago, increased Bystolic from 5 mg BID to 10 Mg BID. His BP is down 20 systolic points and 10 diastolic points. Today I am increasing to max dose 20 mg BID. Also since last visit he has been evaluated by Cardiology- They ordered Echo and Carotid Studies, they also requested Lipid profile, which I will do today.       Relevant Medications   nebivolol 20 MG TABS   Other Relevant Orders   Lipid Profile     Respiratory   Sleep apnea, obstructive - Primary    Sleep apnea test performed last year that recommended CPAP but he has never started the machine. Today I am ordering consult with [employer].         Other   Screening for malignant neoplasm of prostate    PSA drawn today.       Relevant Orders   PSA      Meds ordered this encounter  Medications  . nebivolol 20 MG TABS    Sig: Take 1 tablet (20 mg total) by mouth 2 (two) times daily.    Dispense:  60 tablet    Refill:  2      Follow-up: No follow-ups on file.    Beckie Salts, [place] [address], [place], [place] [address]

## 2020-05-20 LAB — LIPID PANEL
Cholesterol: 263 mg/dL — ABNORMAL HIGH (ref <200)
HDL: 42 mg/dL (ref >=40)
LDL Cholesterol (Calc): 201 mg/dL (calc) — ABNORMAL HIGH
Non-HDL Cholesterol (Calc): 221 mg/dL (calc) — ABNORMAL HIGH (ref <130)
Total CHOL/HDL Ratio: 6.3 (calc) — ABNORMAL HIGH (ref <5.0)
Triglycerides: 83 mg/dL (ref <150)

## 2020-05-20 LAB — PSA: PSA: 0.66 ng/mL (ref <=4.0)

## 2020-05-20 LAB — EXTRA LAV TOP TUBE

## 2020-05-31 ENCOUNTER — Ambulatory Visit (INDEPENDENT_AMBULATORY_CARE_PROVIDER_SITE_OTHER): Payer: Commercial Managed Care - PPO | Admitting: Internal Medicine

## 2020-05-31 ENCOUNTER — Other Ambulatory Visit: Payer: Self-pay

## 2020-05-31 DIAGNOSIS — E785 Hyperlipidemia, unspecified: Secondary | ICD-10-CM

## 2020-06-01 LAB — LIPID PANEL
Cholesterol: 235 mg/dL — ABNORMAL HIGH (ref <200)
HDL: 39 mg/dL — ABNORMAL LOW (ref >=40)
LDL Cholesterol (Calc): 178 mg/dL (calc) — ABNORMAL HIGH
Non-HDL Cholesterol (Calc): 196 mg/dL (calc) — ABNORMAL HIGH (ref <130)
Total CHOL/HDL Ratio: 6 (calc) — ABNORMAL HIGH (ref <5.0)
Triglycerides: 79 mg/dL (ref <150)

## 2020-06-02 ENCOUNTER — Ambulatory Visit (INDEPENDENT_AMBULATORY_CARE_PROVIDER_SITE_OTHER): Payer: Commercial Managed Care - PPO | Admitting: Family Medicine

## 2020-06-02 ENCOUNTER — Other Ambulatory Visit: Payer: Self-pay

## 2020-06-02 ENCOUNTER — Encounter: Payer: Self-pay | Admitting: Family Medicine

## 2020-06-02 VITALS — BP 170/110 | HR 60 | Ht 68.0 in | Wt 258.8 lb

## 2020-06-02 DIAGNOSIS — I10 Essential (primary) hypertension: Secondary | ICD-10-CM | POA: Diagnosis not present

## 2020-06-02 DIAGNOSIS — G4733 Obstructive sleep apnea (adult) (pediatric): Secondary | ICD-10-CM | POA: Diagnosis not present

## 2020-06-02 DIAGNOSIS — E785 Hyperlipidemia, unspecified: Secondary | ICD-10-CM | POA: Diagnosis not present

## 2020-06-02 MED ORDER — ATORVASTATIN CALCIUM 20 MG PO TABS: 20.0000 mg | ORAL_TABLET | Freq: Every day | ORAL | 3 refills | Status: DC

## 2020-06-02 MED ORDER — AMLODIPINE BESYLATE 5 MG PO TABS: 5.0000 mg | ORAL_TABLET | Freq: Every day | ORAL | 3 refills | Status: DC

## 2020-06-02 NOTE — Assessment & Plan Note (Signed)
Htn still not at goal, energy level has improved some, denies CP, sob or H/a. Plan- start Norvasc 5 mg today fu 1 month

## 2020-06-02 NOTE — Assessment & Plan Note (Signed)
Patient has not been on CPAP since dx with sleep apnea due to difficulty contacting respiratory company. Since last visit the company has contacted patient and he will be getting a CPAP.

## 2020-06-02 NOTE — Assessment & Plan Note (Signed)
Patient has lost 10 lbs since last visit, he is eating baked foods and cutting down on portions. Success praised. FU 1 month

## 2020-06-02 NOTE — Assessment & Plan Note (Signed)
Repeat Lipid profile shows elevated LDL 170 with low HDL, per guidelines I will start Lipid lowering agent today.

## 2020-06-02 NOTE — Progress Notes (Signed)
Established Patient Office Visit  SUBJECTIVE:  Subjective  Patient ID: Jake Kirby, male    DOB: [birth year]  Age: 54 y.o. MRN: [id number]  CC:  Chief Complaint  Patient presents with  . Follow-up    Patient is here for a 2 week follow up for blood pressure recheck and fasting lipid results    HPI Jake Kirby is a 54 y.o. male presenting today for htn fu as well as OSA,   Past Medical History:  Diagnosis Date  . Hypertension   . Seizure ([place])   . Stroke [facility])     Past Surgical History:  Procedure Laterality Date  . BACK SURGERY    . COLONOSCOPY WITH PROPOFOL N/A 02/12/2019   Procedure: COLONOSCOPY WITH PROPOFOL;  Surgeon: Virgel Manifold, MD;  Location: [facility] ENDOSCOPY;  Service: Endoscopy;  Laterality: N/A;    Family History  Problem Relation Age of Onset  . Hypertension Mother   . Diabetes Mother   . Heart attack Father 87    Social History   Socioeconomic History  . Marital status: Married    Spouse name: Not on file  . Number of children: Not on file  . Years of education: Not on file  . Highest education level: Not on file  Occupational History  . Not on file  Tobacco Use  . Smoking status: Current Some Day Smoker    Years: 25.00    Types: Cigarettes  . Smokeless tobacco: Never Used  . Tobacco comment: social smoker when having a beer.   Vaping Use  . Vaping Use: Never used  Substance and Sexual Activity  . Alcohol use: Yes    Alcohol/week: 5.0 standard drinks    Types: 5 Cans of beer per week  . Drug use: No  . Sexual activity: Not on file  Other Topics Concern  . Not on file  Social History Narrative  . Not on file   Social Determinants of Health   Financial Resource Strain:   . Difficulty of Paying Living Expenses: Not on file  Food Insecurity:   . Worried About Charity fundraiser in the Last Year: Not on file  . Ran Out of Food in the Last Year: Not on file  Transportation Needs:   . Lack of Transportation (Medical): Not on  file  . Lack of Transportation (Non-Medical): Not on file  Physical Activity:   . Days of Exercise per Week: Not on file  . Minutes of Exercise per Session: Not on file  Stress:   . Feeling of Stress : Not on file  Social Connections:   . Frequency of Communication with Friends and Family: Not on file  . Frequency of Social Gatherings with Friends and Family: Not on file  . Attends Religious Services: Not on file  . Active Member of Clubs or Organizations: Not on file  . Attends Archivist Meetings: Not on file  . Marital Status: Not on file  Intimate Partner Violence:   . Fear of Current or Ex-Partner: Not on file  . Emotionally Abused: Not on file  . Physically Abused: Not on file  . Sexually Abused: Not on file     Current Outpatient Medications:  .  acetaminophen (TYLENOL) 325 MG tablet, Take 2 tablets (650 mg total) by mouth every 6 (six) hours as needed for mild pain, fever or headache., Disp: 20 tablet, Rfl: 0 .  amLODipine (NORVASC) 5 MG tablet, Take 1 tablet (5 mg total) by  mouth daily., Disp: 90 tablet, Rfl: 3 .  aspirin EC 81 MG tablet, Take 81 mg by mouth daily. Swallow whole., Disp: , Rfl:  .  atorvastatin (LIPITOR) 20 MG tablet, Take 1 tablet (20 mg total) by mouth daily., Disp: 90 tablet, Rfl: 3 .  chlorthalidone (HYGROTON) 25 MG tablet, Take 1 tablet (25 mg total) by mouth daily., Disp: 30 tablet, Rfl: 3 .  levETIRAcetam (KEPPRA) 500 MG tablet, Take 1 tablet (500 mg total) by mouth 2 (two) times daily., Disp: 60 tablet, Rfl: 0 .  lisinopril (ZESTRIL) 40 MG tablet, Take 1 tablet (40 mg total) by mouth daily., Disp: 30 tablet, Rfl: 3 .  nebivolol 20 MG TABS, Take 1 tablet (20 mg total) by mouth 2 (two) times daily., Disp: 60 tablet, Rfl: 2 .  polyethylene glycol (MIRALAX / GLYCOLAX) 17 g packet, Take 17 g by mouth daily as needed for moderate constipation., Disp: 14 each, Rfl: 0   No Known Allergies  ROS Review of Systems  Constitutional: Negative.     HENT: Negative.   Respiratory: Negative.   Cardiovascular: Negative.   Genitourinary: Negative.   Musculoskeletal: Negative.   Neurological: Positive for tremors.  Psychiatric/Behavioral: Negative.      OBJECTIVE:    Physical Exam Vitals and nursing note reviewed.  Constitutional:      Appearance: He is obese.  HENT:     Nose: Nose normal.     Mouth/Throat:     Mouth: Mucous membranes are moist.  Eyes:     Pupils: Pupils are equal, round, and reactive to light.  Cardiovascular:     Rate and Rhythm: Normal rate and regular rhythm.  Musculoskeletal:        General: Normal range of motion.     Cervical back: Normal range of motion.  Skin:    General: Skin is warm.  Neurological:     General: No focal deficit present.     Mental Status: He is alert.  Psychiatric:        Mood and Affect: Mood normal.     BP (!) 170/110   Pulse 60   Ht 5\' 8"  (1.727 m)   Wt 258 lb 12.8 oz (117.4 kg)   BMI 39.35 kg/m  Wt Readings from Last 3 Encounters:  06/02/20 258 lb 12.8 oz (117.4 kg)  05/19/20 264 lb 3.2 oz (119.8 kg)  05/17/20 266 lb 8 oz (120.9 kg)    Health Maintenance Due  Topic Date Due  . Hepatitis C Screening  Never done  . COVID-19 Vaccine (1) Never done  . TETANUS/TDAP  Never done    There are no preventive care reminders to display for this patient.  CBC Latest Ref Rng & Units 05/02/2020 05/01/2020 04/29/2020  WBC 4.0 - 10.5 K/uL 8.0 8.7 5.9  Hemoglobin 13.0 - 17.0 g/dL 13.0 13.2 13.6  Hematocrit 39 - 52 % 41.7 42.5 44.3  Platelets 150 - 400 K/uL 226 230 237   CMP Latest Ref Rng & Units 05/02/2020 05/01/2020 04/30/2020  Glucose 70 - 99 mg/dL 109(H) 126(H) 109(H)  BUN 6 - 20 mg/dL 12 10 8   Creatinine 0.61 - 1.24 mg/dL 1.14 1.16 0.89  Sodium 135 - 145 mmol/L 139 140 141  Potassium 3.5 - 5.1 mmol/L 3.6 3.6 3.5  Chloride 98 - 111 mmol/L 103 103 105  CO2 22 - 32 mmol/L 27 27 28   Calcium 8.9 - 10.3 mg/dL 8.8(L) 8.7(L) 8.7(L)  Total Protein 6.5 - 8.1 g/dL 6.8  6.9 -  Total Bilirubin 0.3 - 1.2 mg/dL 0.2(L) 0.5 -  Alkaline Phos 38 - 126 U/L 58 63 -  AST 15 - 41 U/L 13(L) 13(L) -  ALT 0 - 44 U/L 16 14 -    No results found for: TSH Lab Results  Component Value Date   ALBUMIN 3.2 (L) 05/02/2020   ANIONGAP 9 05/02/2020   Lab Results  Component Value Date   CHOL 235 (H) 05/31/2020   CHOL 263 (H) 05/19/2020   HDL 39 (L) 05/31/2020   HDL 42 05/19/2020   LDLCALC 178 (H) 05/31/2020   LDLCALC 201 (H) 05/19/2020   CHOLHDL 6.0 (H) 05/31/2020   CHOLHDL 6.3 (H) 05/19/2020   Lab Results  Component Value Date   TRIG 79 05/31/2020   Lab Results  Component Value Date   HGBA1C 5.8 (H) 04/29/2020      ASSESSMENT & PLAN:   Problem List Items Addressed This Visit      Cardiovascular and Mediastinum   Essential hypertension - Primary    Htn still not at goal, energy level has improved some, denies CP, sob or H/a. Plan- start Norvasc 5 mg today fu 1 month      Relevant Medications   atorvastatin (LIPITOR) 20 MG tablet   amLODipine (NORVASC) 5 MG tablet     Respiratory   Sleep apnea, obstructive    Patient has not been on CPAP since dx with sleep apnea due to difficulty contacting respiratory company. Since last visit the company has contacted patient and he will be getting a CPAP.          Other   Morbid obesity ([place])    Patient has lost 10 lbs since last visit, he is eating baked foods and cutting down on portions. Success praised. FU 1 month      Hyperlipidemia    Repeat Lipid profile shows elevated LDL 170 with low HDL, per guidelines I will start Lipid lowering agent today.       Relevant Medications   atorvastatin (LIPITOR) 20 MG tablet   amLODipine (NORVASC) 5 MG tablet      Meds ordered this encounter  Medications  . atorvastatin (LIPITOR) 20 MG tablet    Sig: Take 1 tablet (20 mg total) by mouth daily.    Dispense:  90 tablet    Refill:  3  . amLODipine (NORVASC) 5 MG tablet    Sig: Take 1 tablet (5 mg total) by  mouth daily.    Dispense:  90 tablet    Refill:  3      Follow-up: No follow-ups on file.    Beckie Salts, [place] [address] [place], [place] [address]

## 2020-06-10 ENCOUNTER — Emergency Department
Admission: EM | Admit: 2020-06-10 | Discharge: 2020-06-10 | Disposition: A | Discharge status: Home / Self Care | Payer: Commercial Managed Care - PPO | Attending: Emergency Medicine | Admitting: Emergency Medicine

## 2020-06-10 ENCOUNTER — Other Ambulatory Visit: Payer: Self-pay

## 2020-06-10 ENCOUNTER — Emergency Department: Payer: Commercial Managed Care - PPO

## 2020-06-10 ENCOUNTER — Encounter: Payer: Self-pay | Admitting: Radiology

## 2020-06-10 DIAGNOSIS — Z79899 Other long term (current) drug therapy: Secondary | ICD-10-CM | POA: Insufficient documentation

## 2020-06-10 DIAGNOSIS — I10 Essential (primary) hypertension: Secondary | ICD-10-CM | POA: Diagnosis not present

## 2020-06-10 DIAGNOSIS — F17210 Nicotine dependence, cigarettes, uncomplicated: Secondary | ICD-10-CM | POA: Insufficient documentation

## 2020-06-10 DIAGNOSIS — Z7982 Long term (current) use of aspirin: Secondary | ICD-10-CM | POA: Diagnosis not present

## 2020-06-10 DIAGNOSIS — R079 Chest pain, unspecified: Secondary | ICD-10-CM | POA: Insufficient documentation

## 2020-06-10 LAB — BASIC METABOLIC PANEL
Anion gap: 15 (ref 5–15)
BUN: 27 mg/dL — ABNORMAL HIGH (ref 6–20)
CO2: 23 mmol/L (ref 22–32)
Calcium: 9 mg/dL (ref 8.9–10.3)
Chloride: 99 mmol/L (ref 98–111)
Creatinine, Ser: 1.6 mg/dL — ABNORMAL HIGH (ref 0.61–1.24)
GFR, Estimated: 51 mL/min — ABNORMAL LOW (ref >60)
Glucose, Bld: 117 mg/dL — ABNORMAL HIGH (ref 70–99)
Potassium: 3 mmol/L — ABNORMAL LOW (ref 3.5–5.1)
Sodium: 137 mmol/L (ref 135–145)

## 2020-06-10 LAB — CBC
HCT: 42.2 % (ref 39.0–52.0)
Hemoglobin: 13.3 g/dL (ref 13.0–17.0)
MCH: 27.5 pg (ref 26.0–34.0)
MCHC: 31.5 g/dL (ref 30.0–36.0)
MCV: 87.4 fL (ref 80.0–100.0)
Platelets: 204 10*3/uL (ref 150–400)
RBC: 4.83 MIL/uL (ref 4.22–5.81)
RDW: 12.4 % (ref 11.5–15.5)
WBC: 7.3 10*3/uL (ref 4.0–10.5)
nRBC: 0 % (ref 0.0–0.2)

## 2020-06-10 LAB — TROPONIN I (HIGH SENSITIVITY)
Troponin I (High Sensitivity): 6 ng/L (ref <18)
Troponin I (High Sensitivity): 7 ng/L (ref <18)

## 2020-06-10 MED ORDER — LIDOCAINE 5 % EX PTCH
1.0000 | MEDICATED_PATCH | CUTANEOUS | Status: DC
  Administered 2020-06-10: 1 via TRANSDERMAL
  Filled 2020-06-10: qty 1

## 2020-06-10 MED ORDER — POTASSIUM CHLORIDE CRYS ER 20 MEQ PO TBCR
40.0000 meq | EXTENDED_RELEASE_TABLET | Freq: Once | ORAL | Status: AC
  Administered 2020-06-10: 40 meq via ORAL
  Filled 2020-06-10: qty 2

## 2020-06-10 MED ORDER — KETOROLAC TROMETHAMINE 10 MG PO TABS: 10.0000 mg | ORAL_TABLET | Freq: Three times a day (TID) | ORAL | 0 refills | Status: DC | PRN

## 2020-06-10 MED ORDER — SODIUM CHLORIDE 0.9 % IV BOLUS
500.0000 mL | Freq: Once | INTRAVENOUS | Status: AC
  Administered 2020-06-10: 500 mL via INTRAVENOUS

## 2020-06-10 MED ORDER — LIDOCAINE 5 % EX PTCH: 1.0000 | MEDICATED_PATCH | Freq: Two times a day (BID) | CUTANEOUS | 0 refills | Status: AC

## 2020-06-10 MED ORDER — KETOROLAC TROMETHAMINE 30 MG/ML IJ SOLN
30.0000 mg | Freq: Once | INTRAMUSCULAR | Status: AC
  Administered 2020-06-10: 30 mg via INTRAVENOUS
  Filled 2020-06-10: qty 1

## 2020-06-10 NOTE — ED Notes (Signed)
EKG complete and handed to ED provider for review

## 2020-06-10 NOTE — ED Notes (Signed)
Pt no longer having seizure like activity and does not remember episode of being escorted back to ER room.

## 2020-06-10 NOTE — ED Provider Notes (Signed)
[facility] Emergency Department Provider Note   ____________________________________________   I have reviewed the triage vital signs and the nursing notes.   HISTORY  Chief Complaint Chest Pain   History limited by: Not Limited   HPI Jake Kirby is a 54 y.o. male who presents to the emergency department today because of concern for chest pain. The patient states that the pain started a few days ago.  Pain is located in his left chest.  Pain is worse with movement.  It is worse with palpitations.  Patient denies any significant shortness of breath or cough with the pain.  Denies any recent traumas or unusual activity.  No nausea or vomiting.   Records reviewed. Per medical record review patient has a history of HTN, seizure, CVA.  Past Medical History:  Diagnosis Date  . Hypertension   . Seizure ([place])   . Stroke [facility])     Patient Active Problem List   Diagnosis Date Noted  . Hyperlipidemia 06/02/2020  . Screening for malignant neoplasm of prostate 05/19/2020  . Chest pain of uncertain etiology 72/03/4708  . Seizure disorder as sequela of cerebrovascular accident ([place]) 05/17/2020  . Morbid obesity ([place]) 05/17/2020  . Shortness of breath 05/17/2020  . Sleep apnea, obstructive 05/12/2020  . Seizure ([place]) 04/27/2020  . Essential hypertension 04/12/2020  . Tobacco abuse 04/12/2020  . History of lumbar fusion (L5-S1) 06/16/2018  . Lumbar radiculopathy 06/16/2018  . Lumbar facet arthropathy 06/16/2018  . Chronic bilateral low back pain with bilateral sciatica 06/16/2018  . Chronic pain syndrome 06/16/2018    Past Surgical History:  Procedure Laterality Date  . BACK SURGERY    . COLONOSCOPY WITH PROPOFOL N/A 02/12/2019   Procedure: COLONOSCOPY WITH PROPOFOL;  Surgeon: Virgel Manifold, MD;  Location: [facility] ENDOSCOPY;  Service: Endoscopy;  Laterality: N/A;    Prior to Admission medications   Medication Sig Start Date End Date Taking?  Authorizing Provider  acetaminophen (TYLENOL) 325 MG tablet Take 2 tablets (650 mg total) by mouth every 6 (six) hours as needed for mild pain, fever or headache. 05/02/20   Raiford Noble Latif, DO  amLODipine (NORVASC) 5 MG tablet Take 1 tablet (5 mg total) by mouth daily. 06/02/20   Beckie Salts, FNP  aspirin EC 81 MG tablet Take 81 mg by mouth daily. Swallow whole.    [provider]  atorvastatin (LIPITOR) 20 MG tablet Take 1 tablet (20 mg total) by mouth daily. 06/02/20   Beckie Salts, FNP  chlorthalidone (HYGROTON) 25 MG tablet Take 1 tablet (25 mg total) by mouth daily. 05/17/20 08/15/20  End, Harrell Gave, MD  levETIRAcetam (KEPPRA) 500 MG tablet Take 1 tablet (500 mg total) by mouth 2 (two) times daily. 05/02/20   Sheikh, Georgina Quint Latif, DO  lisinopril (ZESTRIL) 40 MG tablet Take 1 tablet (40 mg total) by mouth daily. 05/12/20   Beckie Salts, FNP  nebivolol 20 MG TABS Take 1 tablet (20 mg total) by mouth 2 (two) times daily. 05/19/20 06/18/20  Beckie Salts, FNP  polyethylene glycol (MIRALAX / GLYCOLAX) 17 g packet Take 17 g by mouth daily as needed for moderate constipation. 05/02/20   Kerney Elbe, DO    Allergies Patient has no known allergies.  Family History  Problem Relation Age of Onset  . Hypertension Mother   . Diabetes Mother   . Heart attack Father 64    Social History Social History   Tobacco Use  . Smoking status: Current Some Day Smoker  Years: 25.00    Types: Cigarettes  . Smokeless tobacco: Never Used  . Tobacco comment: social smoker when having a beer.   Vaping Use  . Vaping Use: Never used  Substance Use Topics  . Alcohol use: Yes    Alcohol/week: 5.0 standard drinks    Types: 5 Cans of beer per week  . Drug use: No    Review of Systems Constitutional: No fever/chills Eyes: No visual changes. ENT: No sore throat. Cardiovascular: Positive for left chest pain. Respiratory: Denies shortness of breath. Gastrointestinal: No  abdominal pain.  No nausea, no vomiting.  No diarrhea.   Genitourinary: Negative for dysuria. Musculoskeletal: Negative for back pain. Skin: Negative for rash. Neurological: Positive for left sided weakness post cva. ____________________________________________   PHYSICAL EXAM:  VITAL SIGNS: ED Triage Vitals  Enc Vitals Group     BP 06/10/20 2018 (!) 147/84     Pulse Rate 06/10/20 2018 65     Resp 06/10/20 2018 20     Temp 06/10/20 2018 98.9 F (37.2 C)     Temp Source 06/10/20 2018 Oral     SpO2 06/10/20 2018 96 %     Weight 06/10/20 2019 256 lb (116.1 kg)     Height 06/10/20 2019 5\' 8"  (1.727 m)     Head Circumference --      Peak Flow --      Pain Score 06/10/20 2019 8   Constitutional: Alert and oriented.  Eyes: Conjunctivae are normal.  ENT      Head: Normocephalic and atraumatic.      Nose: No congestion/rhinnorhea.      Mouth/Throat: Mucous membranes are moist.      Neck: No stridor. Hematological/Lymphatic/Immunilogical: No cervical lymphadenopathy. Cardiovascular: Normal rate, regular rhythm.  No murmurs, rubs, or gallops.  Respiratory: Normal respiratory effort without tachypnea nor retractions. Breath sounds are clear and equal bilaterally. No wheezes/rales/rhonchi. Gastrointestinal: Soft and non tender. No rebound. No guarding.  Genitourinary: Deferred Musculoskeletal: Normal range of motion in all extremities. Tender to palpation over the left chest wall. Neurologic:  Normal speech and language. Left sided weakness.  Skin:  Skin is warm, dry and intact. No rash noted. Psychiatric: Mood and affect are normal. Speech and behavior are normal. Patient exhibits appropriate insight and judgment.  ____________________________________________    LABS (pertinent positives/negatives)  CBC wbc 7.3, hgb 13.3, plt 204 Trop hs 6 to 7 BMP na 137, k 3.0, glu 117, cr 1.60 ____________________________________________   EKG  I, Nance Pear, attending physician,  personally viewed and interpreted this EKG  EKG Time: 2014 Rate: 65 Rhythm: normal sinus rhythm Axis: normal Intervals: qtc 453 QRS: narrow, q waves v1 ST changes: no st elevation Impression: abnormal ekg  ____________________________________________    RADIOLOGY  CXR No acute abnormality  ____________________________________________   PROCEDURES  Procedures  ____________________________________________   INITIAL IMPRESSION / ASSESSMENT AND PLAN / ED COURSE  Pertinent labs & imaging results that were available during my care of the patient were reviewed by me and considered in my medical decision making (see chart for details).   Patient presented to the emergency department today because of concerns for chest pain.  On exam patient is quite tender to palpation over the chest wall.  This does recreate his pain.  Troponin negative x2 chest x-ray without concerning findings.  Doubt PE or dissection.  Do think musculoskeletal nature given that it is worse with palpation.  Patient did feel some improvement after medications.  Will plan on  discharge home.  Discussed findings plan with patient.  ____________________________________________   FINAL CLINICAL IMPRESSION(S) / ED DIAGNOSES  Final diagnoses:  Nonspecific chest pain     Note: This dictation was prepared with Dragon dictation. Any transcriptional errors that result from this process are unintentional     Nance Pear, MD 06/11/20 1535

## 2020-06-10 NOTE — ED Notes (Signed)
Patient provided warm blankets per request.

## 2020-06-10 NOTE — Discharge Instructions (Addendum)
Please seek medical attention for any high fevers, chest pain, shortness of breath, change in behavior, persistent vomiting, bloody stool or any other new or concerning symptoms.  

## 2020-06-10 NOTE — ED Notes (Addendum)
Pt appears to be having focal seizure upon being placed in ER bed. Pt noted to have left side gaze with rapid blinking that lasted ~2 minutes. MD notified.

## 2020-06-10 NOTE — ED Triage Notes (Signed)
Pt c/o of chest pain that started 1 week ago. Pt has hx of stroke in October and has been out of work. Pt states he has weakness on his left side.

## 2020-06-10 NOTE — ED Notes (Addendum)
Patient transported to x-ray. ?

## 2020-06-12 ENCOUNTER — Other Ambulatory Visit: Payer: Commercial Managed Care - PPO

## 2020-06-13 ENCOUNTER — Ambulatory Visit (INDEPENDENT_AMBULATORY_CARE_PROVIDER_SITE_OTHER): Payer: Commercial Managed Care - PPO

## 2020-06-13 ENCOUNTER — Other Ambulatory Visit: Payer: Self-pay

## 2020-06-13 DIAGNOSIS — I5189 Other ill-defined heart diseases: Secondary | ICD-10-CM

## 2020-06-13 DIAGNOSIS — R079 Chest pain, unspecified: Secondary | ICD-10-CM

## 2020-06-13 DIAGNOSIS — G40909 Epilepsy, unspecified, not intractable, without status epilepticus: Secondary | ICD-10-CM

## 2020-06-13 DIAGNOSIS — I77810 Thoracic aortic ectasia: Secondary | ICD-10-CM

## 2020-06-13 DIAGNOSIS — R0602 Shortness of breath: Secondary | ICD-10-CM | POA: Diagnosis not present

## 2020-06-13 DIAGNOSIS — I69398 Other sequelae of cerebral infarction: Secondary | ICD-10-CM

## 2020-06-13 HISTORY — DX: Other ill-defined heart diseases: I51.89

## 2020-06-13 HISTORY — DX: Thoracic aortic ectasia: I77.810

## 2020-06-13 LAB — ECHOCARDIOGRAM COMPLETE
AR max vel: 5.04 cm2
AV Area VTI: 4.78 cm2
AV Area mean vel: 4.64 cm2
AV Mean grad: 2 mmHg
AV Peak grad: 4 mmHg
Ao pk vel: 1 m/s
Area-P 1/2: 2.15 cm2
S' Lateral: 3.6 cm

## 2020-06-13 MED ORDER — PERFLUTREN LIPID MICROSPHERE
1.0000 mL | INTRAVENOUS | Status: AC | PRN
  Administered 2020-06-13: 2 mL via INTRAVENOUS

## 2020-06-19 NOTE — Progress Notes (Addendum)
Office Visit    Patient Name: Jake Kirby Date of Encounter: 06/20/2020  Primary Care Provider:  Cletis Athens, MD Primary Cardiologist:  Nelva Bush, MD Electrophysiologist:  None   Chief Complaint    Jake Kirby is a 54 y.o. male with a hx of HTN, OSA, seizure, mild dilation of ascending aorta, chest pain, dyspnea presents today for follow up after echocardiogram and carotid duplex.    Past Medical History    Past Medical History:  Diagnosis Date  . Hypertension   . Seizure ([place])   . Stroke [facility])    Past Surgical History:  Procedure Laterality Date  . BACK SURGERY    . COLONOSCOPY WITH PROPOFOL N/A 02/12/2019   Procedure: COLONOSCOPY WITH PROPOFOL;  Surgeon: Virgel Manifold, MD;  Location: [facility] ENDOSCOPY;  Service: Endoscopy;  Laterality: N/A;    Allergies  No Known Allergies  History of Present Illness    Jake Kirby is a 54 y.o. male with a hx of HTN, OSA, seizure, mild dilation of ascending aorta, chest pain, dyspnea last seen by Dr. Saunders Revel 05/17/20.  He was seen in consult by Dr. Saunders Revel for evaluation of hypertension. He was hospitalized 04/28/20 after witnessed seizure, head CT notable for intracranial lipoma and remote left parietal stroke versus focal cortical dysplasia.    When seen by Dr. Saunders Revel he noted fatigue, orthopnea without functional CPAP, nonexertional chest pain, and dyspnea on exertion. Murmur was noted on exam. He was recommended for echo and carotid duplex with consideration of cardiac CTA at follow up. He was started on Chlorthalidone for improved BP control.   Seen in the ED 06/10/20 due to left sided chest pain worse with movement. He chest wall was tender on palpation and he was given lidocaine patch. Troponin negative x2 and CXR without concerning findings.  Carotid duplex 06/13/20 with no evidence of plaque nor stenosis bilaterally. Echo 06/13/20 with LVEF 55-60%, unable to evaluate wall motion, moderate LVH, grade 1 diastolic  dysfunction, RV mildly enlarged, no significant valvular abnormalities, mild dilation of aortic root (23mm), mildly dilated pulmonary artery.   Since last seen his Bystolic dose has been increased by his primary care provider and he has also been started on Amlodipine as well as Atorvastatin.   Presents today for follow up with his wife. Tells me his BP at home 160s when sitting. It is higher when standing. Reports continued dyspnea on exertion. No lightheadedness, dizziness, near syncope, nor syncope. Tells me he has had very short episodes of seizure-like activity but was told by neurology not to worry unless they lasted greater than 5 minutes. He has follow up with neurology in February. We reviewed his recent cardiac testing in depth.   EKGs/Labs/Other Studies Reviewed:   The following studies were reviewed today:  Echo 06/13/20  1. Left ventricular ejection fraction, by estimation, is 55 to 60%. The  left ventricle has normal function. Left ventricular endocardial border  not optimally defined to evaluate regional wall motion. There is moderate  left ventricular hypertrophy. Left  ventricular diastolic parameters are consistent with Grade I diastolic  dysfunction (impaired relaxation).   2. Right ventricular systolic function is normal. The right ventricular  size is mildly enlarged. Tricuspid regurgitation signal is inadequate for  assessing PA pressure.   3. The mitral valve is normal in structure. No evidence of mitral valve  regurgitation. No evidence of mitral stenosis.   4. The aortic valve has an indeterminant number of cusps. Aortic valve  regurgitation is not visualized. No aortic stenosis is present.   5. Aortic dilatation noted. There is mild dilatation of the aortic root,  measuring 40 mm.   6. Mildly dilated pulmonary artery.   Carotid duplex 06/13/20 Summary:  Right Carotid: There was no evidence of thrombus, dissection,  atherosclerotic                plaque or  stenosis in the cervical carotid system.   Left Carotid: There was no evidence of thrombus, dissection,  atherosclerotic               plaque or stenosis in the cervical carotid system.   Vertebrals:  Bilateral vertebral arteries demonstrate antegrade flow.  Subclavians: Normal flow hemodynamics were seen in bilateral subclavian               arteries.    EKG:  EKG is  ordered today.  The ekg ordered today demonstrates NSR 63 bpm with stable TWI in lead III.   Recent Labs: 05/02/2020: ALT 16; Magnesium 2.3 06/10/2020: BUN 27; Creatinine, Ser 1.60; Hemoglobin 13.3; Platelets 204; Potassium 3.0; Sodium 137  Recent Lipid Panel    Component Value Date/Time   CHOL 235 (H) 05/31/2020 1113   TRIG 79 05/31/2020 1113   HDL 39 (L) 05/31/2020 1113   CHOLHDL 6.0 (H) 05/31/2020 1113   LDLCALC 178 (H) 05/31/2020 1113    Home Medications   Current Meds  Medication Sig  . acetaminophen (TYLENOL) 325 MG tablet Take 2 tablets (650 mg total) by mouth every 6 (six) hours as needed for mild pain, fever or headache.  Marland Kitchen amLODipine (NORVASC) 5 MG tablet Take 1 tablet (5 mg total) by mouth daily. (Patient taking differently: Take 10 mg by mouth daily. )  . aspirin EC 81 MG tablet Take 81 mg by mouth daily. Swallow whole.  Marland Kitchen atorvastatin (LIPITOR) 20 MG tablet Take 1 tablet (20 mg total) by mouth daily.  . chlorthalidone (HYGROTON) 25 MG tablet Take 1 tablet (25 mg total) by mouth daily.  Marland Kitchen ketorolac (TORADOL) 10 MG tablet Take 1 tablet (10 mg total) by mouth every 8 (eight) hours as needed for severe pain.  Marland Kitchen levETIRAcetam (KEPPRA) 500 MG tablet Take 1 tablet (500 mg total) by mouth 2 (two) times daily.  Marland Kitchen lidocaine (LIDODERM) 5 % Place 1 patch onto the skin every 12 (twelve) hours. Remove & Discard patch within 12 hours or as directed by MD  . lisinopril (ZESTRIL) 40 MG tablet Take 1 tablet (40 mg total) by mouth daily.  . polyethylene glycol (MIRALAX / GLYCOLAX) 17 g packet Take 17 g by mouth daily  as needed for moderate constipation.     Review of Systems  All other systems reviewed and are otherwise negative except as noted above.  Physical Exam    VS:  BP (!) 150/98 (BP Location: Left Arm, Patient Position: Sitting, Cuff Size: Normal)   Pulse 63   Ht 5\' 8"  (1.727 m)   Wt 261 lb (118.4 kg)   SpO2 96%   BMI 39.68 kg/m  , BMI Body mass index is 39.68 kg/m.  Wt Readings from Last 3 Encounters:  06/20/20 261 lb (118.4 kg)  06/10/20 256 lb (116.1 kg)  06/02/20 258 lb 12.8 oz (117.4 kg)    GEN: Well nourished, overweight, well developed, in no acute distress. HEENT: normal. Neck: Supple, no JVD, carotid bruits, or masses. Cardiac: RRR, no , rubs, or gallops. Gr 2/6 systolic murmur. No clubbing, cyanosis,  edema.  Radials/DP/PT 2+ and equal bilaterally.  Respiratory:  Respirations regular and unlabored, clear to auscultation bilaterally. GI: Soft, nontender, nondistended. MS: No deformity or atrophy. Skin: Warm and dry, no rash. Neuro:  Strength and sensation are intact. Psych: Normal affect.  Assessment & Plan    1. Shortness of breath, chest pain, murmur - Echo 06/13/20 LVEF 55-60%, gr1DD, moderate LVH, no significant valvular abnormalities, mildly dilated aortic root 40mm. Notes continued dyspnea on exertion. Recent ED visit 06/10/20 with chest pain which was likely musculoskeletal as HS-troponin negative x2 and chest wall tender on palpation. Due to risk factors of gender, HTN, ethnicity, family history in setting of dyspnea on exertion and chest pain - plan to proceed with ischemic evaluation with cardiac CTA.   2. Mild dilation of aortic root (30mm) by echo 05/2020 - Likely caused by uncontrolled hypertension. Plan for optimal BP control and annual monitoring. Plan for cardiac CTA, as above.    3. HTN - BP remains elevated routinely 150s-160s. Continue Amlodipine 10mg  daily, Chlorthalidone 25mg  daily, Lisinopril 40mg  daily, Nebivolol 20mg  BID. Start Hydralazine 25mg  BID.  BMP today for monitoring. If BP remains uncontrolled may need to consider renal duplex for exclusion of secondary hypertension.    4. OSA - Working with DME to get CPAP. Discussed importance of CPAP in context of HTN. Sleep has improved some since improved BP control.  5. Seizure disorder - Continue to follow with neurology.  6. Morbid obesity - Weight loss via diet and exercise encouraged. Discussed focusing on dietary changes as his back pain makes exercise difficult. Discussed implications of weight on HTN.  7. Hypokalemia - Recent ED visit with K 3.0, given PO repletion. Repeat BMP today.   Disposition: Plan for cardiac CTA. Follow up in 6 week(s) with Dr. Saunders Revel or APP   Signed, Loel Dubonnet, NP 06/20/2020, 11:14 AM [place]

## 2020-06-20 ENCOUNTER — Other Ambulatory Visit: Payer: Self-pay

## 2020-06-20 ENCOUNTER — Encounter: Payer: Self-pay | Admitting: Family

## 2020-06-20 ENCOUNTER — Ambulatory Visit (INDEPENDENT_AMBULATORY_CARE_PROVIDER_SITE_OTHER): Payer: Commercial Managed Care - PPO | Admitting: Family

## 2020-06-20 ENCOUNTER — Other Ambulatory Visit: Payer: Self-pay | Admitting: *Deleted

## 2020-06-20 VITALS — BP 150/98 | HR 63 | Ht 68.0 in | Wt 261.0 lb

## 2020-06-20 DIAGNOSIS — R0602 Shortness of breath: Secondary | ICD-10-CM

## 2020-06-20 DIAGNOSIS — I77810 Thoracic aortic ectasia: Secondary | ICD-10-CM

## 2020-06-20 DIAGNOSIS — I10 Essential (primary) hypertension: Secondary | ICD-10-CM | POA: Diagnosis not present

## 2020-06-20 DIAGNOSIS — R072 Precordial pain: Secondary | ICD-10-CM

## 2020-06-20 DIAGNOSIS — G40909 Epilepsy, unspecified, not intractable, without status epilepticus: Secondary | ICD-10-CM

## 2020-06-20 DIAGNOSIS — I69398 Other sequelae of cerebral infarction: Secondary | ICD-10-CM

## 2020-06-20 MED ORDER — METOPROLOL TARTRATE 100 MG PO TABS: 100.0000 mg | ORAL_TABLET | Freq: Once | ORAL | 0 refills | Status: DC

## 2020-06-20 MED ORDER — LEVETIRACETAM 500 MG PO TABS
500.0000 mg | ORAL_TABLET | Freq: Two times a day (BID) | ORAL | 0 refills | Status: DC
Start: 2020-06-20 — End: 2020-07-28

## 2020-06-20 MED ORDER — HYDRALAZINE HCL 25 MG PO TABS: 25.0000 mg | ORAL_TABLET | Freq: Two times a day (BID) | ORAL | 1 refills | Status: DC

## 2020-06-20 NOTE — Patient Instructions (Addendum)
Medication Instructions:  Your physician has recommended you make the following change in your medication:   START Hydralazine 39m twice daily  *If you need a refill on your cardiac medications before your next appointment, please call your pharmacy*  Lab Work: Your provider recommends that you return for lab work today: BMP  If you have labs (blood work) drawn today and your tests are completely normal, you will receive your results only by: .Marland KitchenMyChart Message (if you have MyChart) OR . A paper copy in the mail If you have any lab test that is abnormal or we need to change your treatment, we will call you to review the results.  Testing/Procedures: Your EKG today was stable compared to previous - it showed normal sinus rhythm.   Your physician has requested that you have cardiac CT. Cardiac computed tomography (CT) is a painless test that uses an x-ray machine to take clear, detailed pictures of your heart. Please follow instruction sheet as given.  Your cardiac CT will be scheduled at one of the below locations:   [facility] [place] [phone] [place] [phone] [facility] please [facility] minutes prior to test start time. Pro[facility] the [facility] (first floor) to check-in and test prep.  [facility] please arrive 15 mins early for check-in and test prep.  Please follow these instructions carefully (unless otherwise directed):  Hold all erectile dysfunction medications at least 3 days (72 hrs) prior to test.  On the Night Before the Test: . Be sure to Drink plenty of water. . Do not consume any caffeinated/decaffeinated beverages or chocolate 12 hours prior to your test. . Do  not take any antihistamines 12 hours prior to your test.  On the Day of the Test: . Drink plenty of water. Do not drink any water within one hour of the test. . Do not eat any food 4 hours prior to the test. . You may take your regular medications prior to the test.  . Take metoprolol (Lopressor) two hours prior to test. . HOLD Chlorthalidone morning of the test.        After the Test: . Drink plenty of water. . After receiving IV contrast, you may experience a mild flushed feeling. This is normal. . On occasion, you may experience a mild rash up to 24 hours after the test. This is not dangerous. If this occurs, you can take Benadryl 25 mg and increase your fluid intake. . If you experience trouble breathing, this can be serious. If it is severe call 911 IMMEDIATELY. If it is mild, please call our office.   Once we have confirmed authorization from your insurance company, we will call you to set up a date and time for your test. Based on how quickly your insurance processes prior authorizations requests, please allow up to 4 weeks to be contacted for scheduling your Cardiac CT appointment. Be advised that routine Cardiac CT appointments could be scheduled as many as 8 weeks after your provider has ordered it.  For non-scheduling related questions, please contact the cardiac imaging nurse navigator should you have any questions/concerns: SMarchia Bond Cardiac Imaging Nurse Navigator MBurley Saver Interim Cardiac Imaging Nurse Navigator [facility] Direct Office  Dial: [phone]   For scheduling needs, including cancellations and rescheduling, please call [place], [phone].    Follow-Up: At [facility], you and your health needs are our priority.  As part of our continuing mission to provide you with exceptional heart care, we have created designated Provider Care Teams.  These Care Teams include your primary Cardiologist (physician) and Advanced  Practice Providers (APPs -  Physician Assistants and Nurse Practitioners) who all work together to provide you with the care you need, when you need it.  We recommend signing up for the patient portal called "MyChart".  Sign up information is provided on this After Visit Summary.  MyChart is used to connect with patients for Virtual Visits (Telemedicine).  Patients are able to view lab/test results, encounter notes, upcoming appointments, etc.  Non-urgent messages can be sent to your provider as well.   To learn more about what you can do with MyChart, go to [email or web address].    Your next appointment:   6 week(s)  The format for your next appointment:   In Person  Provider:   You may see Nelva Bush, MD or one of the following Advanced Practice Providers on your designated Care Team:    Murray Hodgkins, NP  Christell Faith, PA-C  Marrianne Mood, PA-C  Cadence Wallace, [place]  Laurann Montana, NP  Other Instructions

## 2020-06-21 LAB — BASIC METABOLIC PANEL
BUN/Creatinine Ratio: 15 (ref 9–20)
BUN: 17 mg/dL (ref 6–24)
CO2: 28 mmol/L (ref 20–29)
Calcium: 9.3 mg/dL (ref 8.7–10.2)
Chloride: 102 mmol/L (ref 96–106)
Creatinine, Ser: 1.17 mg/dL (ref 0.76–1.27)
GFR calc Af Amer: 82 mL/min/{1.73_m2} (ref >59)
GFR calc non Af Amer: 71 mL/min/{1.73_m2} (ref >59)
Glucose: 99 mg/dL (ref 65–99)
Potassium: 3.5 mmol/L (ref 3.5–5.2)
Sodium: 141 mmol/L (ref 134–144)

## 2020-06-26 ENCOUNTER — Other Ambulatory Visit: Payer: Self-pay

## 2020-06-26 MED ORDER — TRAMADOL HCL 50 MG PO TABS
50.0000 mg | ORAL_TABLET | Freq: Three times a day (TID) | ORAL | 0 refills | Status: AC | PRN
Start: 2020-06-26 — End: 2020-07-01

## 2020-06-30 ENCOUNTER — Other Ambulatory Visit: Payer: Self-pay

## 2020-06-30 ENCOUNTER — Encounter: Payer: Self-pay | Admitting: Family Medicine

## 2020-06-30 ENCOUNTER — Ambulatory Visit (INDEPENDENT_AMBULATORY_CARE_PROVIDER_SITE_OTHER): Payer: Commercial Managed Care - PPO | Admitting: Family Medicine

## 2020-06-30 VITALS — BP 156/95 | HR 68 | Ht 68.0 in | Wt 259.1 lb

## 2020-06-30 DIAGNOSIS — I10 Essential (primary) hypertension: Secondary | ICD-10-CM | POA: Diagnosis not present

## 2020-06-30 DIAGNOSIS — G4733 Obstructive sleep apnea (adult) (pediatric): Secondary | ICD-10-CM

## 2020-06-30 MED ORDER — ISOSORBIDE MONONITRATE ER 30 MG PO TB24: 30.0000 mg | ORAL_TABLET | Freq: Every day | ORAL | 6 refills | Status: DC

## 2020-06-30 MED ORDER — AMLODIPINE BESYLATE 5 MG PO TABS: 10.0000 mg | ORAL_TABLET | Freq: Every day | ORAL | 2 refills | Status: DC

## 2020-06-30 NOTE — Assessment & Plan Note (Addendum)
Not at goal today, Systolic has improved but diastolic is close to 910. Taking all meds, full medication reconciliation done today after patient returned home with his meds. Plan- Starting Isosorbide Mononitrate.

## 2020-06-30 NOTE — Assessment & Plan Note (Signed)
Patient is not willing to use CPAP, after last visit we had respiratory reach out to him but they told me today that they think he will be better after he loses weight. Plan- Continue to encourage patient and remind him of cardiac effects of untreated OSA.

## 2020-06-30 NOTE — Progress Notes (Signed)
Established Patient Office Visit  SUBJECTIVE:  Subjective  Patient ID: Jake Kirby, male    DOB: 10-Mar-1966  Age: 54 y.o. MRN: [id number]  CC:  Chief Complaint  Patient presents with   Hypertension    HPI Jake Kirby is a 54 y.o. male presenting today for     Past Medical History:  Diagnosis Date   Hypertension    Seizure ([place])    Stroke [facility])     Past Surgical History:  Procedure Laterality Date   BACK SURGERY     COLONOSCOPY WITH PROPOFOL N/A 02/12/2019   Procedure: COLONOSCOPY WITH PROPOFOL;  Surgeon: Virgel Manifold, MD;  Location: [facility] ENDOSCOPY;  Service: Endoscopy;  Laterality: N/A;    Family History  Problem Relation Age of Onset   Hypertension Mother    Diabetes Mother    Heart attack Father 72    Social History   Socioeconomic History   Marital status: Married    Spouse name: Not on file   Number of children: Not on file   Years of education: Not on file   Highest education level: Not on file  Occupational History   Not on file  Tobacco Use   Smoking status: Current Some Day Smoker    Years: 25.00    Types: Cigarettes   Smokeless tobacco: Never Used   Tobacco comment: social smoker when having a beer.   Vaping Use   Vaping Use: Never used  Substance and Sexual Activity   Alcohol use: Yes    Alcohol/week: 5.0 standard drinks    Types: 5 Cans of beer per week   Drug use: No   Sexual activity: Not on file  Other Topics Concern   Not on file  Social History Narrative   Not on file   Social Determinants of Health   Financial Resource Strain: Not on file  Food Insecurity: Not on file  Transportation Needs: Not on file  Physical Activity: Not on file  Stress: Not on file  Social Connections: Not on file  Intimate Partner Violence: Not on file     Current Outpatient Medications:    acetaminophen (TYLENOL) 325 MG tablet, Take 2 tablets (650 mg total) by mouth every 6 (six) hours as needed for mild pain,  fever or headache., Disp: 20 tablet, Rfl: 0   aspirin EC 81 MG tablet, Take 81 mg by mouth daily. Swallow whole., Disp: , Rfl:    atorvastatin (LIPITOR) 20 MG tablet, Take 1 tablet (20 mg total) by mouth daily., Disp: 90 tablet, Rfl: 3   chlorthalidone (HYGROTON) 25 MG tablet, Take 1 tablet (25 mg total) by mouth daily., Disp: 30 tablet, Rfl: 3   hydrALAZINE (APRESOLINE) 25 MG tablet, Take 1 tablet (25 mg total) by mouth in the morning and at bedtime., Disp: 180 tablet, Rfl: 1   ketorolac (TORADOL) 10 MG tablet, Take 1 tablet (10 mg total) by mouth every 8 (eight) hours as needed for severe pain., Disp: 20 tablet, Rfl: 0   levETIRAcetam (KEPPRA) 500 MG tablet, Take 1 tablet (500 mg total) by mouth 2 (two) times daily., Disp: 60 tablet, Rfl: 0   lidocaine (LIDODERM) 5 %, Place 1 patch onto the skin every 12 (twelve) hours. Remove & Discard patch within 12 hours or as directed by MD, Disp: 10 patch, Rfl: 0   lisinopril (ZESTRIL) 40 MG tablet, Take 1 tablet (40 mg total) by mouth daily., Disp: 30 tablet, Rfl: 3   polyethylene glycol (MIRALAX / GLYCOLAX)  17 g packet, Take 17 g by mouth daily as needed for moderate constipation., Disp: 14 each, Rfl: 0   traMADol (ULTRAM) 50 MG tablet, Take 1 tablet (50 mg total) by mouth every 8 (eight) hours as needed for up to 5 days., Disp: 15 tablet, Rfl: 0   amLODipine (NORVASC) 5 MG tablet, Take 2 tablets (10 mg total) by mouth daily., Disp: 90 tablet, Rfl: 2   isosorbide mononitrate (IMDUR) 30 MG 24 hr tablet, Take 1 tablet (30 mg total) by mouth daily., Disp: 30 tablet, Rfl: 6   metoprolol tartrate (LOPRESSOR) 100 MG tablet, Take 1 tablet (100 mg total) by mouth once for 1 dose. Take TWO hours prior to CT procedure, Disp: 1 tablet, Rfl: 0   nebivolol 20 MG TABS, Take 1 tablet (20 mg total) by mouth 2 (two) times daily., Disp: 60 tablet, Rfl: 2   No Known Allergies  ROS Review of Systems  Constitutional: Negative.   HENT: Negative.    Respiratory: Negative.   Cardiovascular: Negative.   Gastrointestinal: Negative.   Musculoskeletal: Negative.   Skin: Negative.   Neurological: Negative.   Psychiatric/Behavioral: Negative.      OBJECTIVE:    Physical Exam Constitutional:      Appearance: Normal appearance. He is obese.  HENT:     Right Ear: Tympanic membrane normal.     Left Ear: Tympanic membrane normal.     Mouth/Throat:     Mouth: Mucous membranes are dry.  Cardiovascular:     Rate and Rhythm: Normal rate and regular rhythm.  Pulmonary:     Effort: Pulmonary effort is normal.     Breath sounds: Normal breath sounds.  Musculoskeletal:        General: Normal range of motion.     Cervical back: Normal range of motion.  Skin:    General: Skin is warm.  Neurological:     Mental Status: He is alert and oriented to person, place, and time.     BP (!) 156/95    Pulse 68    Ht 5\' 8"  (1.727 m)    Wt 259 lb 1.6 oz (117.5 kg)    BMI 39.40 kg/m  Wt Readings from Last 3 Encounters:  06/30/20 259 lb 1.6 oz (117.5 kg)  06/20/20 261 lb (118.4 kg)  06/10/20 256 lb (116.1 kg)    Health Maintenance Due  Topic Date Due   Hepatitis C Screening  Never done   COVID-19 Vaccine (1) Never done   TETANUS/TDAP  Never done    There are no preventive care reminders to display for this patient.  CBC Latest Ref Rng & Units 06/10/2020 05/02/2020 05/01/2020  WBC 4.0 - 10.5 K/uL 7.3 8.0 8.7  Hemoglobin 13.0 - 17.0 g/dL 13.3 13.0 13.2  Hematocrit 39.0 - 52.0 % 42.2 41.7 42.5  Platelets 150 - 400 K/uL 204 226 230   CMP Latest Ref Rng & Units 06/20/2020 06/10/2020 05/02/2020  Glucose 65 - 99 mg/dL 99 117(H) 109(H)  BUN 6 - 24 mg/dL 17 27(H) 12  Creatinine 0.76 - 1.27 mg/dL 1.17 1.60(H) 1.14  Sodium 134 - 144 mmol/L 141 137 139  Potassium 3.5 - 5.2 mmol/L 3.5 3.0(L) 3.6  Chloride 96 - 106 mmol/L 102 99 103  CO2 20 - 29 mmol/L 28 23 27   Calcium 8.7 - 10.2 mg/dL 9.3 9.0 8.8(L)  Total Protein 6.5 - 8.1 g/dL - - 6.8   Total Bilirubin 0.3 - 1.2 mg/dL - - 0.2(L)  Alkaline Phos 38 -  126 U/L - - 58  AST 15 - 41 U/L - - 13(L)  ALT 0 - 44 U/L - - 16    No results found for: TSH Lab Results  Component Value Date   ALBUMIN 3.2 (L) 05/02/2020   ANIONGAP 15 06/10/2020   Lab Results  Component Value Date   CHOL 235 (H) 05/31/2020   CHOL 263 (H) 05/19/2020   HDL 39 (L) 05/31/2020   HDL 42 05/19/2020   LDLCALC 178 (H) 05/31/2020   LDLCALC 201 (H) 05/19/2020   CHOLHDL 6.0 (H) 05/31/2020   CHOLHDL 6.3 (H) 05/19/2020   Lab Results  Component Value Date   TRIG 79 05/31/2020   Lab Results  Component Value Date   HGBA1C 5.8 (H) 04/29/2020      ASSESSMENT & PLAN:   Problem List Items Addressed This Visit      Cardiovascular and Mediastinum   Essential hypertension - Primary    Not at goal today, Systolic has improved but diastolic is close to 361. Taking all meds, full medication reconciliation done today after patient returned home with his meds. Plan- Starting Isosorbide Mononitrate.       Relevant Medications   amLODipine (NORVASC) 5 MG tablet   isosorbide mononitrate (IMDUR) 30 MG 24 hr tablet     Respiratory   Sleep apnea, obstructive    Patient is not willing to use CPAP, after last visit we had respiratory reach out to him but they told me today that they think he will be better after he loses weight. Plan- Continue to encourage patient and remind him of cardiac effects of untreated OSA.          Meds ordered this encounter  Medications   amLODipine (NORVASC) 5 MG tablet    Sig: Take 2 tablets (10 mg total) by mouth daily.    Dispense:  90 tablet    Refill:  2   isosorbide mononitrate (IMDUR) 30 MG 24 hr tablet    Sig: Take 1 tablet (30 mg total) by mouth daily.    Dispense:  30 tablet    Refill:  6      Follow-up: No follow-ups on file.    Beckie Salts, [place] [address], [place], [place] [address]

## 2020-07-10 ENCOUNTER — Other Ambulatory Visit: Payer: Self-pay

## 2020-07-10 NOTE — Addendum Note (Signed)
Addended by: Anson [place] R on: 07/10/2020 12:42 PM   Modules accepted: Orders

## 2020-07-19 ENCOUNTER — Other Ambulatory Visit (HOSPITAL_COMMUNITY): Payer: Self-pay | Admitting: Emergency Medicine

## 2020-07-19 DIAGNOSIS — I712 Thoracic aortic aneurysm, without rupture, unspecified: Secondary | ICD-10-CM

## 2020-07-19 NOTE — Progress Notes (Signed)
Added CTA chest aorta to eval know aortic root dilatation; will scan at time of cardiac CTA  Rockwell Alexandria RN Navigator Cardiac Imaging [facility] [phone] Office  [phone] Cell

## 2020-07-20 ENCOUNTER — Other Ambulatory Visit: Payer: Self-pay

## 2020-07-20 ENCOUNTER — Ambulatory Visit
Admission: RE | Admit: 2020-07-20 | Discharge: 2020-07-20 | Disposition: A | Discharge status: Home / Self Care | Payer: Commercial Managed Care - PPO | Source: Ambulatory Visit | Attending: Family | Admitting: Family

## 2020-07-20 DIAGNOSIS — R072 Precordial pain: Secondary | ICD-10-CM | POA: Insufficient documentation

## 2020-07-20 DIAGNOSIS — I712 Thoracic aortic aneurysm, without rupture, unspecified: Secondary | ICD-10-CM

## 2020-07-20 MED ORDER — METOPROLOL TARTRATE 5 MG/5ML IV SOLN
10.0000 mg | Freq: Once | INTRAVENOUS | Status: AC
  Administered 2020-07-20: 10 mg via INTRAVENOUS

## 2020-07-20 MED ORDER — IOHEXOL 350 MG/ML SOLN
150.0000 mL | Freq: Once | INTRAVENOUS | Status: AC | PRN
  Administered 2020-07-20: 15:00:00 150 mL via INTRAVENOUS

## 2020-07-20 MED ORDER — NITROGLYCERIN 0.4 MG SL SUBL
0.8000 mg | SUBLINGUAL_TABLET | Freq: Once | SUBLINGUAL | Status: AC
  Administered 2020-07-20: 15:00:00 0.8 mg via SUBLINGUAL

## 2020-07-20 NOTE — Progress Notes (Signed)
Patient tolerated CT well. Drank water after and gave one liter of water to take with and drink. Vital signs stable encourage to drink water throughout day.Reasons explained and verbalized understanding. Ambulated steady gait.

## 2020-07-25 ENCOUNTER — Telehealth: Payer: Self-pay | Admitting: *Deleted

## 2020-07-25 NOTE — Telephone Encounter (Signed)
-----   Message from Alver Sorrow, NP sent at 07/25/2020  4:32 PM EST ----- Received result of CT chest (detailed study of aortic dilation). Ascending thoracic aorta measure 4.2 cm which is recommended for annual imaging for monitoring. Will discuss at upcoming follow up. Notation of hepatic steatosis also called "fatty liver" - recommend avoiding fried foods and alcohol intake. Notation again made for lung nodule with recommended repeat CT as previously recommended. Forwarded to PCP for his review. Will discuss in detail at upcoming visit. Overall stable result!

## 2020-07-25 NOTE — Telephone Encounter (Signed)
Attempted to call pt to review results below. No answer. Lmtcb.

## 2020-07-27 NOTE — Telephone Encounter (Signed)
Spoke to pt, states that he has been notified of cardiac CT results and has no further questions at this time. Pt will follow up as scheduled with Gillian Shields, NP 08/01/20.

## 2020-07-28 ENCOUNTER — Ambulatory Visit: Payer: Commercial Managed Care - PPO | Admitting: Diagnostic Neuroimaging

## 2020-07-28 ENCOUNTER — Other Ambulatory Visit: Payer: Self-pay

## 2020-07-28 ENCOUNTER — Encounter: Payer: Self-pay | Admitting: Diagnostic Neuroimaging

## 2020-07-28 ENCOUNTER — Ambulatory Visit (INDEPENDENT_AMBULATORY_CARE_PROVIDER_SITE_OTHER): Payer: Commercial Managed Care - PPO | Admitting: Family Medicine

## 2020-07-28 ENCOUNTER — Encounter: Payer: Self-pay | Admitting: Internal Medicine

## 2020-07-28 VITALS — BP 147/93 | HR 63 | Ht 68.0 in | Wt 267.0 lb

## 2020-07-28 VITALS — BP 163/94 | HR 63 | Ht 68.0 in | Wt 266.8 lb

## 2020-07-28 DIAGNOSIS — M5442 Lumbago with sciatica, left side: Secondary | ICD-10-CM | POA: Diagnosis not present

## 2020-07-28 DIAGNOSIS — G40909 Epilepsy, unspecified, not intractable, without status epilepticus: Secondary | ICD-10-CM

## 2020-07-28 DIAGNOSIS — M5416 Radiculopathy, lumbar region: Secondary | ICD-10-CM | POA: Diagnosis not present

## 2020-07-28 DIAGNOSIS — I10 Essential (primary) hypertension: Secondary | ICD-10-CM

## 2020-07-28 DIAGNOSIS — M5441 Lumbago with sciatica, right side: Secondary | ICD-10-CM

## 2020-07-28 DIAGNOSIS — Z23 Encounter for immunization: Secondary | ICD-10-CM

## 2020-07-28 DIAGNOSIS — G8929 Other chronic pain: Secondary | ICD-10-CM

## 2020-07-28 MED ORDER — LEVETIRACETAM 500 MG PO TABS
500.0000 mg | ORAL_TABLET | Freq: Two times a day (BID) | ORAL | 4 refills | Status: DC
Start: 2020-07-28 — End: 2021-07-30

## 2020-07-28 NOTE — Assessment & Plan Note (Signed)
  HTN not quite at goal but better than it has been, his BP was 127/90 at his Cardiologist last week.

## 2020-07-28 NOTE — Patient Instructions (Signed)
  SEIZURE DISORDER  - restart levetiracetam 500mg  twice a day   - According to [place] law, you can not drive unless you are seizure / syncope free for at least 6 months and under physician's care.   - Please maintain precautions. Do not participate in activities where a loss of awareness could harm you or someone else. No swimming alone, no tub bathing, no hot tubs, no driving, no operating motorized vehicles (cars, ATVs, motocycles, etc), lawnmowers, power tools or firearms. No standing at heights, such as rooftops, ladders or stairs. Avoid hot objects such as stoves, heaters, open fires. Wear a helmet when riding a bicycle, scooter, skateboard, etc. and avoid areas of traffic. Set your water heater to 120 degrees or less.

## 2020-07-28 NOTE — Assessment & Plan Note (Signed)
Lower extremity pain and weakness with neuropathy since a new onset grandma seizure. His pain and numbness is in both legs. He has been out of work since onset.  Plan- He has a Neurology appt today but considering his radiculopathy is not improving It is time to refer to Neuro Surgery in [place]. Patient is agreeable with the plan.

## 2020-07-28 NOTE — Addendum Note (Signed)
Addended by: Anson [place] R on: 07/28/2020 09:54 AM   Modules accepted: Orders

## 2020-07-28 NOTE — Addendum Note (Signed)
Addended by: Anson [place] R on: 07/28/2020 09:46 AM   Modules accepted: Orders

## 2020-07-28 NOTE — Progress Notes (Signed)
Established Patient Office Visit  SUBJECTIVE:  Subjective  Patient ID: Jake Kirby, male    DOB: [birth year]  Age: 55 y.o. MRN: [id number]  CC:  Chief Complaint  Patient presents with  . Follow-up    HPI OLGA SEYLER is a 55 y.o. male presenting today for   Fu with lower extremity radiculopathy.   Past Medical History:  Diagnosis Date  . Hypertension   . Seizure ([place])   . Stroke [facility])     Past Surgical History:  Procedure Laterality Date  . BACK SURGERY    . COLONOSCOPY WITH PROPOFOL N/A 02/12/2019   Procedure: COLONOSCOPY WITH PROPOFOL;  Surgeon: Virgel Manifold, MD;  Location: [facility] ENDOSCOPY;  Service: Endoscopy;  Laterality: N/A;    Family History  Problem Relation Age of Onset  . Hypertension Mother   . Diabetes Mother   . Heart attack Father 7    Social History   Socioeconomic History  . Marital status: Married    Spouse name: Not on file  . Number of children: Not on file  . Years of education: Not on file  . Highest education level: Not on file  Occupational History  . Not on file  Tobacco Use  . Smoking status: Light Tobacco Smoker    Types: Cigarettes  . Smokeless tobacco: Never Used  . Tobacco comment: social smoker when having a beer.   Vaping Use  . Vaping Use: Never used  Substance and Sexual Activity  . Alcohol use: Yes    Alcohol/week: 5.0 standard drinks    Types: 5 Cans of beer per week  . Drug use: No  . Sexual activity: Not on file  Other Topics Concern  . Not on file  Social History Narrative  . Not on file   Social Determinants of Health   Financial Resource Strain: Not on file  Food Insecurity: Not on file  Transportation Needs: Not on file  Physical Activity: Not on file  Stress: Not on file  Social Connections: Not on file  Intimate Partner Violence: Not on file     Current Outpatient Medications:  .  acetaminophen (TYLENOL) 325 MG tablet, Take 2 tablets (650 mg total) by mouth every 6 (six) hours as  needed for mild pain, fever or headache., Disp: 20 tablet, Rfl: 0 .  amLODipine (NORVASC) 5 MG tablet, Take 2 tablets (10 mg total) by mouth daily., Disp: 90 tablet, Rfl: 2 .  aspirin EC 81 MG tablet, Take 81 mg by mouth daily. Swallow whole., Disp: , Rfl:  .  atorvastatin (LIPITOR) 20 MG tablet, Take 1 tablet (20 mg total) by mouth daily., Disp: 90 tablet, Rfl: 3 .  hydrALAZINE (APRESOLINE) 25 MG tablet, Take 1 tablet (25 mg total) by mouth in the morning and at bedtime., Disp: 180 tablet, Rfl: 1 .  isosorbide mononitrate (IMDUR) 30 MG 24 hr tablet, Take 1 tablet (30 mg total) by mouth daily., Disp: 30 tablet, Rfl: 6 .  ketorolac (TORADOL) 10 MG tablet, Take 1 tablet (10 mg total) by mouth every 8 (eight) hours as needed for severe pain., Disp: 20 tablet, Rfl: 0 .  levETIRAcetam (KEPPRA) 500 MG tablet, Take 1 tablet (500 mg total) by mouth 2 (two) times daily. (Patient not taking: Reported on 07/10/2020), Disp: 60 tablet, Rfl: 0 .  lidocaine (LIDODERM) 5 %, Place 1 patch onto the skin every 12 (twelve) hours. Remove & Discard patch within 12 hours or as directed by MD, Disp: 10 patch, Rfl:  0 .  lisinopril (ZESTRIL) 40 MG tablet, Take 1 tablet (40 mg total) by mouth daily., Disp: 30 tablet, Rfl: 3 .  metoprolol tartrate (LOPRESSOR) 100 MG tablet, Take 1 tablet (100 mg total) by mouth once for 1 dose. Take TWO hours prior to CT procedure, Disp: 1 tablet, Rfl: 0 .  nebivolol 20 MG TABS, Take 1 tablet (20 mg total) by mouth 2 (two) times daily., Disp: 60 tablet, Rfl: 2   No Known Allergies  ROS Review of Systems  Constitutional: Negative.   HENT: Negative.   Respiratory: Negative.   Cardiovascular: Negative.   Musculoskeletal: Positive for myalgias. Negative for back pain (with radiculopathy down both legs. ).  Neurological: Positive for weakness.  Psychiatric/Behavioral: Negative.      OBJECTIVE:    Physical Exam Vitals and nursing note reviewed.  Constitutional:      Appearance: Normal  appearance. He is obese.  HENT:     Head: Normocephalic.     Mouth/Throat:     Mouth: Mucous membranes are moist.  Cardiovascular:     Rate and Rhythm: Normal rate and regular rhythm.  Musculoskeletal:        General: Normal range of motion.  Neurological:     Mental Status: He is alert.  Psychiatric:        Mood and Affect: Mood normal.     BP (!) 163/94   Pulse 63   Ht 5\' 8"  (1.727 m)   Wt 266 lb 12.8 oz (121 kg)   BMI 40.57 kg/m  Wt Readings from Last 3 Encounters:  07/28/20 266 lb 12.8 oz (121 kg)  06/30/20 259 lb 1.6 oz (117.5 kg)  06/20/20 261 lb (118.4 kg)    Health Maintenance Due  Topic Date Due  . Hepatitis C Screening  Never done  . COVID-19 Vaccine (1) Never done  . TETANUS/TDAP  Never done    There are no preventive care reminders to display for this patient.  CBC Latest Ref Rng & Units 06/10/2020 05/02/2020 05/01/2020  WBC 4.0 - 10.5 K/uL 7.3 8.0 8.7  Hemoglobin 13.0 - 17.0 g/dL 13.3 13.0 13.2  Hematocrit 39.0 - 52.0 % 42.2 41.7 42.5  Platelets 150 - 400 K/uL 204 226 230   CMP Latest Ref Rng & Units 06/20/2020 06/10/2020 05/02/2020  Glucose 65 - 99 mg/dL 99 117(H) 109(H)  BUN 6 - 24 mg/dL 17 27(H) 12  Creatinine 0.76 - 1.27 mg/dL 1.17 1.60(H) 1.14  Sodium 134 - 144 mmol/L 141 137 139  Potassium 3.5 - 5.2 mmol/L 3.5 3.0(L) 3.6  Chloride 96 - 106 mmol/L 102 99 103  CO2 20 - 29 mmol/L 28 23 27   Calcium 8.7 - 10.2 mg/dL 9.3 9.0 8.8(L)  Total Protein 6.5 - 8.1 g/dL - - 6.8  Total Bilirubin 0.3 - 1.2 mg/dL - - 0.2(L)  Alkaline Phos 38 - 126 U/L - - 58  AST 15 - 41 U/L - - 13(L)  ALT 0 - 44 U/L - - 16    No results found for: TSH Lab Results  Component Value Date   ALBUMIN 3.2 (L) 05/02/2020   ANIONGAP 15 06/10/2020   Lab Results  Component Value Date   CHOL 235 (H) 05/31/2020   CHOL 263 (H) 05/19/2020   HDL 39 (L) 05/31/2020   HDL 42 05/19/2020   LDLCALC 178 (H) 05/31/2020   LDLCALC 201 (H) 05/19/2020   CHOLHDL 6.0 (H) 05/31/2020    CHOLHDL 6.3 (H) 05/19/2020   Lab Results  Component Value Date   TRIG 79 05/31/2020   Lab Results  Component Value Date   HGBA1C 5.8 (H) 04/29/2020      ASSESSMENT & PLAN:   Problem List Items Addressed This Visit      Cardiovascular and Mediastinum   Essential hypertension - Primary       HTN not quite at goal but better than it has been, his BP was 127/90 at his Cardiologist last week.          Nervous and Auditory   Lumbar radiculopathy   Chronic bilateral low back pain with bilateral sciatica    Lower extremity pain and weakness with neuropathy since a new onset grandma seizure. His pain and numbness is in both legs. He has been out of work since onset.  Plan- He has a Neurology appt today but considering his radiculopathy is not improving It is time to refer to Neuro Surgery in [place]. Patient is agreeable with the plan.            No orders of the defined types were placed in this encounter.     Follow-up: No follow-ups on file.    Beckie Salts, [place] [address], [place], [place] [address]

## 2020-07-28 NOTE — Progress Notes (Signed)
[facility]  PATIENT: Jake Kirby DOB: 1965/08/11  REFERRING CLINICIAN: Cletis Athens, MD HISTORY FROM: patient  REASON FOR VISIT: new consult    HISTORICAL  CHIEF COMPLAINT:  Chief Complaint  Patient presents with  . New Patient (Initial Visit)    Room 7. He is here with his wife, Alwyn Ren. Referred for seizures.History of CVA. First episode in October 2020. He was started on Keppra 500mg , one tablet BID at the hospital.  He ran out of the medication in November 2020. He has continued to have seizures.     HISTORY OF PRESENT ILLNESS:   55 year old male here for evaluation of seizures.  History of hypertension, tobacco abuse, obstructive sleep apnea.  On 10/7/1 patient was at home and not feeling well, had multiple generalized seizures witnessed by family.  Patient brought to the hospital by ambulance.  He was transferred from [place] [facility] to [facility] for long-term EEG monitoring.  Multiple EEGs were performed which showed no epileptiform discharges.  Patient was felt to have combination of epileptic and nonepileptic spells.  He was discharged on levetiracetam 5 mg twice a day.  Patient completed this dosing through the end of November and then ran out of medication.  Since that time has had intermittent episodes of staring spells, jittery shaking sensation, amnesia.  Episodes can last 1 or 2 minutes at a time.  Afterwards he feels tired.    REVIEW OF SYSTEMS: Full 14 system review of systems performed and negative with exception of: As per HPI.  ALLERGIES: No Known Allergies  HOME MEDICATIONS: Outpatient Medications Prior to Visit  Medication Sig Dispense Refill  . acetaminophen (TYLENOL) 325 MG tablet Take 2 tablets (650 mg total) by mouth every 6 (six) hours as needed for mild pain, fever or headache. 20 tablet 0  . amLODipine (NORVASC) 5 MG tablet Take 2 tablets (10 mg total) by mouth daily. 90 tablet 2  . aspirin EC 81 MG tablet Take 81 mg  by mouth daily. Swallow whole.    Marland Kitchen atorvastatin (LIPITOR) 20 MG tablet Take 1 tablet (20 mg total) by mouth daily. 90 tablet 3  . hydrALAZINE (APRESOLINE) 25 MG tablet Take 1 tablet (25 mg total) by mouth in the morning and at bedtime. 180 tablet 1  . isosorbide mononitrate (IMDUR) 30 MG 24 hr tablet Take 1 tablet (30 mg total) by mouth daily. 30 tablet 6  . ketorolac (TORADOL) 10 MG tablet Take 1 tablet (10 mg total) by mouth every 8 (eight) hours as needed for severe pain. 20 tablet 0  . levETIRAcetam (KEPPRA) 500 MG tablet Take 1 tablet (500 mg total) by mouth 2 (two) times daily. 60 tablet 0  . lidocaine (LIDODERM) 5 % Place 1 patch onto the skin every 12 (twelve) hours. Remove & Discard patch within 12 hours or as directed by MD 10 patch 0  . lisinopril (ZESTRIL) 40 MG tablet Take 1 tablet (40 mg total) by mouth daily. 30 tablet 3  . metoprolol tartrate (LOPRESSOR) 100 MG tablet Take 1 tablet (100 mg total) by mouth once for 1 dose. Take TWO hours prior to CT procedure 1 tablet 0  . nebivolol 20 MG TABS Take 1 tablet (20 mg total) by mouth 2 (two) times daily. 60 tablet 2   No facility-administered medications prior to visit.    PAST MEDICAL HISTORY: Past Medical History:  Diagnosis Date  . Hypertension   . Seizure ([place])   . Stroke [facility])  PAST SURGICAL HISTORY: Past Surgical History:  Procedure Laterality Date  . BACK SURGERY    . COLONOSCOPY WITH PROPOFOL N/A 02/12/2019   Procedure: COLONOSCOPY WITH PROPOFOL;  Surgeon: Virgel Manifold, MD;  Location: [facility] ENDOSCOPY;  Service: Endoscopy;  Laterality: N/A;    FAMILY HISTORY: Family History  Problem Relation Age of Onset  . Hypertension Mother   . Diabetes Mother   . Heart attack Father 16    SOCIAL HISTORY: Social History   Socioeconomic History  . Marital status: Married    Spouse name: Not on file  . Number of children: 3  . Years of education: some college  . Highest education level: Not on file   Occupational History  . Occupation: Firefighter  Tobacco Use  . Smoking status: Light Tobacco Smoker    Types: Cigarettes  . Smokeless tobacco: Never Used  . Tobacco comment: social smoker when having a beer.   Vaping Use  . Vaping Use: Never used  Substance and Sexual Activity  . Alcohol use: Yes    Comment: social - at most 5 beers per month  . Drug use: No  . Sexual activity: Not on file  Other Topics Concern  . Not on file  Social History Narrative   Lives with family.   Right-handed.   3 biological children, 3 stepchildren   Caffeine use: 2 cups per day.   Social Determinants of Health   Financial Resource Strain: Not on file  Food Insecurity: Not on file  Transportation Needs: Not on file  Physical Activity: Not on file  Stress: Not on file  Social Connections: Not on file  Intimate Partner Violence: Not on file     PHYSICAL EXAM  GENERAL EXAM/CONSTITUTIONAL: Vitals:  Vitals:   07/28/20 1059  BP: (!) 147/93  Pulse: 63  Weight: 267 lb (121.1 kg)  Height: 5\' 8"  (1.727 m)     Body mass index is 40.6 kg/m. Wt Readings from Last 3 Encounters:  07/28/20 267 lb (121.1 kg)  07/28/20 266 lb 12.8 oz (121 kg)  06/30/20 259 lb 1.6 oz (117.5 kg)     Patient is in no distress; well developed, nourished and groomed; neck is supple  CARDIOVASCULAR:  Examination of carotid arteries is normal; no carotid bruits  Regular rate and rhythm, no murmurs  Examination of peripheral vascular system by observation and palpation is normal  EYES:  Ophthalmoscopic exam of optic discs and posterior segments is normal; no papilledema or hemorrhages  No exam data present  MUSCULOSKELETAL:  Gait, strength, tone, movements noted in Neurologic exam below  NEUROLOGIC: MENTAL STATUS:  No flowsheet data found.  awake, alert, oriented to person, place and time  recent and remote memory intact  normal attention and concentration  language fluent, comprehension  intact, naming intact  fund of knowledge appropriate  CRANIAL NERVE:   2nd - no papilledema on fundoscopic exam  2nd, 3rd, 4th, 6th - pupils equal and reactive to light, visual fields full to confrontation, extraocular muscles intact, no nystagmus  5th - facial sensation symmetric  7th - facial strength symmetric  8th - hearing intact  9th - palate elevates symmetrically, uvula midline  11th - shoulder shrug symmetric  12th - tongue protrusion midline  MOTOR:   normal bulk and tone, full strength in the BUE, BLE  SENSORY:   normal and symmetric to light touch, temperature, vibration  COORDINATION:   finger-nose-finger, fine finger movements normal  REFLEXES:   deep tendon reflexes present  and symmetric  GAIT/STATION:   narrow based gait     DIAGNOSTIC DATA (LABS, IMAGING, TESTING) - I reviewed patient records, labs, notes, testing and imaging myself where available.  Lab Results  Component Value Date   WBC 7.3 06/10/2020   HGB 13.3 06/10/2020   HCT 42.2 06/10/2020   MCV 87.4 06/10/2020   PLT 204 06/10/2020      Component Value Date/Time   NA 141 06/20/2020 1030   K 3.5 06/20/2020 1030   CL 102 06/20/2020 1030   CO2 28 06/20/2020 1030   GLUCOSE 99 06/20/2020 1030   GLUCOSE 117 (H) 06/10/2020 2023   BUN 17 06/20/2020 1030   CREATININE 1.17 06/20/2020 1030   CALCIUM 9.3 06/20/2020 1030   PROT 6.8 05/02/2020 0302   ALBUMIN 3.2 (L) 05/02/2020 0302   AST 13 (L) 05/02/2020 0302   ALT 16 05/02/2020 0302   ALKPHOS 58 05/02/2020 0302   BILITOT 0.2 (L) 05/02/2020 0302   GFRNONAA 71 06/20/2020 1030   GFRNONAA 51 (L) 06/10/2020 2023   GFRAA 82 06/20/2020 1030   Lab Results  Component Value Date   CHOL 235 (H) 05/31/2020   HDL 39 (L) 05/31/2020   LDLCALC 178 (H) 05/31/2020   TRIG 79 05/31/2020   CHOLHDL 6.0 (H) 05/31/2020   Lab Results  Component Value Date   HGBA1C 5.8 (H) 04/29/2020   No results found for: VITAMINB12 No results found for:  TSH    04/27/20 MRI brain [I reviewed images myself and agree with interpretation; more likely to be small cortical dysplasia than stroke. -VRP]  1. Motion degraded exam. 2. No acute intracranial abnormality. 3. Small remote left parietal infarct with underlying mild chronic microvascular ischemic disease. 4. Incidental pericallosal lipoma. ADDENDUM: In addition to the initially described findings, the initially described left parietal defect could also potentially reflect a small focal cortical dysplasia rather than a remote ischemic insult.   05/01/20 Long term EEG IMPRESSION: This study is within normal limits. No seizures or epileptiform discharges were seen throughout the recording.    ASSESSMENT AND PLAN  55 y.o. 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  Dx:  1. Seizure disorder ([place])     PLAN:  SEIZURE DISORDER - restart levetiracetam 500mg  twice a day   - According to  law, you can not drive unless you are seizure / syncope free for at least 6 months and under physician's care.   - Please maintain precautions. Do not participate in activities where a loss of awareness could harm you or someone else. No swimming alone, no tub bathing, no hot tubs, no driving, no operating motorized vehicles (cars, ATVs, motocycles, etc), lawnmowers, power tools or firearms. No standing at heights, such as rooftops, ladders or stairs. Avoid hot objects such as stoves, heaters, open fires. Wear a helmet when riding a bicycle, scooter, skateboard, etc. and avoid areas of traffic. Set your water heater to 120 degrees or less.   Meds ordered this encounter  Medications  . levETIRAcetam (KEPPRA) 500 MG tablet    Sig: Take 1 tablet (500 mg total) by mouth 2 (two) times daily.    Dispense:  180 tablet    Refill:  4   Return in about 1 year (around 07/28/2021) for with NP (Amy Lomax).    Penni Bombard, MD 02/20/4234, 36:14 AM Certified in Neurology, Neurophysiology and Neuroimaging  [facility] [address]  [place], [place] [address] [phone]

## 2020-07-31 NOTE — Progress Notes (Deleted)
Office Visit    Patient Name: DVID PENDRY Date of Encounter: 07/31/2020  Primary Care Provider:  Cletis Athens, MD Primary Cardiologist:  Nelva Bush, MD Electrophysiologist:  None   Chief Complaint    SUFIAN RAVI is a 55 y.o. male with a hx of *** presents today for follow up after echocardiogram and carotid duplex.    Past Medical History    Past Medical History:  Diagnosis Date  . Hypertension   . Seizure ([place])   . Stroke [facility])    Past Surgical History:  Procedure Laterality Date  . BACK SURGERY    . COLONOSCOPY WITH PROPOFOL N/A 02/12/2019   Procedure: COLONOSCOPY WITH PROPOFOL;  Surgeon: Virgel Manifold, MD;  Location: [facility] ENDOSCOPY;  Service: Endoscopy;  Laterality: N/A;    Allergies  No Known Allergies  History of Present Illness    ARTHA CHIASSON is a 55 y.o. male with a hx of HTN, OSA, seizure, dilation of ascending aorta, chest pain, dyspnea, aortic atherosclerosis, hyperlipidemia, pulmonary nodules last seen 06/20/20.   He was seen in consult by Dr. Saunders Revel for evaluation of hypertension. He was hospitalized 04/28/20 after witnessed seizure, head CT notable for intracranial lipoma and remote left parietal stroke versus focal cortical dysplasia.    When seen by Dr. Saunders Revel he noted fatigue, orthopnea without functional CPAP, nonexertional chest pain, and dyspnea on exertion. Murmur was noted on exam. He was recommended for echo and carotid duplex with consideration of cardiac CTA at follow up. He was started on Chlorthalidone for improved BP control.   Seen in the ED 06/10/20 due to left sided chest pain worse with movement. He chest wall was tender on palpation and he was given lidocaine patch. Troponin negative x2 and CXR without concerning findings.  Carotid duplex 06/13/20 with no evidence of plaque nor stenosis bilaterally. Echo 06/13/20 with LVEF 55-60%, unable to evaluate wall motion, moderate LVH, grade 1 diastolic dysfunction, RV mildly enlarged, no  significant valvular abnormalities, mild dilation of aortic root (41mm), mildly dilated pulmonary artery.   Seen in follow up 06/20/20. His Bystolic was increased by primary care and Amlodipine as well as Atorvastatin added. His BP remained uncontrolled and Hydralazine 25mg  BID was added to his regimen. Cardiac CTA 07/24/2020 with coronary calcium score of zero. Ascending thoracic aorta aneurysm measuring at least 4.5 cm though CT chest angiogram measured at 4.2 cm, recommended for annual imaging. Inadvertent finding of bilateral solid pulmonary nodules, largest 55mm with RLL recommended for non contrast chest CT in 3-6 months and if stable, repeat in 18-24 months. Also with aortic atherosclerosis.  He presents today for follow up.   ***  EKGs/Labs/Other Studies Reviewed:   The following studies were reviewed today:  Cardiac CTA 07/24/20  IMPRESSION: 1. Coronary calcium score of 0. Patient is low risk for coronary events   2. Normal coronary origin with right dominance.   3. No evidence of CAD.   4. CAD-RADS 0. Consider non-atherosclerotic causes of chest pain. IMPRESSION: 1. Ascending thoracic aortic aneurysm measuring at least 4.5 cm diameter. Please see the separate concurrent chest CT angiogram report today for further details and follow-up recommendations. 2. Bilateral solid pulmonary nodules, largest 7 mm in the right lower lobe. Non-contrast chest CT at 3-6 months is recommended. If the nodules are stable at time of repeat CT, then future CT at 18-24 months (from today's scan) is considered optional for low-risk patients, but is recommended for high-risk patients. This recommendation follows the consensus  statement: Guidelines for Management of Incidental Pulmonary Nodules Detected on CT Images: From the Fleischner Society 2017; Radiology 2017; 284:228-243. 3. Mild cardiomegaly. 4. Aortic Atherosclerosis (ICD10-I70.0).  CT chest angiography 07/24/20 IMPRESSION: 1. Mild  aneurysmal dilatation of the ascending thoracic aorta measuring 4.2 cm in greatest estimated diameter. Recommend annual imaging followup by CTA or MRA. This recommendation follows 2010 ACCF/AHA/AATS/ACR/ASA/SCA/SCAI/SIR/STS/SVM Guidelines for the Diagnosis and Management of Patients with Thoracic Aortic Disease. Circulation. 2010; 121: M578-I696. Aortic aneurysm NOS (ICD10-I71.9) 2. Mild cardiomegaly with suggestion of left ventricular hypertrophy by CT. 3. Hepatic steatosis without overt cirrhosis. 4. Small nonobstructing calculus in the right mid kidney. 5. As indicated in the over read for the coronary CTA study, there is a triangular shaped nodular region at the right lung base measuring roughly 5 x 8 mm. Non-contrast chest CT at 6-12 months is recommended. If the nodule is stable at time of repeat CT, then future CT at 18-24 months (from today's scan) is considered optional for low-risk patients, but is recommended for high-risk patients. This recommendation follows the consensus statement: Guidelines for Management of Incidental Pulmonary Nodules Detected on CT Images: From the Fleischner Society 2017; Radiology 2017; 284:228-243.  Echo 06/13/20  1. Left ventricular ejection fraction, by estimation, is 55 to 60%. The  left ventricle has normal function. Left ventricular endocardial border  not optimally defined to evaluate regional wall motion. There is moderate  left ventricular hypertrophy. Left  ventricular diastolic parameters are consistent with Grade I diastolic  dysfunction (impaired relaxation).   2. Right ventricular systolic function is normal. The right ventricular  size is mildly enlarged. Tricuspid regurgitation signal is inadequate for  assessing PA pressure.   3. The mitral valve is normal in structure. No evidence of mitral valve  regurgitation. No evidence of mitral stenosis.   4. The aortic valve has an indeterminant number of cusps. Aortic valve   regurgitation is not visualized. No aortic stenosis is present.   5. Aortic dilatation noted. There is mild dilatation of the aortic root,  measuring 40 mm.   6. Mildly dilated pulmonary artery.   Carotid duplex 06/13/20 Summary:  Right Carotid: There was no evidence of thrombus, dissection,  atherosclerotic                plaque or stenosis in the cervical carotid system.   Left Carotid: There was no evidence of thrombus, dissection,  atherosclerotic               plaque or stenosis in the cervical carotid system.   Vertebrals:  Bilateral vertebral arteries demonstrate antegrade flow.  Subclavians: Normal flow hemodynamics were seen in bilateral subclavian               arteries.    EKG:  EKG is  ordered today.  The ekg ordered today demonstrates NSR 63 bpm with stable TWI in lead III. ***  Recent Labs: 05/02/2020: ALT 16; Magnesium 2.3 06/10/2020: Hemoglobin 13.3; Platelets 204 06/20/2020: BUN 17; Creatinine, Ser 1.17; Potassium 3.5; Sodium 141  Recent Lipid Panel    Component Value Date/Time   CHOL 235 (H) 05/31/2020 1113   TRIG 79 05/31/2020 1113   HDL 39 (L) 05/31/2020 1113   CHOLHDL 6.0 (H) 05/31/2020 1113   LDLCALC 178 (H) 05/31/2020 1113    Home Medications   No outpatient medications have been marked as taking for the 08/01/20 encounter (Appointment) with Loel Dubonnet, NP.     Review of Systems  All other systems reviewed  and are otherwise negative except as noted above.  Physical Exam    VS:  There were no vitals taken for this visit. , BMI There is no height or weight on file to calculate BMI.  Wt Readings from Last 3 Encounters:  07/28/20 267 lb (121.1 kg)  07/28/20 266 lb 12.8 oz (121 kg)  06/30/20 259 lb 1.6 oz (117.5 kg)    GEN: Well nourished, overweight, well developed, in no acute distress. HEENT: normal. Neck: Supple, no JVD, carotid bruits, or masses. Cardiac: RRR, no , rubs, or gallops. Gr 2/6 systolic murmur. No clubbing, cyanosis,  edema.  Radials/DP/PT 2+ and equal bilaterally.  Respiratory:  Respirations regular and unlabored, clear to auscultation bilaterally. GI: Soft, nontender, nondistended. MS: No deformity or atrophy. Skin: Warm and dry, no rash. Neuro:  Strength and sensation are intact. Psych: Normal affect.  Assessment & Plan    1. Shortness of breath, chest pain, murmur - Echo 06/13/20 LVEF 55-60%, gr1DD, moderate LVH, no significant valvular abnormalities, mildly dilated aortic root 27mm. Cardiac CTA 07/24/20 with calcium score of zero.***  2. Thoracic aortic aneurysm- Likely caused by uncontrolled hypertension. ***  3. LVH - Moderate by echo 06/13/20. Likely secondary to hypertension. Recommend optimal BP control.  4. HTN - *** If BP remains uncontrolled may need to consider renal duplex for exclusion of secondary hypertension. ***  5. Aortic atherosclerosis -   6. Pulmonary nodules - Inadvertent finding by cardiac CTA. Recommended for non contrast CT in 3-6 months for monitoring. Will follow with PCP.    7. OSA - Working with DME to get CPAP. Discussed importance of CPAP in context of HTN. Sleep has improved some since improved BP control.***  8. Morbid obesity - Weight loss via diet and exercise encouraged. Discussed focusing on dietary changes as his back pain makes exercise difficult. Discussed implications of weight on HTN, LVH, and thoracic aortic aneurysm. ***  Disposition: Follow up*** in 6 week(s) with Dr. Saunders Revel or APP   Signed, Loel Dubonnet, NP 07/31/2020, 11:17 AM [place]

## 2020-08-01 ENCOUNTER — Ambulatory Visit: Payer: Commercial Managed Care - PPO | Admitting: Family

## 2020-08-03 ENCOUNTER — Encounter: Payer: Self-pay | Admitting: Family

## 2020-08-18 ENCOUNTER — Other Ambulatory Visit: Payer: Self-pay

## 2020-08-18 ENCOUNTER — Other Ambulatory Visit (INDEPENDENT_AMBULATORY_CARE_PROVIDER_SITE_OTHER): Payer: Commercial Managed Care - PPO

## 2020-08-18 DIAGNOSIS — Z23 Encounter for immunization: Secondary | ICD-10-CM

## 2020-08-18 NOTE — Addendum Note (Signed)
Addended by: Alois Cliche on: 08/18/2020 09:04 AM   Modules accepted: Level of Service

## 2020-09-29 ENCOUNTER — Other Ambulatory Visit: Payer: Self-pay

## 2020-09-29 ENCOUNTER — Encounter: Payer: Self-pay | Admitting: Family Medicine

## 2020-09-29 ENCOUNTER — Ambulatory Visit (INDEPENDENT_AMBULATORY_CARE_PROVIDER_SITE_OTHER): Payer: Commercial Managed Care - PPO | Admitting: Family Medicine

## 2020-09-29 VITALS — BP 168/101 | HR 82 | Ht 68.0 in | Wt 268.3 lb

## 2020-09-29 DIAGNOSIS — G4733 Obstructive sleep apnea (adult) (pediatric): Secondary | ICD-10-CM | POA: Diagnosis not present

## 2020-09-29 DIAGNOSIS — I10 Essential (primary) hypertension: Secondary | ICD-10-CM

## 2020-09-29 NOTE — Assessment & Plan Note (Signed)
Patient still not using CPAP states that he is having logistics issues with the resp company.

## 2020-09-29 NOTE — Assessment & Plan Note (Signed)
2 lb weight gain in last 3 months, no change in diet.

## 2020-09-29 NOTE — Progress Notes (Signed)
Established Patient Office Visit  SUBJECTIVE:  Subjective  Patient ID: Jake Kirby, male    DOB: 11-12-1965  Age: 55 y.o. MRN: [id number]  CC:  Chief Complaint  Patient presents with  . Hypertension    HPI [name] is a 55 y.o. male presenting today for     Past Medical History:  Diagnosis Date  . Hypertension   . Seizure ([place])   . Stroke [facility])     Past Surgical History:  Procedure Laterality Date  . BACK SURGERY    . COLONOSCOPY WITH PROPOFOL N/A 02/12/2019   Procedure: COLONOSCOPY WITH PROPOFOL;  Surgeon: Virgel Manifold, MD;  Location: [facility] ENDOSCOPY;  Service: Endoscopy;  Laterality: N/A;    Family History  Problem Relation Age of Onset  . Hypertension Mother   . Diabetes Mother   . Heart attack Father 10    Social History   Socioeconomic History  . Marital status: Married    Spouse name: Not on file  . Number of children: 3  . Years of education: some college  . Highest education level: Not on file  Occupational History  . Occupation: Firefighter  Tobacco Use  . Smoking status: Light Tobacco Smoker    Types: Cigarettes  . Smokeless tobacco: Never Used  . Tobacco comment: social smoker when having a beer.   Vaping Use  . Vaping Use: Never used  Substance and Sexual Activity  . Alcohol use: Yes    Comment: social - at most 5 beers per month  . Drug use: No  . Sexual activity: Not on file  Other Topics Concern  . Not on file  Social History Narrative   Lives with family.   Right-handed.   3 biological children, 3 stepchildren   Caffeine use: 2 cups per day.   Social Determinants of Health   Financial Resource Strain: Not on file  Food Insecurity: Not on file  Transportation Needs: Not on file  Physical Activity: Not on file  Stress: Not on file  Social Connections: Not on file  Intimate Partner Violence: Not on file     Current Outpatient Medications:  .  acetaminophen (TYLENOL) 325 MG tablet, Take 2 tablets (650 mg  total) by mouth every 6 (six) hours as needed for mild pain, fever or headache., Disp: 20 tablet, Rfl: 0 .  aspirin EC 81 MG tablet, Take 81 mg by mouth daily. Swallow whole., Disp: , Rfl:  .  atorvastatin (LIPITOR) 20 MG tablet, Take 1 tablet (20 mg total) by mouth daily., Disp: 90 tablet, Rfl: 3 .  hydrALAZINE (APRESOLINE) 25 MG tablet, Take 1 tablet (25 mg total) by mouth in the morning and at bedtime., Disp: 180 tablet, Rfl: 1 .  isosorbide mononitrate (IMDUR) 30 MG 24 hr tablet, Take 1 tablet (30 mg total) by mouth daily., Disp: 30 tablet, Rfl: 6 .  ketorolac (TORADOL) 10 MG tablet, Take 1 tablet (10 mg total) by mouth every 8 (eight) hours as needed for severe pain., Disp: 20 tablet, Rfl: 0 .  levETIRAcetam (KEPPRA) 500 MG tablet, Take 1 tablet (500 mg total) by mouth 2 (two) times daily., Disp: 180 tablet, Rfl: 4 .  lidocaine (LIDODERM) 5 %, Place 1 patch onto the skin every 12 (twelve) hours. Remove & Discard patch within 12 hours or as directed by MD, Disp: 10 patch, Rfl: 0 .  lisinopril (ZESTRIL) 40 MG tablet, Take 1 tablet (40 mg total) by mouth daily., Disp: 30 tablet, Rfl: 3 .  amLODipine (NORVASC) 5 MG tablet, Take 2 tablets (10 mg total) by mouth daily., Disp: 90 tablet, Rfl: 2 .  metoprolol tartrate (LOPRESSOR) 100 MG tablet, Take 1 tablet (100 mg total) by mouth once for 1 dose. Take TWO hours prior to CT procedure, Disp: 1 tablet, Rfl: 0 .  nebivolol 20 MG TABS, Take 1 tablet (20 mg total) by mouth 2 (two) times daily., Disp: 60 tablet, Rfl: 2   No Known Allergies  ROS Review of Systems  Constitutional: Negative.   HENT: Negative.   Respiratory: Negative.   Cardiovascular: Negative.   Gastrointestinal: Negative.   Musculoskeletal: Positive for back pain, gait problem and myalgias.  Neurological: Positive for weakness. Negative for seizures.  Psychiatric/Behavioral: Negative.      OBJECTIVE:    Physical Exam Vitals and nursing note reviewed.  Constitutional:       Appearance: Normal appearance.  HENT:     Right Ear: Tympanic membrane normal.     Left Ear: Tympanic membrane normal.     Mouth/Throat:     Mouth: Mucous membranes are moist.  Cardiovascular:     Rate and Rhythm: Normal rate and regular rhythm.  Pulmonary:     Effort: Pulmonary effort is normal.  Musculoskeletal:        General: Tenderness present.  Neurological:     Mental Status: He is alert.  Psychiatric:        Mood and Affect: Mood normal.     BP (!) 168/101   Pulse 82   Ht 5\' 8"  (1.727 m)   Wt 268 lb 4.8 oz (121.7 kg)   BMI 40.79 kg/m  Wt Readings from Last 3 Encounters:  09/29/20 268 lb 4.8 oz (121.7 kg)  07/28/20 267 lb (121.1 kg)  07/28/20 266 lb 12.8 oz (121 kg)    Health Maintenance Due  Topic Date Due  . Hepatitis C Screening  Never done  . TETANUS/TDAP  Never done  . COVID-19 Vaccine (2 - Moderna 3-dose series) 09/15/2020    There are no preventive care reminders to display for this patient.  CBC Latest Ref Rng & Units 06/10/2020 05/02/2020 05/01/2020  WBC 4.0 - 10.5 K/uL 7.3 8.0 8.7  Hemoglobin 13.0 - 17.0 g/dL 13.3 13.0 13.2  Hematocrit 39.0 - 52.0 % 42.2 41.7 42.5  Platelets 150 - 400 K/uL 204 226 230   CMP Latest Ref Rng & Units 06/20/2020 06/10/2020 05/02/2020  Glucose 65 - 99 mg/dL 99 117(H) 109(H)  BUN 6 - 24 mg/dL 17 27(H) 12  Creatinine 0.76 - 1.27 mg/dL 1.17 1.60(H) 1.14  Sodium 134 - 144 mmol/L 141 137 139  Potassium 3.5 - 5.2 mmol/L 3.5 3.0(L) 3.6  Chloride 96 - 106 mmol/L 102 99 103  CO2 20 - 29 mmol/L 28 23 27   Calcium 8.7 - 10.2 mg/dL 9.3 9.0 8.8(L)  Total Protein 6.5 - 8.1 g/dL - - 6.8  Total Bilirubin 0.3 - 1.2 mg/dL - - 0.2(L)  Alkaline Phos 38 - 126 U/L - - 58  AST 15 - 41 U/L - - 13(L)  ALT 0 - 44 U/L - - 16    No results found for: TSH Lab Results  Component Value Date   ALBUMIN 3.2 (L) 05/02/2020   ANIONGAP 15 06/10/2020   Lab Results  Component Value Date   CHOL 235 (H) 05/31/2020   CHOL 263 (H) 05/19/2020    HDL 39 (L) 05/31/2020   HDL 42 05/19/2020   LDLCALC 178 (H) 05/31/2020   [place]  201 (H) 05/19/2020   CHOLHDL 6.0 (H) 05/31/2020   CHOLHDL 6.3 (H) 05/19/2020   Lab Results  Component Value Date   TRIG 79 05/31/2020   Lab Results  Component Value Date   HGBA1C 5.8 (H) 04/29/2020      ASSESSMENT & PLAN:   Problem List Items Addressed This Visit      Cardiovascular and Mediastinum   Essential hypertension - Primary    Patient taking all meds as rx. He has associated pain that may be increasing his BP.   Plan- Next visit take medication 2 hours before visit, bring all meds for medication reconciliation.         Respiratory   Sleep apnea, obstructive    Patient still not using CPAP states that he is having logistics issues with the resp company.         Other   Morbid obesity ([place])    2 lb weight gain in last 3 months, no change in diet.          No orders of the defined types were placed in this encounter.      Follow-up: No follow-ups on file.    Beckie Salts, [place] [address] [place], [place] [address]

## 2020-09-29 NOTE — Assessment & Plan Note (Signed)
Patient taking all meds as rx. He has associated pain that may be increasing his BP.   Plan- Next visit take medication 2 hours before visit, bring all meds for medication reconciliation.

## 2020-10-05 ENCOUNTER — Telehealth: Payer: Self-pay | Admitting: Internal Medicine

## 2020-10-05 NOTE — Telephone Encounter (Signed)
Called patient to make aware we received forms from [employer] patient he will need to complete form packet and pay $29 fee Patient will come by at his convenience

## 2020-10-06 DIAGNOSIS — Z0279 Encounter for issue of other medical certificate: Secondary | ICD-10-CM

## 2020-10-06 NOTE — Telephone Encounter (Signed)
Patient filled out required forms and release, paid $29 fee. Form sent to billing and is now scanned into chart Patient forms placed in nurse box

## 2020-10-06 NOTE — Telephone Encounter (Signed)
Forms placed in Dr Darnelle Bos office basket.

## 2020-10-10 ENCOUNTER — Encounter: Payer: Self-pay | Admitting: Internal Medicine

## 2020-10-10 DIAGNOSIS — I712 Thoracic aortic aneurysm, without rupture, unspecified: Secondary | ICD-10-CM | POA: Insufficient documentation

## 2020-10-10 NOTE — Telephone Encounter (Signed)
Forms given back to [facility] for further processing.

## 2020-10-10 NOTE — Telephone Encounter (Signed)
Forms faxed to [employer], patient made aware and forms scanned into chart.

## 2020-10-13 NOTE — Telephone Encounter (Signed)
Patient payment completed, receipt mailed to patient

## 2020-10-23 ENCOUNTER — Other Ambulatory Visit: Payer: Self-pay | Admitting: Neurosurgery

## 2020-10-23 DIAGNOSIS — M5440 Lumbago with sciatica, unspecified side: Secondary | ICD-10-CM

## 2020-11-01 ENCOUNTER — Encounter: Payer: Self-pay | Admitting: *Deleted

## 2020-11-01 ENCOUNTER — Telehealth: Payer: Self-pay | Admitting: *Deleted

## 2020-11-01 NOTE — Telephone Encounter (Signed)
Called patient to discuss disability papers. He stated his PCP took him out of work. I advised that per Dr Gerda Diss note he has [place] DMV driving restrictions and reviewed MD's other precautions with patient per note. I advised Dr Leta Baptist didn't state he cannot work, advised he have PCP complete form. He will call [employer] to discuss. I stated we can fax notes to [place] if needed and release is signed. I told him we will hold onto papers until he speaks with [facility].  Patient verbalized understanding, appreciation.

## 2020-11-01 NOTE — Telephone Encounter (Signed)
Pt [employer] form on [employer].

## 2020-11-15 ENCOUNTER — Ambulatory Visit
Admission: RE | Admit: 2020-11-15 | Discharge: 2020-11-15 | Disposition: A | Discharge status: Home / Self Care | Payer: Commercial Managed Care - PPO | Source: Ambulatory Visit | Attending: Neurosurgery | Admitting: Neurosurgery

## 2020-11-15 ENCOUNTER — Other Ambulatory Visit: Payer: Self-pay

## 2020-11-15 DIAGNOSIS — M5440 Lumbago with sciatica, unspecified side: Secondary | ICD-10-CM

## 2020-11-15 MED ORDER — GADOBENATE DIMEGLUMINE 529 MG/ML IV SOLN
20.0000 mL | Freq: Once | INTRAVENOUS | Status: AC | PRN
  Administered 2020-11-15: 20 mL via INTRAVENOUS

## 2020-11-21 NOTE — Telephone Encounter (Signed)
[employer] (April) called,how is seizure driving restrictions and precautions monitored. Would like a call from the nurse.  Contact info: [phone]

## 2020-11-22 NOTE — Telephone Encounter (Signed)
Called Jake Kirby and answered her questions to her stated satisfaction. She stated he reported possible spells in early Jan. She will state he is not to drive for 6 months from Jan 2022. She  verbalized understanding, appreciation.

## 2020-11-22 NOTE — Telephone Encounter (Signed)
Called April, [employer] LVM requesting call back.

## 2020-11-22 NOTE — Telephone Encounter (Signed)
April returned call. Please call back when available.

## 2021-03-07 ENCOUNTER — Other Ambulatory Visit: Payer: Self-pay | Admitting: Neurosurgery

## 2021-03-07 DIAGNOSIS — M25559 Pain in unspecified hip: Secondary | ICD-10-CM

## 2021-03-27 ENCOUNTER — Ambulatory Visit
Admission: RE | Admit: 2021-03-27 | Discharge: 2021-03-27 | Disposition: A | Discharge status: Home / Self Care | Payer: Commercial Managed Care - PPO | Source: Ambulatory Visit | Attending: Neurosurgery | Admitting: Neurosurgery

## 2021-03-27 ENCOUNTER — Other Ambulatory Visit: Payer: Self-pay

## 2021-03-27 DIAGNOSIS — M25559 Pain in unspecified hip: Secondary | ICD-10-CM

## 2021-05-25 ENCOUNTER — Telehealth: Payer: Self-pay | Admitting: Internal Medicine

## 2021-05-25 NOTE — Telephone Encounter (Signed)
   [facility] Pre-operative Risk Assessment    Patient Name: Jake Kirby  DOB: 15-Oct-1965 MRN: [id number]  HEARTCARE STAFF:  - IMPORTANT!!!!!! Under Visit Info/Reason for Call, type in Other and utilize the format Clearance MM/DD/YY or Clearance TBD. Do not use dashes or single digits. - Please review there is not already an duplicate clearance open for this procedure. - If request is for dental extraction, please clarify the # of teeth to be extracted. - If the patient is currently at the dentist's office, call Pre-Op Callback Staff (MA/nurse) to input urgent request.  - If the patient is not currently in the dentist office, please route to the Pre-Op pool.  Request for surgical clearance:  What type of surgery is being performed? RT THA anterior hip  When is this surgery scheduled? 07/18/21  What type of clearance is required (medical clearance vs. Pharmacy clearance to hold med vs. Both)? both  Are there any medications that need to be held prior to surgery and how long? Not listed  Practice name and name of physician performing surgery? [facility] - Dr Kurtis Bushman - [facility]  What is the office phone number? [phone]   7.   What is the office fax number? [phone]  8.   Anesthesia type (None, local, MAC, general) ? Local/spinal   Ace Gins 05/25/2021, 3:01 PM  _________________________________________________________________   (provider comments below)

## 2021-05-27 NOTE — Telephone Encounter (Signed)
   Name: KILEY TORRENCE  DOB: May 01, 1966  MRN: [id number]  Primary Cardiologist: Nelva Bush, MD  Chart reviewed as part of pre-operative protocol coverage. Because of Murice Barbar Lopezmartinez's past medical history and time since last visit, he will require a follow-up visit in order to better assess preoperative cardiovascular risk.  Pre-op covering staff: - Please schedule appointment and call patient to inform them. Please add "pre-op clearance" to the appointment notes so provider is aware. - Please contact requesting surgeon's office via preferred method (i.e, phone, fax) to inform them of need for appointment prior to surgery.  Abigail Butts, PA-C  05/27/2021, 11:09 PM

## 2021-05-28 NOTE — Telephone Encounter (Signed)
Pt agreeable to appt for pre op clearance. Pt is scheduled to see [place], [facility] 05/29/21 8:25 am. I will forward notes to [facility] for appt tomorrow. Will send FYI to surgeon's office pt has appt.

## 2021-05-29 ENCOUNTER — Encounter: Payer: Self-pay | Admitting: Medical

## 2021-05-29 ENCOUNTER — Ambulatory Visit: Payer: Commercial Managed Care - PPO | Admitting: Medical

## 2021-05-29 ENCOUNTER — Other Ambulatory Visit: Payer: Self-pay

## 2021-05-29 VITALS — BP 168/110 | HR 77 | Ht 68.5 in | Wt 273.5 lb

## 2021-05-29 DIAGNOSIS — I69398 Other sequelae of cerebral infarction: Secondary | ICD-10-CM

## 2021-05-29 DIAGNOSIS — I77810 Thoracic aortic ectasia: Secondary | ICD-10-CM

## 2021-05-29 DIAGNOSIS — R0602 Shortness of breath: Secondary | ICD-10-CM

## 2021-05-29 DIAGNOSIS — I10 Essential (primary) hypertension: Secondary | ICD-10-CM | POA: Diagnosis not present

## 2021-05-29 DIAGNOSIS — G40909 Epilepsy, unspecified, not intractable, without status epilepticus: Secondary | ICD-10-CM

## 2021-05-29 DIAGNOSIS — Z01818 Encounter for other preprocedural examination: Secondary | ICD-10-CM | POA: Diagnosis not present

## 2021-05-29 MED ORDER — ISOSORBIDE MONONITRATE ER 60 MG PO TB24: 60.0000 mg | ORAL_TABLET | Freq: Every day | ORAL | 6 refills | Status: DC

## 2021-05-29 NOTE — Patient Instructions (Signed)
Medication Instructions:  - Your physician has recommended you make the following change in your medication:   1) INCREASE imdur (isosorbide [place]) to 60 mg: - take 1 tablet by  mouth once daily   *If you need a refill on your cardiac medications before your next appointment, please call your pharmacy*   Lab Work: - Your physician recommends that you have lab work today: CMET/ CBC  If you have labs (blood work) drawn today and your tests are completely normal, you will receive your results only by: [employer] (if you have [place]) OR A paper copy in the mail If you have any lab test that is abnormal or we need to change your treatment, we will call you to review the results.   Testing/Procedures: - none ordered   Follow-Up: At [facility], you and your health needs are our priority.  As part of our continuing mission to provide you with exceptional heart care, we have created designated Provider Care Teams.  These Care Teams include your primary Cardiologist (physician) and Advanced Practice Providers (APPs -  Physician Assistants and Nurse Practitioners) who all work together to provide you with the care you need, when you need it.  We recommend signing up for the patient portal called "MyChart".  Sign up information is provided on this After Visit Summary.  MyChart is used to connect with patients for Virtual Visits (Telemedicine).  Patients are able to view lab/test results, encounter notes, upcoming appointments, etc.  Non-urgent messages can be sent to your provider as well.   To learn more about what you can do with MyChart, go to [email or web address].    Your next appointment:   2-3 week(s)  The format for your next appointment:   In Person  Provider:   You may see Nelva Bush, MD or one of the following Advanced Practice Providers on your designated Care Team:   Murray Hodgkins, NP Christell Faith, PA-C Cadence Kathlen Mody, [place]    Other Instructions  1) Check  blood pressures at home daily for the next 1-2 weeks  How to Take Your Blood Pressure Blood pressure is a measurement of how strongly your blood is pressing against the walls of your arteries. Arteries are blood vessels that carry blood from your heart throughout your body. Your health care provider takes your blood pressure at each office visit. You can also take your own blood pressure at home with a blood pressure monitor. You may need to take your own blood pressure to: Confirm a diagnosis of high blood pressure (hypertension). Monitor your blood pressure over time. Make sure your blood pressure medicine is working. Supplies needed: Blood pressure monitor. Dining room chair to sit in. Table or desk. Small notebook and pencil or pen. How to prepare To get the most accurate reading, avoid the following for 30 minutes before you check your blood pressure: Drinking caffeine. Drinking alcohol. Eating. Smoking. Exercising. Five minutes before you check your blood pressure: Use the bathroom and urinate so that you have an empty bladder. Sit quietly in a dining room chair. Do not sit in a soft couch or an armchair. Do not talk. How to take your blood pressure To check your blood pressure, follow the instructions in the manual that came with your blood pressure monitor. If you have a digital blood pressure monitor, the instructions may be as follows: Sit up straight in a chair. Place your feet on the floor. Do not cross your ankles or legs. Rest your left arm  at the level of your heart on a table or desk or on the arm of a chair. Pull up your shirt sleeve. Wrap the blood pressure cuff around the upper part of your left arm, 1 inch (2.5 cm) above your elbow. It is best to wrap the cuff around bare skin. Fit the cuff snugly around your arm. You should be able to place only one finger between the cuff and your arm. Position the cord so that it rests in the bend of your elbow. Press the power  button. Sit quietly while the cuff inflates and deflates. Read the digital reading on the monitor screen and write the numbers down (record them) in a notebook. Wait 2-3 minutes, then repeat the steps, starting at step 1. What does my blood pressure reading mean? A blood pressure reading consists of a higher number over a lower number. Ideally, your blood pressure should be below 120/80. The first ("top") number is called the systolic pressure. It is a measure of the pressure in your arteries as your heart beats. The second ("bottom") number is called the diastolic pressure. It is a measure of the pressure in your arteries as the heart relaxes. Blood pressure is classified into five stages. The following are the stages for adults who do not have a short-term serious illness or a chronic condition. Systolic pressure and diastolic pressure are measured in a unit called mm Hg (millimeters of mercury).  Normal Systolic pressure: below 412. Diastolic pressure: below 80. Elevated Systolic pressure: 878-676. Diastolic pressure: below 80. Hypertension stage 1 Systolic pressure: 720-947. Diastolic pressure: 09-62. Hypertension stage 2 Systolic pressure: 836 or above. Diastolic pressure: 90 or above. You can have elevated blood pressure or hypertension even if only the systolic or only the diastolic number in your reading is higher than normal. Follow these instructions at home: Medicines Take over-the-counter and prescription medicines only as told by your health care provider. Tell your health care provider if you are having any side effects from blood pressure medicine. General instructions Check your blood pressure as often as recommended by your health care provider. Check your blood pressure at the same time every day. Take your monitor to the next appointment with your health care provider to make sure that: You are using it correctly. It provides accurate readings. Understand what your  goal blood pressure numbers are. Keep all follow-up visits as told by your health care provider. This is important. General tips Your health care provider can suggest a reliable monitor that will meet your needs. There are several types of home blood pressure monitors. Choose a monitor that has an arm cuff. Do not choose a monitor that measures your blood pressure from your wrist or finger. Choose a cuff that wraps snugly around your upper arm. You should be able to fit only one finger between your arm and the cuff. You can buy a blood pressure monitor at most drugstores or online. Where to find more information American Heart Association: www.heart.org Contact a health care provider if: Your blood pressure is consistently high. Your blood pressure is suddenly low. Get help right away if: Your systolic blood pressure is higher than 180. Your diastolic blood pressure is higher than 120. Summary Blood pressure is a measurement of how strongly your blood is pressing against the walls of your arteries. A blood pressure reading consists of a higher number over a lower number. Ideally, your blood pressure should be below 120/80. Check your blood pressure at the same time  every day. Avoid caffeine, alcohol, smoking, and exercise for 30 minutes prior to checking your blood pressure. These agents can affect the accuracy of the blood pressure reading. This information is not intended to replace advice given to you by your health care provider. Make sure you discuss any questions you have with your health care provider. Document Revised: 05/17/2020 Document Reviewed: 07/02/2019 Elsevier Patient Education  2022 [employer].

## 2021-05-29 NOTE — Progress Notes (Signed)
Cardiology Office Note:    Date:  05/29/2021   ID:  Jake Kirby, DOB [birth year], MRN [id number]  PCP:  Cletis Athens, MD  [facility] Cardiologist:  Nelva Bush, MD  [facility] Electrophysiologist:  None   Referring MD: Cletis Athens, MD   Chief Complaint: pre-op clearance  History of Present Illness:    Jake Kirby is a 55 y.o. male with a hx of HTN, OSA, seizure, mild dilation of ascending aorta, chest pain, dyspnea who presents for pre-op clearance.   He was seen in consult by Dr. Saunders Revel for evaluation of hypertension. He was hospitalized 04/28/20 after witnessed seizure, head CT notable for intracranial lipoma and remote left parietal stroke versus focal cortical dysplasia.     When seen by Dr. Saunders Revel he noted fatigue, orthopnea without functional CPAP, nonexertional chest pain, and dyspnea on exertion. Murmur was noted on exam. He was recommended for echo and carotid duplex with consideration of cardiac CTA at follow up. He was started on Chlorthalidone for improved BP control.    Seen in the ED 06/10/20 due to left sided chest pain worse with movement. He chest wall was tender on palpation and he was given lidocaine patch. Troponin negative x2 and CXR without concerning findings.   Carotid duplex 06/13/20 with no evidence of plaque nor stenosis bilaterally. Echo 06/13/20 with LVEF 55-60%, unable to evaluate wall motion, moderate LVH, grade 1 diastolic dysfunction, RV mildly enlarged, no significant valvular abnormalities, mild dilation of aortic root (28mm), mildly dilated pulmonary artery.   Last seen 05/2020 and had a recent ER visit for chest pain and sob. Cardiac CTA was ordered. Hydralazine was started for high BP.   Cardiac CTA showed calcium score of 0, no CAD.   Today, the patient is planning on total hip replacement, on the right side. Has constant right sided hip pain, worse over the last year. Surgery is scheduled for December. Hip pain affects his sleep. He uses  a cane at all times. Prior to knee pain able to walk up a flight of stairs, mow the yard, walk 1-2 blocks. No chest pain . Has some shortness of breath when he does a lot of walking. No  orthopnea or pnd. Has intermittent dependent lower leg edema. Has gained a lot of weight since lifestyle is sedentary. He has OSA but does not use CPAP. BP very high, says this is from the pain. HE hasn't had medications today. At home it's around 140s/90s.   Past Medical History:  Diagnosis Date   Hypertension    Seizure ([place])    Stroke [employer])     Past Surgical History:  Procedure Laterality Date   BACK SURGERY     COLONOSCOPY WITH PROPOFOL N/A 02/12/2019   Procedure: COLONOSCOPY WITH PROPOFOL;  Surgeon: Virgel Manifold, MD;  Location: [facility] ENDOSCOPY;  Service: Endoscopy;  Laterality: N/A;    Current Medications: Current Meds  Medication Sig   acetaminophen (TYLENOL) 325 MG tablet Take 2 tablets (650 mg total) by mouth every 6 (six) hours as needed for mild pain, fever or headache.   amLODipine (NORVASC) 5 MG tablet Take 2 tablets (10 mg total) by mouth daily.   aspirin EC 81 MG tablet Take 81 mg by mouth daily. Swallow whole.   atorvastatin (LIPITOR) 20 MG tablet Take 1 tablet (20 mg total) by mouth daily.   hydrALAZINE (APRESOLINE) 25 MG tablet Take 1 tablet (25 mg total) by mouth in the morning and at bedtime.   isosorbide  mononitrate (IMDUR) 60 MG 24 hr tablet Take 1 tablet (60 mg total) by mouth daily.   ketorolac (TORADOL) 10 MG tablet Take 1 tablet (10 mg total) by mouth every 8 (eight) hours as needed for severe pain.   levETIRAcetam (KEPPRA) 500 MG tablet Take 1 tablet (500 mg total) by mouth 2 (two) times daily.   lidocaine (LIDODERM) 5 % Place 1 patch onto the skin every 12 (twelve) hours. Remove & Discard patch within 12 hours or as directed by MD   lisinopril (ZESTRIL) 40 MG tablet Take 1 tablet (40 mg total) by mouth daily.   nebivolol 20 MG TABS Take 1 tablet (20 mg total) by mouth 2  (two) times daily.   [DISCONTINUED] isosorbide mononitrate (IMDUR) 30 MG 24 hr tablet Take 1 tablet (30 mg total) by mouth daily.     Allergies:   Patient has no known allergies.   Social History   Socioeconomic History   Marital status: Married    Spouse name: Not on file   Number of children: 3   Years of education: some college   Highest education level: Not on file  Occupational History   Occupation: Firefighter  Tobacco Use   Smoking status: Light Smoker    Types: Cigarettes   Smokeless tobacco: Never   Tobacco comments:    social smoker when having a beer.   Vaping Use   Vaping Use: Never used  Substance and Sexual Activity   Alcohol use: Yes    Comment: social - at most 5 beers per month   Drug use: No   Sexual activity: Not on file  Other Topics Concern   Not on file  Social History Narrative   Lives with family.   Right-handed.   3 biological children, 3 stepchildren   Caffeine use: 2 cups per day.   Social Determinants of Health   Financial Resource Strain: Not on file  Food Insecurity: Not on file  Transportation Needs: Not on file  Physical Activity: Not on file  Stress: Not on file  Social Connections: Not on file     Family History: The patient's family history includes Diabetes in his mother; Heart attack (age of onset: 68) in his father; Hypertension in his mother.  ROS:   Please see the history of present illness.     All other systems reviewed and are negative.  EKGs/Labs/Other Studies Reviewed:    The following studies were reviewed today:  Cardiac CTA 06/2020 IMPRESSION: 1. Coronary calcium score of 0. Patient is low risk for coronary events   2. Normal coronary origin with right dominance.   3. No evidence of CAD.   4. CAD-RADS 0. Consider non-atherosclerotic causes of chest pain.   Electronically Signed: By: Kate Sable M.D. On: 07/20/2020 16:51  Echo 06/13/20  1. Left ventricular ejection fraction, by estimation,  is 55 to 60%. The  left ventricle has normal function. Left ventricular endocardial border  not optimally defined to evaluate regional wall motion. There is moderate  left ventricular hypertrophy. Left  ventricular diastolic parameters are consistent with Grade I diastolic  dysfunction (impaired relaxation).   2. Right ventricular systolic function is normal. The right ventricular  size is mildly enlarged. Tricuspid regurgitation signal is inadequate for  assessing PA pressure.   3. The mitral valve is normal in structure. No evidence of mitral valve  regurgitation. No evidence of mitral stenosis.   4. The aortic valve has an indeterminant number of cusps. Aortic valve  regurgitation is not visualized. No aortic stenosis is present.   5. Aortic dilatation noted. There is mild dilatation of the aortic root,  measuring 40 mm.   6. Mildly dilated pulmonary artery.    Carotid duplex 06/13/20 Summary:  Right Carotid: There was no evidence of thrombus, dissection,  atherosclerotic                plaque or stenosis in the cervical carotid system.   Left Carotid: There was no evidence of thrombus, dissection,  atherosclerotic               plaque or stenosis in the cervical carotid system.   Vertebrals:  Bilateral vertebral arteries demonstrate antegrade flow.  Subclavians: Normal flow hemodynamics were seen in bilateral subclavian               arteries.   EKG:  EKG is  ordered today.  The ekg ordered today demonstrates NSR, 77bpm, LAD, nonspecific T wave changes  Recent Labs: 06/10/2020: Hemoglobin 13.3; Platelets 204 06/20/2020: BUN 17; Creatinine, Ser 1.17; Potassium 3.5; Sodium 141  Recent Lipid Panel    Component Value Date/Time   CHOL 235 (H) 05/31/2020 1113   TRIG 79 05/31/2020 1113   HDL 39 (L) 05/31/2020 1113   CHOLHDL 6.0 (H) 05/31/2020 1113   LDLCALC 178 (H) 05/31/2020 1113    Physical Exam:    VS:  BP (!) 168/110 (BP Location: Left Arm, Patient Position: Sitting,  Cuff Size: Large)   Pulse 77   Ht 5' 8.5" (1.74 m)   Wt 273 lb 8 oz (124.1 kg)   SpO2 97%   BMI 40.98 kg/m     Wt Readings from Last 3 Encounters:  05/29/21 273 lb 8 oz (124.1 kg)  09/29/20 268 lb 4.8 oz (121.7 kg)  07/28/20 267 lb (121.1 kg)     GEN:  Well nourished, well developed in no acute distress HEENT: Normal NECK: No JVD; No carotid bruits LYMPHATICS: No lymphadenopathy CARDIAC: RRR, no murmurs, rubs, gallops RESPIRATORY:  Clear to auscultation without rales, wheezing or rhonchi  ABDOMEN: Soft, non-tender, non-distended MUSCULOSKELETAL:  No edema; No deformity  SKIN: Warm and dry NEUROLOGIC:  Alert and oriented x 3 PSYCHIATRIC:  Normal affect   ASSESSMENT:    1. Pre-op evaluation   2. Shortness of breath   3. Essential hypertension   4. Mild dilation of ascending aorta (HCC)   5. Seizure disorder as sequela of cerebrovascular accident ([place])   6. Morbid obesity ([place])    PLAN:    In order of problems listed above:  Mild dilation aortic root Mild aneurysm dilation of the ascending thoracic aorta measuring 4.2cm in greatest estimated diameter. Recommend repeat imaging in a year. Need good BP control.   HTN BP severely elevated today, however has not had medications. He says at home it runs 140s/90s. He takes amlodipine 10mg  dialy, Imdur 30mg  dialy, hydralazine 25mg  BID, and lisinopril 40 mg daily. I will increase Imdur to 60mg  dialy. Recommend bp check for 1-2 weeks. We will see him back in 2-3 weeks. Labs today, CMET and CBC.  Suspected OSA Patient was referred for sleep study, but was not scheduled. He was encouraged to follow-up up with this.   Seizure disorder Reports this is controlled with medications.  Morbid obesity Patient is very sedentary given longstanding right hip pain.  Pre-op clearance Plan for right right replacement in December 2022. Right hip pain is constant and severe, has been progressive for the last year.  He uses a cane. Prior to the  pain patient was very functional with METS>4. He denies chest pain, has some shortness of breath on exertion at times. Reports intermittent dependent lower leg edema. Echo 2021 showed LVEF 55-60%. Cardiac CTA showed calcium score of 0 and no CAD. EKG with no acute changes. No further cardiac testing prior to surgery, OK to proceed. According to Revised cardiac index patient is Class 2 risk at 6% 30 day risk of death, MI, or cardiac arrest.   Disposition: Follow up in 2-3 weeks week(s) with MD/APP    Signed, Dewaun Kinzler Ninfa Meeker, PA-C  05/29/2021 8:48 AM    [facility]

## 2021-05-30 LAB — CBC
Hematocrit: 40.9 % (ref 37.5–51.0)
Hemoglobin: 13.3 g/dL (ref 13.0–17.7)
MCH: 26.9 pg (ref 26.6–33.0)
MCHC: 32.5 g/dL (ref 31.5–35.7)
MCV: 83 fL (ref 79–97)
Platelets: 271 10*3/uL (ref 150–450)
RBC: 4.94 x10E6/uL (ref 4.14–5.80)
RDW: 13.1 % (ref 11.6–15.4)
WBC: 5.4 10*3/uL (ref 3.4–10.8)

## 2021-05-30 LAB — COMPREHENSIVE METABOLIC PANEL
ALT: 15 IU/L (ref 0–44)
AST: 13 IU/L (ref 0–40)
Albumin/Globulin Ratio: 1.3 (ref 1.2–2.2)
Albumin: 4.1 g/dL (ref 3.8–4.9)
Alkaline Phosphatase: 83 IU/L (ref 44–121)
BUN/Creatinine Ratio: 11 (ref 9–20)
BUN: 13 mg/dL (ref 6–24)
Bilirubin Total: 0.3 mg/dL (ref 0.0–1.2)
CO2: 26 mmol/L (ref 20–29)
Calcium: 9.1 mg/dL (ref 8.7–10.2)
Chloride: 104 mmol/L (ref 96–106)
Creatinine, Ser: 1.16 mg/dL (ref 0.76–1.27)
Globulin, Total: 3.1 g/dL (ref 1.5–4.5)
Glucose: 103 mg/dL — ABNORMAL HIGH (ref 70–99)
Potassium: 3.8 mmol/L (ref 3.5–5.2)
Sodium: 143 mmol/L (ref 134–144)
Total Protein: 7.2 g/dL (ref 6.0–8.5)
eGFR: 75 mL/min/{1.73_m2} (ref >59)

## 2021-06-25 ENCOUNTER — Ambulatory Visit (INDEPENDENT_AMBULATORY_CARE_PROVIDER_SITE_OTHER): Payer: Commercial Managed Care - PPO | Admitting: Medical

## 2021-06-25 ENCOUNTER — Other Ambulatory Visit: Payer: Self-pay | Admitting: Orthopedic Surgery

## 2021-06-25 ENCOUNTER — Encounter: Payer: Self-pay | Admitting: Medical

## 2021-06-25 ENCOUNTER — Encounter: Payer: Self-pay | Admitting: Orthopedic Surgery

## 2021-06-25 ENCOUNTER — Other Ambulatory Visit: Payer: Self-pay

## 2021-06-25 VITALS — BP 160/90 | HR 97 | Ht 68.5 in | Wt 278.0 lb

## 2021-06-25 DIAGNOSIS — I77810 Thoracic aortic ectasia: Secondary | ICD-10-CM | POA: Diagnosis not present

## 2021-06-25 DIAGNOSIS — G479 Sleep disorder, unspecified: Secondary | ICD-10-CM

## 2021-06-25 DIAGNOSIS — I69398 Other sequelae of cerebral infarction: Secondary | ICD-10-CM

## 2021-06-25 DIAGNOSIS — I10 Essential (primary) hypertension: Secondary | ICD-10-CM

## 2021-06-25 DIAGNOSIS — G40909 Epilepsy, unspecified, not intractable, without status epilepticus: Secondary | ICD-10-CM

## 2021-06-25 DIAGNOSIS — Z01818 Encounter for other preprocedural examination: Secondary | ICD-10-CM

## 2021-06-25 MED ORDER — LISINOPRIL 40 MG PO TABS: 40.0000 mg | ORAL_TABLET | Freq: Every day | ORAL | Status: DC

## 2021-06-25 MED ORDER — LISINOPRIL 40 MG PO TABS: 40.0000 mg | ORAL_TABLET | Freq: Every day | ORAL | 2 refills | Status: AC

## 2021-06-25 MED ORDER — ISOSORBIDE MONONITRATE ER 60 MG PO TB24: 90.0000 mg | ORAL_TABLET | Freq: Every day | ORAL | 6 refills | Status: DC

## 2021-06-25 NOTE — Patient Instructions (Addendum)
Medication Instructions:  - Your physician has recommended you make the following change in your medication:   1) RESTART lisinopril 40 mg: - take 1 tablet by mouth once daily    2) INCREASE imdur (isosorbide mononitrate) 60 mg: Take 1.5 tablet (90 mg) by mouth once daily   - if you would please ask you wife to call our office at [phone] to verify all of your current medications  *If you need a refill on your cardiac medications before your next appointment, please call your pharmacy*   Lab Work: - none ordered  If you have labs (blood work) drawn today and your tests are completely normal, you will receive your results only by: MyChart Message (if you have MyChart) OR A paper copy in the mail If you have any lab test that is abnormal or we need to change your treatment, we will call you to review the results.   Testing/Procedures: - none ordered   Follow-Up: At [facility], you and your health needs are our priority.  As part of our continuing mission to provide you with exceptional heart care, we have created designated Provider Care Teams.  These Care Teams include your primary Cardiologist (physician) and Advanced Practice Providers (APPs -  Physician Assistants and Nurse Practitioners) who all work together to provide you with the care you need, when you need it.  We recommend signing up for the patient portal called "MyChart".  Sign up information is provided on this After Visit Summary.  MyChart is used to connect with patients for Virtual Visits (Telemedicine).  Patients are able to view lab/test results, encounter notes, upcoming appointments, etc.  Non-urgent messages can be sent to your provider as well.   To learn more about what you can do with MyChart, go to [email or web address].    Your next appointment:   4-6 week(s)  The format for your next appointment:   In Person  Provider:   You may see Nelva Bush, MD or one of the following Advanced  Practice Providers on your designated Care Team:   Murray Hodgkins, NP Christell Faith, PA-C Cadence Kathlen Mody, [place]    Other Instructions N/a

## 2021-06-25 NOTE — Progress Notes (Signed)
Cardiology Office Note:    Date:  06/25/2021   ID:  Jake Kirby, DOB 20-Sep-1965, MRN [id number]  PCP:  Cletis Athens, MD  [facility] Cardiologist:  Nelva Bush, MD  [facility] Electrophysiologist:  None   Referring MD: Cletis Athens, MD   Chief Complaint: 2-3 week follow-up  History of Present Illness:    Jake Kirby is a 55 y.o. male with a hx of with a hx of HTN, OSA, seizure, mild dilation of ascending aorta, chest pain, dyspnea who presents for pre-op clearance.    He was seen in consult by Dr. Saunders Revel for evaluation of hypertension. He was hospitalized 04/28/20 after witnessed seizure, head CT notable for intracranial lipoma and remote left parietal stroke versus focal cortical dysplasia.     When seen by Dr. Saunders Revel he noted fatigue, orthopnea without functional CPAP, nonexertional chest pain, and dyspnea on exertion. Murmur was noted on exam. He was recommended for echo and carotid duplex with consideration of cardiac CTA at follow up. He was started on Chlorthalidone for improved BP control.    Seen in the ED 06/10/20 due to left sided chest pain worse with movement. He chest wall was tender on palpation and he was given lidocaine patch. Troponin negative x2 and CXR without concerning findings.   Carotid duplex 06/13/20 with no evidence of plaque nor stenosis bilaterally. Echo 06/13/20 with LVEF 55-60%, unable to evaluate wall motion, moderate LVH, grade 1 diastolic dysfunction, RV mildly enlarged, no significant valvular abnormalities, mild dilation of aortic root (37mm), mildly dilated pulmonary artery.    Seen 05/2020 and had a recent ER visit for chest pain and sob. Cardiac CTA was ordered. Hydralazine was started for high BP. Cardiac CTA showed calcium score of 0, no CAD.   Last seen 05/29/21 fore pre-op clearance. BP was high but had not had meds that morning.   Today, BP is still high and he had his medications this morning. Says he stopped lisinopril, but is not  sure why. Memory is not good since the stroke, and his wife is not here with him. He says BP at home is very high 180/100. Otherwise, no chest pain, sob, LLE, orthopnea, pnd. He has not followed up with PCP for CPAP machine.     Past Medical History:  Diagnosis Date   Hypertension    Seizure ([place])    Stroke [facility])     Past Surgical History:  Procedure Laterality Date   BACK SURGERY     COLONOSCOPY WITH PROPOFOL N/A 02/12/2019   Procedure: COLONOSCOPY WITH PROPOFOL;  Surgeon: Virgel Manifold, MD;  Location: [facility] ENDOSCOPY;  Service: Endoscopy;  Laterality: N/A;    Current Medications: Current Meds  Medication Sig   acetaminophen (TYLENOL) 325 MG tablet Take 2 tablets (650 mg total) by mouth every 6 (six) hours as needed for mild pain, fever or headache.   amLODipine (NORVASC) 5 MG tablet Take 2 tablets (10 mg total) by mouth daily.   aspirin EC 81 MG tablet Take 81 mg by mouth daily. Swallow whole.   Aspirin-Salicylamide-Caffeine (ARTHRITIS STRENGTH BC POWDER PO) Take by mouth as needed.   atorvastatin (LIPITOR) 20 MG tablet Take 1 tablet (20 mg total) by mouth daily.   hydrALAZINE (APRESOLINE) 25 MG tablet Take 1 tablet (25 mg total) by mouth in the morning and at bedtime.   levETIRAcetam (KEPPRA) 500 MG tablet Take 1 tablet (500 mg total) by mouth 2 (two) times daily.   [DISCONTINUED] isosorbide mononitrate (IMDUR) 60  MG 24 hr tablet Take 1 tablet (60 mg total) by mouth daily.   [DISCONTINUED] lisinopril (ZESTRIL) 40 MG tablet Take 1 tablet (40 mg total) by mouth daily.     Allergies:   Patient has no known allergies.   Social History   Socioeconomic History   Marital status: Married    Spouse name: Not on file   Number of children: 3   Years of education: some college   Highest education level: Not on file  Occupational History   Occupation: Firefighter  Tobacco Use   Smoking status: Light Smoker    Types: Cigarettes   Smokeless tobacco: Never   Tobacco comments:     social smoker when having a beer.   Vaping Use   Vaping Use: Never used  Substance and Sexual Activity   Alcohol use: Yes    Comment: social - at most 5 beers per month   Drug use: No   Sexual activity: Not on file  Other Topics Concern   Not on file  Social History Narrative   Lives with family.   Right-handed.   3 biological children, 3 stepchildren   Caffeine use: 2 cups per day.   Social Determinants of Health   Financial Resource Strain: Not on file  Food Insecurity: Not on file  Transportation Needs: Not on file  Physical Activity: Not on file  Stress: Not on file  Social Connections: Not on file     Family History: The patient's family history includes Diabetes in his mother; Heart attack (age of onset: 39) in his father; Hypertension in his mother.  ROS:   Please see the history of present illness.     All other systems reviewed and are negative.  EKGs/Labs/Other Studies Reviewed:    The following studies were reviewed today:    Cardiac CTA 06/2020 IMPRESSION: 1. Coronary calcium score of 0. Patient is low risk for coronary events   2. Normal coronary origin with right dominance.   3. No evidence of CAD.   4. CAD-RADS 0. Consider non-atherosclerotic causes of chest pain.   Electronically Signed: By: Kate Sable M.D. On: 07/20/2020 16:51   Echo 06/13/20  1. Left ventricular ejection fraction, by estimation, is 55 to 60%. The  left ventricle has normal function. Left ventricular endocardial border  not optimally defined to evaluate regional wall motion. There is moderate  left ventricular hypertrophy. Left  ventricular diastolic parameters are consistent with Grade I diastolic  dysfunction (impaired relaxation).   2. Right ventricular systolic function is normal. The right ventricular  size is mildly enlarged. Tricuspid regurgitation signal is inadequate for  assessing PA pressure.   3. The mitral valve is normal in structure. No  evidence of mitral valve  regurgitation. No evidence of mitral stenosis.   4. The aortic valve has an indeterminant number of cusps. Aortic valve  regurgitation is not visualized. No aortic stenosis is present.   5. Aortic dilatation noted. There is mild dilatation of the aortic root,  measuring 40 mm.   6. Mildly dilated pulmonary artery.    Carotid duplex 06/13/20 Summary:  Right Carotid: There was no evidence of thrombus, dissection,  atherosclerotic                plaque or stenosis in the cervical carotid system.   Left Carotid: There was no evidence of thrombus, dissection,  atherosclerotic               plaque or stenosis in  the cervical carotid system.   Vertebrals:  Bilateral vertebral arteries demonstrate antegrade flow.  Subclavians: Normal flow hemodynamics were seen in bilateral subclavian               arteries.     EKG:  EKG is  ordered today.  The ekg ordered today demonstrates SR, 97bpm, LVH, nonspecific T wave changes  Recent Labs: 05/29/2021: ALT 15; BUN 13; Creatinine, Ser 1.16; Hemoglobin 13.3; Platelets 271; Potassium 3.8; Sodium 143  Recent Lipid Panel    Component Value Date/Time   CHOL 235 (H) 05/31/2020 1113   TRIG 79 05/31/2020 1113   HDL 39 (L) 05/31/2020 1113   CHOLHDL 6.0 (H) 05/31/2020 1113   LDLCALC 178 (H) 05/31/2020 1113     Physical Exam:    VS:  BP (!) 160/90 (BP Location: Left Arm, Patient Position: Sitting, Cuff Size: Large)   Pulse 97   Ht 5' 8.5" (1.74 m)   Wt 278 lb (126.1 kg)   SpO2 96%   BMI 41.65 kg/m     Wt Readings from Last 3 Encounters:  06/25/21 278 lb (126.1 kg)  05/29/21 273 lb 8 oz (124.1 kg)  09/29/20 268 lb 4.8 oz (121.7 kg)     GEN:  Well nourished, well developed in no acute distress HEENT: Normal NECK: No JVD; No carotid bruits LYMPHATICS: No lymphadenopathy CARDIAC: RRR, no murmurs, rubs, gallops RESPIRATORY:  Clear to auscultation without rales, wheezing or rhonchi  ABDOMEN: Soft, non-tender,  non-distended MUSCULOSKELETAL:  No edema; No deformity  SKIN: Warm and dry NEUROLOGIC:  Alert and oriented x 3 PSYCHIATRIC:  Normal affect   ASSESSMENT:    1. Essential hypertension   2. Seizure disorder as sequela of cerebrovascular accident ([place])   3. Morbid obesity ([place])   4. Mild dilation of ascending aorta ([facility])   5. Sleep disorder    PLAN:    In order of problems listed above:  Mild dilation aortic root Mild aneurysm dilation of the ascending thoracic aorta measuring 4.2cm in greatest estimated diameter. Recommend repeat imaging in a year. Need good BP control.   HTN BP still high despite increase in Imdur to 60mg  daily at the last visit. He reported he had meds this morning. He says BP at home is generally very high, around 180/100. For some reason he stopped lisinopril, unsure as to why, I will restart this. Increase Imdur to 90mg  daily. Continue to take BP at home. Will need to confirm with wife what BP meds he is on.   Suspected OSA Patient needs to follow-up with PCP regarding machine.   Seizure disorder Controlled on medications  Morbid obesity Recommend weight loss/lifestyle changes  Disposition: Follow up in 1 month(s) with MD/APP   Signed, Desani Sprung Ninfa Meeker, PA-C  06/25/2021 4:19 PM    [facility]

## 2021-06-25 NOTE — H&P (Signed)
  NAME: Jake Kirby MRN:   [id number] DOB:   1965-11-02     HISTORY AND PHYSICAL  CHIEF COMPLAINT:  right hip pain  HISTORY:   Jake Kirby a 55 y.o. male  with right  Hip Pain Patient complains of right hip pain. Onset of the symptoms was several years ago. Inciting event: known DJD. The patient reports the hip pain is worse with weight bearing. Associated symptoms: none. Aggravating symptoms include: any weight bearing and going up and down stairs. Patient has had prior hip problems. Previous visits for this problem: multiple, this is a longstanding diagnosis. Last seen several weeks ago by Dr. Harlow Mares . Evaluation to date: plain films, which were abnormal  osteoarthritis . Treatment to date: OTC analgesics, which have been somewhat effective, prescription analgesics, which have been somewhat effective, home exercise program, which has been somewhat effective, and physical therapy, which has been somewhat effective.    Plan for right total hip replacement  PAST MEDICAL HISTORY:   Past Medical History:  Diagnosis Date   Hypertension    Seizure ([place])    Stroke ([place])     PAST SURGICAL HISTORY:   Past Surgical History:  Procedure Laterality Date   BACK SURGERY     COLONOSCOPY WITH PROPOFOL N/A 02/12/2019   Procedure: COLONOSCOPY WITH PROPOFOL;  Surgeon: Virgel Manifold, MD;  Location: [facility] ENDOSCOPY;  Service: Endoscopy;  Laterality: N/A;    MEDICATIONS:  (Not in a hospital admission)   ALLERGIES:  No Known Allergies  REVIEW OF SYSTEMS:   Negative except HPI  FAMILY HISTORY:   Family History  Problem Relation Age of Onset   Hypertension Mother    Diabetes Mother    Heart attack Father 39    SOCIAL HISTORY:   reports that he has been smoking cigarettes. He has never used smokeless tobacco. He reports current alcohol use. He reports that he does not use drugs.  PHYSICAL EXAM:  General appearance: alert, cooperative, and no distress Neck: no JVD and supple, symmetrical,  trachea midline Resp: clear to auscultation bilaterally Cardio: regular rate and rhythm, S1, S2 normal, no murmur, click, rub or gallop GI: soft, non-tender; bowel sounds normal; no masses,  no organomegaly Extremities: extremities normal, atraumatic, no cyanosis or edema and Homans sign is negative, no sign of DVT Pulses: 2+ and symmetric Skin: Skin color, texture, turgor normal. No rashes or lesions Neurologic: Alert and oriented X 3, normal strength and tone. Normal symmetric reflexes. Normal coordination and gait    LABORATORY STUDIES: No results for input(s): WBC, HGB, HCT, PLT in the last 72 hours.  No results for input(s): NA, K, CL, CO2, GLUCOSE, BUN, CREATININE, CALCIUM in the last 72 hours.  STUDIES/RESULTS:  No results found.  ASSESSMENT:  End stage osteoarthritis right hip        Active Problems:   * No active hospital problems. *    PLAN:  Right Primary Total Hip   Carlynn Spry 06/25/2021. 3:59 PM

## 2021-07-04 ENCOUNTER — Encounter
Admission: RE | Admit: 2021-07-04 | Discharge: 2021-07-04 | Disposition: A | Discharge status: Home / Self Care | Payer: Commercial Managed Care - PPO | Source: Ambulatory Visit | Attending: Orthopedic Surgery | Admitting: Orthopedic Surgery

## 2021-07-04 ENCOUNTER — Other Ambulatory Visit: Payer: Self-pay

## 2021-07-04 VITALS — BP 160/98 | HR 94 | Resp 18 | Ht 68.5 in | Wt 275.8 lb

## 2021-07-04 DIAGNOSIS — Z01812 Encounter for preprocedural laboratory examination: Secondary | ICD-10-CM | POA: Diagnosis not present

## 2021-07-04 DIAGNOSIS — Z01818 Encounter for other preprocedural examination: Secondary | ICD-10-CM

## 2021-07-04 HISTORY — DX: Headache, unspecified: R51.9

## 2021-07-04 HISTORY — DX: Unspecified osteoarthritis, unspecified site: M19.90

## 2021-07-04 HISTORY — DX: Sleep apnea, unspecified: G47.30

## 2021-07-04 HISTORY — DX: Depression, unspecified: F32.A

## 2021-07-04 LAB — URINALYSIS, ROUTINE W REFLEX MICROSCOPIC
Bilirubin Urine: NEGATIVE
Glucose, UA: NEGATIVE mg/dL
Hgb urine dipstick: NEGATIVE
Ketones, ur: NEGATIVE mg/dL
Leukocytes,Ua: NEGATIVE
Nitrite: NEGATIVE
Specific Gravity, Urine: 1.02 (ref 1.005–1.030)
pH: 6 (ref 5.0–8.0)

## 2021-07-04 LAB — BASIC METABOLIC PANEL
Anion gap: 5 (ref 5–15)
BUN: 17 mg/dL (ref 6–20)
CO2: 33 mmol/L — ABNORMAL HIGH (ref 22–32)
Calcium: 9.2 mg/dL (ref 8.9–10.3)
Chloride: 102 mmol/L (ref 98–111)
Creatinine, Ser: 1.07 mg/dL (ref 0.61–1.24)
GFR, Estimated: >60 mL/min (ref >60)
Glucose, Bld: 120 mg/dL — ABNORMAL HIGH (ref 70–99)
Potassium: 3.2 mmol/L — ABNORMAL LOW (ref 3.5–5.1)
Sodium: 140 mmol/L (ref 135–145)

## 2021-07-04 LAB — PROTIME-INR
INR: 1 (ref 0.8–1.2)
Prothrombin Time: 13 seconds (ref 11.4–15.2)

## 2021-07-04 LAB — TYPE AND SCREEN
ABO/RH(D): O POS
Antibody Screen: NEGATIVE

## 2021-07-04 LAB — CBC
HCT: 45.7 % (ref 39.0–52.0)
Hemoglobin: 14.3 g/dL (ref 13.0–17.0)
MCH: 26.8 pg (ref 26.0–34.0)
MCHC: 31.3 g/dL (ref 30.0–36.0)
MCV: 85.7 fL (ref 80.0–100.0)
Platelets: 290 10*3/uL (ref 150–400)
RBC: 5.33 MIL/uL (ref 4.22–5.81)
RDW: 13.4 % (ref 11.5–15.5)
WBC: 5.9 10*3/uL (ref 4.0–10.5)
nRBC: 0 % (ref 0.0–0.2)

## 2021-07-04 LAB — APTT: aPTT: 31 seconds (ref 24–36)

## 2021-07-04 LAB — SURGICAL PCR SCREEN
MRSA, PCR: NEGATIVE
Staphylococcus aureus: NEGATIVE

## 2021-07-04 NOTE — Patient Instructions (Addendum)
Your procedure is scheduled on:07-18-21 Wednesday Report to the Registration Desk on the 1st floor of the [facility].Then proceed to the 2nd floor Surgery Desk in the [facility] To find out your arrival time, please call [phone] between 1PM - 3PM on:07-17-21 Tuesday  REMEMBER: Instructions that are not followed completely may result in serious medical risk, up to and including death; or upon the discretion of your surgeon and anesthesiologist your surgery may need to be rescheduled.  Do not eat food after midnight the night before surgery.  No gum chewing, lozengers or hard candies.  You may however, drink CLEAR liquids up to 2 hours before you are scheduled to arrive for your surgery. Do not drink anything within 2 hours of your scheduled arrival time.  Clear liquids include: - water  - apple juice without pulp - gatorade (not RED, PURPLE, OR BLUE) - black coffee or tea (Do NOT add milk or creamers to the coffee or tea) Do NOT drink anything that is not on this list.  TAKE THESE MEDICATIONS THE MORNING OF SURGERY WITH A SIP OF WATER: -amLODipine (NORVASC)  -atorvastatin (LIPITOR) -hydrALAZINE (APRESOLINE)  -isosorbide mononitrate (IMDUR)  -levETIRAcetam (KEPPRA)   Call Dr Alvira Philips office today (07-04-21) to find out when you need to stop you Aspirin  One week prior to surgery: Stop Anti-inflammatories (NSAIDS) such as Advil, Aleve, Ibuprofen, Motrin, Naproxen, Naprosyn and Aspirin based products such as Excedrin, Goodys Powder, BC Powder.You may however, take Tylenol if needed for pain up until the day of surgery.  Stop ANY OVER THE COUNTER supplements/vitamins 7 days prior to surgery   No Alcohol for 24 hours before or after surgery.  No Smoking including e-cigarettes for 24 hours prior to surgery.  No chewable tobacco products for at least 6 hours prior to surgery.  No nicotine patches on the day of surgery.  Do not use any "recreational" drugs for at least a week  prior to your surgery.  Please be advised that the combination of cocaine and anesthesia may have negative outcomes, up to and including death. If you test positive for cocaine, your surgery will be cancelled.  On the morning of surgery brush your teeth with toothpaste and water, you may rinse your mouth with mouthwash if you wish. Do not swallow any toothpaste or mouthwash.  Use CHG Soap as directed on instruction sheet.  Do not wear jewelry, make-up, hairpins, clips or nail polish.  Do not wear lotions, powders, or perfumes.   Do not shave body from the neck down 48 hours prior to surgery just in case you cut yourself which could leave a site for infection.  Also, freshly shaved skin may become irritated if using the CHG soap.  Contact lenses, hearing aids and dentures may not be worn into surgery.  Do not bring valuables to the hospital. [facility] is not responsible for any missing/lost belongings or valuables. .   Notify your doctor if there is any change in your medical condition (cold, fever, infection).  Wear comfortable clothing (specific to your surgery type) to the hospital.  After surgery, you can help prevent lung complications by doing breathing exercises.  Take deep breaths and cough every 1-2 hours. Your doctor may order a device called an Incentive Spirometer to help you take deep breaths. When coughing or sneezing, hold a pillow firmly against your incision with both hands. This is called splinting. Doing this helps protect your incision. It also decreases belly discomfort.  If you are  being admitted to the hospital overnight, leave your suitcase in the car. After surgery it may be brought to your room.  If you are being discharged the day of surgery, you will not be allowed to drive home. You will need a responsible adult (18 years or older) to drive you home and stay with you that night.   If you are taking public transportation, you will need to have a  responsible adult (18 years or older) with you. Please confirm with your physician that it is acceptable to use public transportation.   Please call the [facility] at [phone] if you have any questions about these instructions.  Surgery Visitation Policy:  Patients undergoing a surgery or procedure may have one family member or support person with them as long as that person is not COVID-19 positive or experiencing its symptoms.  That person may remain in the waiting area during the procedure and may rotate out with other people.  Inpatient Visitation:    Visiting hours are 7 a.m. to 8 p.m. Up to two visitors ages 16+ are allowed at one time in a patient room. The visitors may rotate out with other people during the day. Visitors must check out when they leave, or other visitors will not be allowed. One designated support person may remain overnight. The visitor must pass COVID-19 screenings, use hand sanitizer when entering and exiting the patients room and wear a mask at all times, including in the patients room. Patients must also wear a mask when staff or their visitor are in the room. Masking is required regardless of vaccination status.   Come To Pre-admit Testing on 07-17-21 Tuesday between 8am-12pm for Covid Testing

## 2021-07-13 ENCOUNTER — Other Ambulatory Visit: Admission: RE | Admit: 2021-07-13 | Payer: Commercial Managed Care - PPO | Source: Ambulatory Visit

## 2021-07-17 ENCOUNTER — Other Ambulatory Visit
Admission: RE | Admit: 2021-07-17 | Discharge: 2021-07-17 | Disposition: A | Discharge status: Home / Self Care | Payer: Commercial Managed Care - PPO | Source: Ambulatory Visit | Attending: Orthopedic Surgery | Admitting: Orthopedic Surgery

## 2021-07-17 ENCOUNTER — Other Ambulatory Visit: Payer: Self-pay

## 2021-07-17 DIAGNOSIS — Z20822 Contact with and (suspected) exposure to covid-19: Secondary | ICD-10-CM | POA: Insufficient documentation

## 2021-07-17 DIAGNOSIS — Z01812 Encounter for preprocedural laboratory examination: Secondary | ICD-10-CM | POA: Diagnosis not present

## 2021-07-17 LAB — SARS CORONAVIRUS 2 (TAT 6-24 HRS): SARS Coronavirus 2: NEGATIVE

## 2021-07-18 ENCOUNTER — Encounter: Payer: Self-pay | Admitting: Orthopedic Surgery

## 2021-07-18 ENCOUNTER — Ambulatory Visit: Payer: Commercial Managed Care - PPO | Admitting: Certified Registered Nurse Anesthetist

## 2021-07-18 ENCOUNTER — Other Ambulatory Visit: Payer: Self-pay

## 2021-07-18 ENCOUNTER — Encounter
Admission: RE | Disposition: A | Discharge status: Home / Self Care | Payer: Self-pay | Source: Home / Self Care | Attending: Orthopedic Surgery

## 2021-07-18 ENCOUNTER — Observation Stay
Admission: RE | Admit: 2021-07-18 | Discharge: 2021-07-19 | Disposition: A | Discharge status: Home / Self Care | Payer: Commercial Managed Care - PPO | Attending: Orthopedic Surgery | Admitting: Orthopedic Surgery

## 2021-07-18 ENCOUNTER — Ambulatory Visit: Payer: Commercial Managed Care - PPO

## 2021-07-18 DIAGNOSIS — Z96641 Presence of right artificial hip joint: Secondary | ICD-10-CM

## 2021-07-18 DIAGNOSIS — M1611 Unilateral primary osteoarthritis, right hip: Secondary | ICD-10-CM | POA: Diagnosis present

## 2021-07-18 DIAGNOSIS — Z8673 Personal history of transient ischemic attack (TIA), and cerebral infarction without residual deficits: Secondary | ICD-10-CM | POA: Diagnosis not present

## 2021-07-18 DIAGNOSIS — I10 Essential (primary) hypertension: Secondary | ICD-10-CM | POA: Insufficient documentation

## 2021-07-18 DIAGNOSIS — Z419 Encounter for procedure for purposes other than remedying health state, unspecified: Secondary | ICD-10-CM

## 2021-07-18 HISTORY — PX: TOTAL HIP ARTHROPLASTY: SHX124

## 2021-07-18 LAB — ABO/RH: ABO/RH(D): O POS

## 2021-07-18 SURGERY — ARTHROPLASTY, HIP, TOTAL, ANTERIOR APPROACH
Anesthesia: General | Site: Hip | Laterality: Right

## 2021-07-18 MED ORDER — PHENYLEPHRINE HCL-NACL 20-0.9 MG/250ML-% IV SOLN
INTRAVENOUS | Status: AC
  Filled 2021-07-18: qty 250

## 2021-07-18 MED ORDER — HYDROMORPHONE HCL 1 MG/ML IJ SOLN
0.5000 mg | INTRAMUSCULAR | Status: DC | PRN
  Administered 2021-07-18: 14:00:00 0.5 mg via INTRAVENOUS

## 2021-07-18 MED ORDER — OXYCODONE HCL 5 MG/5ML PO SOLN: 5.0000 mg | Freq: Once | ORAL | Status: AC | PRN

## 2021-07-18 MED ORDER — MIDAZOLAM HCL 5 MG/5ML IJ SOLN
INTRAMUSCULAR | Status: DC | PRN
  Administered 2021-07-18: 2 mg via INTRAVENOUS

## 2021-07-18 MED ORDER — FENTANYL CITRATE (PF) 100 MCG/2ML IJ SOLN
INTRAMUSCULAR | Status: AC
  Filled 2021-07-18: qty 2

## 2021-07-18 MED ORDER — ATORVASTATIN CALCIUM 20 MG PO TABS
20.0000 mg | ORAL_TABLET | Freq: Every day | ORAL | Status: DC
  Administered 2021-07-18 – 2021-07-19 (×2): 20 mg via ORAL
  Filled 2021-07-18 (×2): qty 1

## 2021-07-18 MED ORDER — METOCLOPRAMIDE HCL 5 MG PO TABS
5.0000 mg | ORAL_TABLET | Freq: Three times a day (TID) | ORAL | Status: DC | PRN
  Filled 2021-07-18: qty 2

## 2021-07-18 MED ORDER — ISOSORBIDE MONONITRATE ER 30 MG PO TB24
90.0000 mg | ORAL_TABLET | Freq: Every day | ORAL | Status: DC
  Administered 2021-07-18 – 2021-07-19 (×2): 90 mg via ORAL
  Filled 2021-07-18: qty 1
  Filled 2021-07-18: qty 3

## 2021-07-18 MED ORDER — BISACODYL 10 MG RE SUPP
10.0000 mg | Freq: Every day | RECTAL | Status: DC | PRN
  Filled 2021-07-18: qty 1

## 2021-07-18 MED ORDER — ONDANSETRON HCL 4 MG PO TABS
4.0000 mg | ORAL_TABLET | Freq: Four times a day (QID) | ORAL | Status: DC | PRN
  Filled 2021-07-18: qty 1

## 2021-07-18 MED ORDER — PHENOL 1.4 % MT LIQD
1.0000 | OROMUCOSAL | Status: DC | PRN
  Filled 2021-07-18: qty 177

## 2021-07-18 MED ORDER — HYDRALAZINE HCL 25 MG PO TABS
25.0000 mg | ORAL_TABLET | Freq: Two times a day (BID) | ORAL | Status: DC
  Administered 2021-07-18 – 2021-07-19 (×3): 25 mg via ORAL
  Filled 2021-07-18 (×4): qty 1

## 2021-07-18 MED ORDER — DEXMEDETOMIDINE HCL IN NACL 200 MCG/50ML IV SOLN
INTRAVENOUS | Status: AC
  Filled 2021-07-18: qty 50

## 2021-07-18 MED ORDER — PROPOFOL 10 MG/ML IV BOLUS
INTRAVENOUS | Status: DC | PRN
  Administered 2021-07-18 (×2): 20 mg via INTRAVENOUS
  Administered 2021-07-18: 30 mg via INTRAVENOUS

## 2021-07-18 MED ORDER — BUPIVACAINE-EPINEPHRINE (PF) 0.25% -1:200000 IJ SOLN
INTRAMUSCULAR | Status: DC | PRN
  Administered 2021-07-18: 30 mL

## 2021-07-18 MED ORDER — PHENYLEPHRINE HCL-NACL 20-0.9 MG/250ML-% IV SOLN
INTRAVENOUS | Status: DC | PRN
Start: 2021-07-18 — End: 2021-07-18
  Administered 2021-07-18: 50 ug/min via INTRAVENOUS

## 2021-07-18 MED ORDER — PROPOFOL 500 MG/50ML IV EMUL
INTRAVENOUS | Status: AC
  Filled 2021-07-18: qty 50

## 2021-07-18 MED ORDER — PHENYLEPHRINE HCL (PRESSORS) 10 MG/ML IV SOLN
INTRAVENOUS | Status: DC | PRN
  Administered 2021-07-18: 80 ug via INTRAVENOUS

## 2021-07-18 MED ORDER — CHLORHEXIDINE GLUCONATE 0.12 % MT SOLN: 15.0000 mL | Freq: Once | OROMUCOSAL | Status: AC

## 2021-07-18 MED ORDER — CEFAZOLIN SODIUM-DEXTROSE 2-4 GM/100ML-% IV SOLN
2.0000 g | INTRAVENOUS | Status: AC
  Administered 2021-07-18: 10:00:00 2 g via INTRAVENOUS

## 2021-07-18 MED ORDER — BUPIVACAINE HCL (PF) 0.5 % IJ SOLN
INTRAMUSCULAR | Status: DC | PRN
  Administered 2021-07-18: 3 mL

## 2021-07-18 MED ORDER — METOCLOPRAMIDE HCL 5 MG/ML IJ SOLN: 5.0000 mg | Freq: Three times a day (TID) | INTRAMUSCULAR | Status: DC | PRN

## 2021-07-18 MED ORDER — MAGNESIUM HYDROXIDE 400 MG/5ML PO SUSP: 30.0000 mL | Freq: Every day | ORAL | Status: DC | PRN

## 2021-07-18 MED ORDER — DOCUSATE SODIUM 100 MG PO CAPS
100.0000 mg | ORAL_CAPSULE | Freq: Two times a day (BID) | ORAL | Status: DC
  Administered 2021-07-18 – 2021-07-19 (×2): 100 mg via ORAL
  Filled 2021-07-18 (×2): qty 1

## 2021-07-18 MED ORDER — FAMOTIDINE 20 MG PO TABS
ORAL_TABLET | ORAL | Status: AC
  Administered 2021-07-18: 08:00:00 20 mg via ORAL
  Filled 2021-07-18: qty 1

## 2021-07-18 MED ORDER — LISINOPRIL 20 MG PO TABS
40.0000 mg | ORAL_TABLET | Freq: Every day | ORAL | Status: DC
  Administered 2021-07-18 – 2021-07-19 (×2): 40 mg via ORAL
  Filled 2021-07-18 (×3): qty 2

## 2021-07-18 MED ORDER — ORAL CARE MOUTH RINSE: 15.0000 mL | Freq: Once | OROMUCOSAL | Status: AC

## 2021-07-18 MED ORDER — CEFAZOLIN SODIUM-DEXTROSE 2-4 GM/100ML-% IV SOLN
INTRAVENOUS | Status: AC
  Filled 2021-07-18: qty 100

## 2021-07-18 MED ORDER — KETAMINE HCL 50 MG/5ML IJ SOSY
PREFILLED_SYRINGE | INTRAMUSCULAR | Status: AC
  Filled 2021-07-18: qty 5

## 2021-07-18 MED ORDER — LACTATED RINGERS IV SOLN: INTRAVENOUS | Status: DC

## 2021-07-18 MED ORDER — 0.9 % SODIUM CHLORIDE (POUR BTL) OPTIME
TOPICAL | Status: DC | PRN
  Administered 2021-07-18: 10:00:00 500 mL

## 2021-07-18 MED ORDER — CEFAZOLIN SODIUM-DEXTROSE 2-4 GM/100ML-% IV SOLN
2.0000 g | Freq: Four times a day (QID) | INTRAVENOUS | Status: AC
  Administered 2021-07-18 (×2): 2 g via INTRAVENOUS
  Filled 2021-07-18 (×4): qty 100

## 2021-07-18 MED ORDER — KETAMINE HCL 10 MG/ML IJ SOLN
INTRAMUSCULAR | Status: DC | PRN
  Administered 2021-07-18: 30 mg via INTRAVENOUS
  Administered 2021-07-18: 20 mg via INTRAVENOUS

## 2021-07-18 MED ORDER — PRONTOSAN WOUND IRRIGATION OPTIME
TOPICAL | Status: DC | PRN
  Administered 2021-07-18: 1 via TOPICAL

## 2021-07-18 MED ORDER — MENTHOL 3 MG MT LOZG
1.0000 | LOZENGE | OROMUCOSAL | Status: DC | PRN
  Filled 2021-07-18: qty 9

## 2021-07-18 MED ORDER — ALUM & MAG HYDROXIDE-SIMETH 200-200-20 MG/5ML PO SUSP: 30.0000 mL | ORAL | Status: DC | PRN

## 2021-07-18 MED ORDER — POVIDONE-IODINE 10 % EX SWAB
2.0000 "application " | Freq: Once | CUTANEOUS | Status: AC
  Administered 2021-07-18: 2 via TOPICAL

## 2021-07-18 MED ORDER — BUPIVACAINE-EPINEPHRINE (PF) 0.25% -1:200000 IJ SOLN
INTRAMUSCULAR | Status: AC
  Filled 2021-07-18: qty 30

## 2021-07-18 MED ORDER — LEVETIRACETAM 500 MG PO TABS
500.0000 mg | ORAL_TABLET | Freq: Two times a day (BID) | ORAL | Status: DC
  Administered 2021-07-18 – 2021-07-19 (×3): 500 mg via ORAL
  Filled 2021-07-18 (×4): qty 1

## 2021-07-18 MED ORDER — OXYCODONE HCL 5 MG PO TABS
5.0000 mg | ORAL_TABLET | Freq: Once | ORAL | Status: AC | PRN
  Administered 2021-07-18: 13:00:00 5 mg via ORAL

## 2021-07-18 MED ORDER — TRANEXAMIC ACID-NACL 1000-0.7 MG/100ML-% IV SOLN
INTRAVENOUS | Status: AC
  Filled 2021-07-18: qty 100

## 2021-07-18 MED ORDER — FENTANYL CITRATE (PF) 100 MCG/2ML IJ SOLN
INTRAMUSCULAR | Status: AC
  Administered 2021-07-18: 13:00:00 25 ug via INTRAVENOUS
  Filled 2021-07-18: qty 2

## 2021-07-18 MED ORDER — SODIUM CHLORIDE 0.9 % IR SOLN
Status: DC | PRN
  Administered 2021-07-18: 3000 mL
  Administered 2021-07-18: 250 mL

## 2021-07-18 MED ORDER — METOPROLOL TARTRATE 5 MG/5ML IV SOLN
5.0000 mg | INTRAVENOUS | Status: DC | PRN
  Administered 2021-07-18: 09:00:00 5 mg via INTRAVENOUS

## 2021-07-18 MED ORDER — PROPOFOL 500 MG/50ML IV EMUL
INTRAVENOUS | Status: DC | PRN
  Administered 2021-07-18: 50 ug/kg/min via INTRAVENOUS

## 2021-07-18 MED ORDER — AMLODIPINE BESYLATE 10 MG PO TABS
10.0000 mg | ORAL_TABLET | Freq: Every day | ORAL | Status: DC
  Administered 2021-07-18 – 2021-07-19 (×2): 10 mg via ORAL
  Filled 2021-07-18 (×2): qty 1

## 2021-07-18 MED ORDER — DEXMEDETOMIDINE (PRECEDEX) IN NS 20 MCG/5ML (4 MCG/ML) IV SYRINGE
PREFILLED_SYRINGE | INTRAVENOUS | Status: DC | PRN
  Administered 2021-07-18: 8 ug via INTRAVENOUS
  Administered 2021-07-18 (×3): 4 ug via INTRAVENOUS

## 2021-07-18 MED ORDER — OXYCODONE HCL 5 MG PO TABS
ORAL_TABLET | ORAL | Status: AC
  Filled 2021-07-18: qty 1

## 2021-07-18 MED ORDER — GLYCOPYRROLATE 0.2 MG/ML IJ SOLN
INTRAMUSCULAR | Status: DC | PRN
  Administered 2021-07-18: .2 mg via INTRAVENOUS

## 2021-07-18 MED ORDER — CHLORHEXIDINE GLUCONATE 0.12 % MT SOLN
OROMUCOSAL | Status: AC
  Administered 2021-07-18: 08:00:00 15 mL via OROMUCOSAL
  Filled 2021-07-18: qty 15

## 2021-07-18 MED ORDER — KETOROLAC TROMETHAMINE 15 MG/ML IJ SOLN
INTRAMUSCULAR | Status: AC
  Administered 2021-07-18: 13:00:00 15 mg via INTRAVENOUS
  Filled 2021-07-18: qty 1

## 2021-07-18 MED ORDER — ONDANSETRON HCL 4 MG/2ML IJ SOLN: 4.0000 mg | Freq: Once | INTRAMUSCULAR | Status: DC | PRN

## 2021-07-18 MED ORDER — FAMOTIDINE 20 MG PO TABS: 20.0000 mg | ORAL_TABLET | Freq: Once | ORAL | Status: AC

## 2021-07-18 MED ORDER — EPHEDRINE SULFATE 50 MG/ML IJ SOLN
INTRAMUSCULAR | Status: DC | PRN
  Administered 2021-07-18 (×2): 5 mg via INTRAVENOUS

## 2021-07-18 MED ORDER — MIDAZOLAM HCL 2 MG/2ML IJ SOLN
INTRAMUSCULAR | Status: AC
  Filled 2021-07-18: qty 2

## 2021-07-18 MED ORDER — KETOROLAC TROMETHAMINE 15 MG/ML IJ SOLN
15.0000 mg | Freq: Four times a day (QID) | INTRAMUSCULAR | Status: AC
  Administered 2021-07-18 – 2021-07-19 (×3): 15 mg via INTRAVENOUS
  Filled 2021-07-18 (×4): qty 1

## 2021-07-18 MED ORDER — HYDROMORPHONE HCL 1 MG/ML IJ SOLN
INTRAMUSCULAR | Status: AC
  Filled 2021-07-18: qty 1

## 2021-07-18 MED ORDER — OXYCODONE HCL 5 MG PO TABS
10.0000 mg | ORAL_TABLET | ORAL | Status: DC | PRN
  Administered 2021-07-18: 16:00:00 15 mg via ORAL
  Administered 2021-07-19: 11:00:00 10 mg via ORAL
  Filled 2021-07-18: qty 3

## 2021-07-18 MED ORDER — ACETAMINOPHEN 10 MG/ML IV SOLN
INTRAVENOUS | Status: DC | PRN
  Administered 2021-07-18: 1000 mg via INTRAVENOUS

## 2021-07-18 MED ORDER — TRANEXAMIC ACID-NACL 1000-0.7 MG/100ML-% IV SOLN
1000.0000 mg | INTRAVENOUS | Status: AC
  Administered 2021-07-18: 10:00:00 1000 mg via INTRAVENOUS

## 2021-07-18 MED ORDER — ACETAMINOPHEN 10 MG/ML IV SOLN
INTRAVENOUS | Status: AC
  Filled 2021-07-18: qty 100

## 2021-07-18 MED ORDER — ACETAMINOPHEN 325 MG PO TABS
325.0000 mg | ORAL_TABLET | Freq: Four times a day (QID) | ORAL | Status: DC | PRN
  Administered 2021-07-19: 04:00:00 650 mg via ORAL
  Filled 2021-07-18: qty 2

## 2021-07-18 MED ORDER — ONDANSETRON HCL 4 MG/2ML IJ SOLN: 4.0000 mg | Freq: Four times a day (QID) | INTRAMUSCULAR | Status: DC | PRN

## 2021-07-18 MED ORDER — FENTANYL CITRATE (PF) 100 MCG/2ML IJ SOLN
25.0000 ug | INTRAMUSCULAR | Status: DC | PRN
  Administered 2021-07-18: 13:00:00 25 ug via INTRAVENOUS

## 2021-07-18 MED ORDER — ACETAMINOPHEN 10 MG/ML IV SOLN: 1000.0000 mg | Freq: Once | INTRAVENOUS | Status: DC | PRN

## 2021-07-18 MED ORDER — OXYCODONE HCL 5 MG PO TABS
5.0000 mg | ORAL_TABLET | ORAL | Status: DC | PRN
  Administered 2021-07-18: 20:00:00 10 mg via ORAL
  Filled 2021-07-18 (×2): qty 2

## 2021-07-18 MED ORDER — METOPROLOL TARTRATE 5 MG/5ML IV SOLN
INTRAVENOUS | Status: AC
  Filled 2021-07-18: qty 5

## 2021-07-18 MED ORDER — ASPIRIN 81 MG PO CHEW
81.0000 mg | CHEWABLE_TABLET | Freq: Two times a day (BID) | ORAL | Status: DC
  Administered 2021-07-18 – 2021-07-19 (×2): 81 mg via ORAL
  Filled 2021-07-18 (×2): qty 1

## 2021-07-18 SURGICAL SUPPLY — 56 items
BLADE SAGITTAL WIDE XTHICK NO (BLADE) ×3 IMPLANT
BNDG COHESIVE 4X5 TAN ST LF (GAUZE/BANDAGES/DRESSINGS) ×2 IMPLANT
BRUSH SCRUB EZ  4% CHG (MISCELLANEOUS) ×2
BRUSH SCRUB EZ 4% CHG (MISCELLANEOUS) ×1 IMPLANT
CHLORAPREP W/TINT 26 (MISCELLANEOUS) ×3 IMPLANT
COVER HOLE (Hips) ×2 IMPLANT
CUP R3 52MM (Hips) ×2 IMPLANT
DRAPE 3/4 80X56 (DRAPES) ×3 IMPLANT
DRAPE C-ARM 42X72 X-RAY (DRAPES) ×3 IMPLANT
DRAPE STERI IOBAN 125X83 (DRAPES) IMPLANT
DRAPE U-SHAPE 47X51 STRL (DRAPES) ×3 IMPLANT
DRSG AQUACEL AG ADV 3.5X10 (GAUZE/BANDAGES/DRESSINGS) ×2 IMPLANT
DRSG AQUACEL AG ADV 3.5X14 (GAUZE/BANDAGES/DRESSINGS) IMPLANT
ELECT REM PT RETURN 9FT ADLT (ELECTROSURGICAL) ×3
ELECTRODE REM PT RTRN 9FT ADLT (ELECTROSURGICAL) ×1 IMPLANT
GAUZE 4X4 16PLY ~~LOC~~+RFID DBL (SPONGE) ×3 IMPLANT
GAUZE XEROFORM 1X8 LF (GAUZE/BANDAGES/DRESSINGS) ×2 IMPLANT
GLOVE SURG ORTHO LTX SZ8 (GLOVE) ×6 IMPLANT
GLOVE SURG UNDER LTX SZ8 (GLOVE) ×3 IMPLANT
GOWN STRL REUS W/ TWL LRG LVL3 (GOWN DISPOSABLE) ×1 IMPLANT
GOWN STRL REUS W/ TWL XL LVL3 (GOWN DISPOSABLE) ×1 IMPLANT
GOWN STRL REUS W/TWL LRG LVL3 (GOWN DISPOSABLE) ×2
GOWN STRL REUS W/TWL XL LVL3 (GOWN DISPOSABLE) ×3
HEAD FEM KNEE TAPER 36MM XS-3 (Head) ×2 IMPLANT
HOLSTER ELECTROSUGICAL PENCIL (MISCELLANEOUS) ×3 IMPLANT
IV NS 250ML (IV SOLUTION) ×2
IV NS 250ML BAXH (IV SOLUTION) IMPLANT
IV NS IRRIG 3000ML ARTHROMATIC (IV SOLUTION) ×3 IMPLANT
KIT PATIENT CARE HANA TABLE (KITS) ×3 IMPLANT
KIT TURNOVER CYSTO (KITS) ×3 IMPLANT
LINER ACETAB 0 DEG (Liner) ×2 IMPLANT
MANIFOLD NEPTUNE II (INSTRUMENTS) ×3 IMPLANT
MAT ABSORB  FLUID 56X50 GRAY (MISCELLANEOUS) ×2
MAT ABSORB FLUID 56X50 GRAY (MISCELLANEOUS) ×1 IMPLANT
NDL SAFETY ECLIPSE 18X1.5 (NEEDLE) IMPLANT
NDL SPNL 20GX3.5 QUINCKE YW (NEEDLE) ×1 IMPLANT
NEEDLE HYPO 18GX1.5 SHARP (NEEDLE)
NEEDLE HYPO 22GX1.5 SAFETY (NEEDLE) ×3 IMPLANT
NEEDLE SPNL 20GX3.5 QUINCKE YW (NEEDLE) ×3 IMPLANT
PACK HIP PROSTHESIS (MISCELLANEOUS) ×3 IMPLANT
PADDING CAST BLEND 4X4 NS (MISCELLANEOUS) ×6 IMPLANT
PILLOW ABDUCTION MEDIUM (MISCELLANEOUS) ×3 IMPLANT
PULSAVAC PLUS IRRIG FAN TIP (DISPOSABLE) ×3
SCREW 6.5X25MM (Screw) ×2 IMPLANT
SOLUTION PRONTOSAN WOUND 350ML (IRRIGATION / IRRIGATOR) ×2 IMPLANT
SPONGE T-LAP 18X18 ~~LOC~~+RFID (SPONGE) ×12 IMPLANT
STAPLER SKIN PROX 35W (STAPLE) ×3 IMPLANT
STEM LATERAL COLLAR POLARSTEM (Stem) ×2 IMPLANT
SUT BONE WAX W31G (SUTURE) ×3 IMPLANT
SUT DVC 2 QUILL PDO  T11 36X36 (SUTURE) ×2
SUT DVC 2 QUILL PDO T11 36X36 (SUTURE) ×1 IMPLANT
SUT VIC AB 2-0 CT1 18 (SUTURE) ×3 IMPLANT
SYR 20ML LL LF (SYRINGE) ×3 IMPLANT
TIP FAN IRRIG PULSAVAC PLUS (DISPOSABLE) ×1 IMPLANT
WAND WEREWOLF FASTSEAL 6.0 (MISCELLANEOUS) ×2 IMPLANT
WATER STERILE IRR 500ML POUR (IV SOLUTION) ×3 IMPLANT

## 2021-07-18 NOTE — H&P (Signed)
The patient has been re-examined, and the chart reviewed, and there have been no interval changes to the documented history and physical.  Plan a right total hip today.  Anesthesia is not consulted regarding a peripheral nerve block for post-operative pain.  The risks, benefits, and alternatives have been discussed at length, and the patient is willing to proceed.    

## 2021-07-18 NOTE — Op Note (Signed)
07/18/2021  11:42 AM  PATIENT:  Jake Kirby   MRN: [id number]  PRE-OPERATIVE DIAGNOSIS:  Osteoarthritis right hip   POST-OPERATIVE DIAGNOSIS: Same  Procedure: Right Total Hip Replacement  Surgeon: Elyn Aquas. Harlow Mares, MD   Assist: Carlynn Spry, PA-C  Anesthesia: Spinal   EBL: 200 mL   Specimens: None   Drains: None   Components used: A size 2 lateral Polarstem Smith and Nephew, R3 size 52 mm shell, and a 36 mm -3 mm head    Description of the procedure in detail: After informed consent was obtained and the appropriate extremity marked in the pre-operative holding area, the patient was taken to the operating room and placed in the supine position on the fracture table. All pressure points were well padded and bilateral lower extremities were place in traction spars. The hip was prepped and draped in standard sterile fashion. A spinal anesthetic had been delivered by the anesthesia team. The skin and subcutaneous tissues were injected with a mixture of Marcaine with epinephrine for post-operative pain. A longitudinal incision approximately 10 cm in length was carried out from the anterior superior iliac spine to the greater trochanter. The tensor fascia was divided and blunt dissection was taken down to the level of the joint capsule. The lateral circumflex vessels were cauterized. Deep retractors were placed and a portion of the anterior capsule was excised. Using fluoroscopy the neck cut was planned and carried out with a sagittal saw. The head was passed from the field with use of a corkscrew and hip skid. Deep retractors were placed along the acetabulum and the degenerative labrum and large osteophytes were removed with a Rongeur. The cup was sequentially reamed to a size 52 mm. The wound was irrigated and using fluoroscopy the size 52 mm cup was impacted in to anatomic position. A single screw was placed followed by a threaded hole cover. The final liner was impacted in to position.  Attention was then turned to the proximal femur. The leg was placed in extension and external rotation. The canal was opened and sequentially broached to a size 2 lateral. The trial components were placed and the hip relocated. The components were found to be in good position using fluoroscopy. The hip was dislocated and the trial components removed. The final components were impacted in to position and the hip relocated. The final components were again check with fluoroscopy and found to be in good position. Hemostasis was achieved with electrocautery. The deep capsule was injected with Marcaine and epinephrine. The wound was irrigated with bacitracin laced normal saline and the tensor fascia closed with #2 Quill suture. The subcutaneous tissues were closed with 2-0 vicryl and staples for the skin. A sterile dressing was applied and an abduction pillow. Patient tolerated the procedure well and there were no apparent complication. Patient was taken to the recovery room in good condition.   Kurtis Bushman, MD

## 2021-07-18 NOTE — Anesthesia Postprocedure Evaluation (Signed)
Anesthesia Post Note  Patient: Jake Kirby  Procedure(s) Performed: TOTAL HIP ARTHROPLASTY ANTERIOR APPROACH (Right: Hip)  Patient location during evaluation: PACU Anesthesia Type: Spinal and General Level of consciousness: awake and alert Pain management: pain level controlled Vital Signs Assessment: post-procedure vital signs reviewed and stable Respiratory status: spontaneous breathing and respiratory function stable Cardiovascular status: blood pressure returned to baseline and stable Postop Assessment: spinal receding and no headache Anesthetic complications: no   No notable events documented.   Last Vitals:  Vitals:   07/18/21 1200 07/18/21 1215  BP: 131/86 137/83  Pulse: 79 76  Resp: 19 18  Temp:    SpO2: 96% 95%    Last Pain:  Vitals:   07/18/21 1215  TempSrc:   PainSc: 0-No pain                 Darrin Nipper

## 2021-07-18 NOTE — Anesthesia Procedure Notes (Signed)
Spinal  Patient location during procedure: OR Start time: 07/18/2021 9:35 AM End time: 07/18/2021 9:38 AM Reason for block: surgical anesthesia Staffing Performed: resident/CRNA  Anesthesiologist: Darrin Nipper, MD Resident/CRNA: Tollie Eth, CRNA Preanesthetic Checklist Completed: patient identified, IV checked, site marked, risks and benefits discussed, surgical consent, monitors and equipment checked, pre-op evaluation and timeout performed Spinal Block Patient position: sitting Prep: Betadine Patient monitoring: heart rate, continuous pulse ox, blood pressure and cardiac monitor Approach: midline Location: L4-5 Injection technique: single-shot Needle Needle type: Whitacre and Introducer  Needle gauge: 24 G Needle length: 9 cm Assessment Events: CSF return Additional Notes Negative paresthesia. Negative blood return. Positive free-flowing CSF. Expiration date of kit checked and confirmed. Patient tolerated procedure well, without complications.

## 2021-07-18 NOTE — Progress Notes (Signed)
Patient last BP checked after metoprolol was 168/90 with HR of 56 radially. Anesthesia team aware. Patient was taken to OR. Patient was alert and oriented,no signs of acute distress.

## 2021-07-18 NOTE — Anesthesia Preprocedure Evaluation (Addendum)
Anesthesia Evaluation  Patient identified by MRN, date of birth, ID band Patient awake    Reviewed: Allergy & Precautions, NPO status , Patient's Chart, lab work & pertinent test results  History of Anesthesia Complications Negative for: history of anesthetic complications  Airway Mallampati: IV   Neck ROM: Full    Dental   Missing molars x4:   Pulmonary sleep apnea , Current Smoker (social smoker, none in last month) and Patient abstained from smoking.,    Pulmonary exam normal breath sounds clear to auscultation       Cardiovascular hypertension (poorly controlled), Normal cardiovascular exam Rhythm:Regular Rate:Normal  Ascending thoracic aneurysm 4.2 cm  ECG 06/25/21:  SR, 97bpm, LVH, nonspecific T wave changes  Echo 06/13/20 1. Left ventricular ejection fraction, by estimation, is 55 to 60%. The left ventricle has normal function. Left ventricular endocardial border not optimally defined to evaluate regional wall motion. There is moderate  left ventricular hypertrophy. Left ventricular diastolic parameters are consistent with Grade I diastolic  dysfunction (impaired relaxation).  2. Right ventricular systolic function is normal. The right ventricular size is mildly enlarged. Tricuspid regurgitation signal is inadequate for assessing PA pressure.  3. The mitral valve is normal in structure. No evidence of mitral valve regurgitation. No evidence of mitral stenosis.  4. The aortic valve has an indeterminant number of cusps. Aortic valve regurgitation is not visualized. No aortic stenosis is present.  5. Aortic dilatation noted. There is mild dilatation of the aortic root, measuring 40 mm.  6. Mildly dilated pulmonary artery.   Neuro/Psych  Headaches, Seizures - (last sz 07/2020),  PSYCHIATRIC DISORDERS Depression S/p lumbar fusion CVA (04/2020, residual left foot and hand weakness)    GI/Hepatic negative GI ROS,    Endo/Other  Class 3 obesity  Renal/GU negative Renal ROS     Musculoskeletal  (+) Arthritis ,   Abdominal   Peds  Hematology negative hematology ROS (+)   Anesthesia Other Findings Reviewed 06/25/21 cardiology note  Reproductive/Obstetrics                            Anesthesia Physical Anesthesia Plan  ASA: 4  Anesthesia Plan: General and Spinal   Post-op Pain Management:    Induction: Intravenous  PONV Risk Score and Plan: 1 and Propofol infusion, TIVA, Treatment may vary due to age or medical condition and Ondansetron  Airway Management Planned: Natural Airway and Nasal Cannula  Additional Equipment:   Intra-op Plan:   Post-operative Plan:   Informed Consent: I have reviewed the patients History and Physical, chart, labs and discussed the procedure including the risks, benefits and alternatives for the proposed anesthesia with the patient or authorized representative who has indicated his/her understanding and acceptance.       Plan Discussed with: CRNA  Anesthesia Plan Comments: (Pt hypertensive in preop, 180s/120s.  Will treat with metoprolol, but may have to cancel if unable to lower.  Plan for spinal and GA with natural airway, LMA/GETA backup, which is at increased likelihood due to lumbar rods.  Patient consented for risks of anesthesia including but not limited to:  - adverse reactions to medications - damage to eyes, teeth, lips or other oral mucosa - nerve damage due to positioning  - sore throat or hoarseness - headache, bleeding, infection, nerve damage 2/2 spinal - damage to heart, brain, nerves, lungs, other parts of body or loss of life  Informed patient about role of CRNA in peri- and  intra-operative care.  Patient voiced understanding.)      Anesthesia Quick Evaluation

## 2021-07-18 NOTE — Evaluation (Signed)
Physical Therapy Evaluation Patient Details Name: Jake Kirby MRN: [id number] DOB: [birth year] Today's Date: 07/18/2021  History of Present Illness  Pt is a 55 yo M diagnosed with osteoarthritis right hip and is s/p elective R THA. PMH includes HTN and CVA.  Clinical Impression  Pt was pleasant and motivated to participate during the session and put forth good effort throughout.  Pt required no physical assistance with any functional task during the session and presented with good control and stability with transfers and gait.  Pt reported no adverse symptoms during the session other than moderate R hip pain and is expected to make very good progress towards goals while in acute care.  Pt will benefit from HHPT upon discharge to safely address deficits listed in patient problem list for decreased caregiver assistance and eventual return to PLOF.         Recommendations for follow up therapy are one component of a multi-disciplinary discharge planning process, led by the attending physician.  Recommendations may be updated based on patient status, additional functional criteria and insurance authorization.  Follow Up Recommendations Home health PT    Assistance Recommended at Discharge Intermittent Supervision/Assistance  Functional Status Assessment Patient has had a recent decline in their functional status and demonstrates the ability to make significant improvements in function in a reasonable and predictable amount of time.  Equipment Recommendations  Rolling walker (2 wheels);BSC/3in1    Recommendations for Other Services       Precautions / Restrictions Precautions Precautions: Anterior Hip Precaution Booklet Issued: Yes (comment) Restrictions Weight Bearing Restrictions: Yes RLE Weight Bearing: Weight bearing as tolerated Other Position/Activity Restrictions: RLE WBAT per phone conversation with Dr. Harlow Mares      Mobility  Bed Mobility Overal bed mobility: Modified  Independent             General bed mobility comments: Min extra time and effort only    Transfers Overall transfer level: Needs assistance Equipment used: Rolling walker (2 wheels) Transfers: Sit to/from Stand Sit to Stand: Min guard           General transfer comment: Min verbal cues for sequencing    Ambulation/Gait Ambulation/Gait assistance: Min guard Gait Distance (Feet): 5 Feet Assistive device: Rolling walker (2 wheels) Gait Pattern/deviations: Step-to pattern;Decreased stance time - right;Antalgic Gait velocity: decreased     General Gait Details: Pt steady with amb with no LOB or buckling  Stairs            Wheelchair Mobility    Modified Rankin (Stroke Patients Only)       Balance Overall balance assessment: Needs assistance   Sitting balance-Leahy Scale: Normal     Standing balance support: Bilateral upper extremity supported;During functional activity Standing balance-Leahy Scale: Good Standing balance comment: Minimal lean on RW for support                             Pertinent Vitals/Pain Pain Assessment: 0-10 Pain Score: 6  Pain Location: R hip Pain Descriptors / Indicators: Sore Pain Intervention(s): Repositioned;Premedicated before session;Monitored during session;Ice applied    Home Living Family/patient expects to be discharged to:: Private residence Living Arrangements: Spouse/significant other;Children Available Help at Discharge: Family;Available 24 hours/day Type of Home: House Home Access: Level entry       Home Layout: One level Home Equipment: Conservation officer, nature (2 wheels);BSC/3in1      Prior Function Prior Level of Function : Needs assist  Physical Assist : ADLs (physical)   ADLs (physical): Dressing Mobility Comments: Ind amb without AD in the home, [facility] PRN very limited community distances secondary to R hip pain.  Would typically use facility w/c for MD apts. No fall history. Works at [employer] on feet 50% of the time ADLs Comments: Ind with bathing and PRN assist with dressing     Hand Dominance        Extremity/Trunk Assessment   Upper Extremity Assessment Upper Extremity Assessment: Overall WFL for tasks assessed    Lower Extremity Assessment Lower Extremity Assessment: Generalized weakness;RLE deficits/detail RLE Deficits / Details: BLE ankle AROM and strength WNL; BLE sensation to light touch grossly intact and equal L/R RLE: Unable to fully assess due to pain RLE Sensation: WNL       Communication   Communication: No difficulties  Cognition Arousal/Alertness: Awake/alert Behavior During Therapy: WFL for tasks assessed/performed Overall Cognitive Status: Within Functional Limits for tasks assessed                                          General Comments      Exercises Total Joint Exercises Ankle Circles/Pumps: AROM;Strengthening;Both;10 reps Quad Sets: Strengthening;Both;10 reps Long Arc Quad: AROM;Strengthening;Both;10 reps Knee Flexion: AROM;Strengthening;Both;10 reps Marching in Standing: AROM;Strengthening;Both;10 reps;Standing Other Exercises Other Exercises: Anterior hip precaution education Other Exercises: HEP education and review per handout   Assessment/Plan    PT Assessment Patient needs continued PT services  PT Problem List Decreased strength;Decreased activity tolerance;Decreased balance;Decreased mobility;Decreased knowledge of use of DME;Decreased knowledge of precautions;Pain       PT Treatment Interventions DME instruction;Gait training;Functional mobility training;Therapeutic activities;Stair training;Therapeutic exercise;Balance training;Patient/family education    PT Goals (Current goals can be found in the Care Plan section)  Acute Rehab PT Goals Patient Stated Goal: To get back to work PT Goal Formulation: With patient Time For Goal Achievement: 07/31/21 Potential to Achieve Goals:  Good    Frequency BID   Barriers to discharge        Co-evaluation               AM-PAC PT "6 Clicks" Mobility  Outcome Measure Help needed turning from your back to your side while in a flat bed without using bedrails?: A Little Help needed moving from lying on your back to sitting on the side of a flat bed without using bedrails?: A Little Help needed moving to and from a bed to a chair (including a wheelchair)?: A Little Help needed standing up from a chair using your arms (e.g., wheelchair or bedside chair)?: A Little Help needed to walk in hospital room?: A Little Help needed climbing 3-5 steps with a railing? : A Lot 6 Click Score: 17    End of Session Equipment Utilized During Treatment: Gait belt Activity Tolerance: Patient tolerated treatment well Patient left: in chair;with call bell/phone within reach;with chair alarm set;with SCD's reapplied Nurse Communication: Mobility status;Weight bearing status PT Visit Diagnosis: Other abnormalities of gait and mobility (R26.89);Muscle weakness (generalized) (M62.81);Pain Pain - Right/Left: Right Pain - part of body: Hip    Time: 1625-1710 PT Time Calculation (min) (ACUTE ONLY): 45 min   Charges:   PT Evaluation $PT Eval Moderate Complexity: 1 Mod PT Treatments $Therapeutic Exercise: 8-22 mins $Therapeutic Activity: 8-22 mins        D. Royetta Asal PT, DPT 07/18/21, 5:37  PM

## 2021-07-18 NOTE — Transfer of Care (Signed)
Immediate Anesthesia Transfer of Care Note  Patient: Jake Kirby  Procedure(s) Performed: TOTAL HIP ARTHROPLASTY ANTERIOR APPROACH (Right: Hip)  Patient Location: PACU  Anesthesia Type:Spinal  Level of Consciousness: drowsy  Airway & Oxygen Therapy: Patient Spontanous Breathing and Patient connected to face mask oxygen  Post-op Assessment: Report given to RN and Post -op Vital signs reviewed and stable  Post vital signs: Reviewed and stable  Last Vitals:  Vitals Value Taken Time  BP 135/92 07/18/21 1136  Temp    Pulse 88 07/18/21 1138  Resp 18 07/18/21 1138  SpO2 99 % 07/18/21 1138  Vitals shown include unvalidated device data.  Last Pain:  Vitals:   07/18/21 0838  TempSrc:   PainSc: 10-Worst pain ever         Complications: No notable events documented.

## 2021-07-19 ENCOUNTER — Encounter: Payer: Self-pay | Admitting: Orthopedic Surgery

## 2021-07-19 DIAGNOSIS — M1611 Unilateral primary osteoarthritis, right hip: Secondary | ICD-10-CM | POA: Diagnosis not present

## 2021-07-19 LAB — BASIC METABOLIC PANEL
Anion gap: 8 (ref 5–15)
BUN: 20 mg/dL (ref 6–20)
CO2: 27 mmol/L (ref 22–32)
Calcium: 8.2 mg/dL — ABNORMAL LOW (ref 8.9–10.3)
Chloride: 103 mmol/L (ref 98–111)
Creatinine, Ser: 1.39 mg/dL — ABNORMAL HIGH (ref 0.61–1.24)
GFR, Estimated: >60 mL/min (ref >60)
Glucose, Bld: 151 mg/dL — ABNORMAL HIGH (ref 70–99)
Potassium: 3.5 mmol/L (ref 3.5–5.1)
Sodium: 138 mmol/L (ref 135–145)

## 2021-07-19 LAB — CBC
HCT: 37.1 % — ABNORMAL LOW (ref 39.0–52.0)
Hemoglobin: 11.5 g/dL — ABNORMAL LOW (ref 13.0–17.0)
MCH: 26.3 pg (ref 26.0–34.0)
MCHC: 31 g/dL (ref 30.0–36.0)
MCV: 84.9 fL (ref 80.0–100.0)
Platelets: 224 10*3/uL (ref 150–400)
RBC: 4.37 MIL/uL (ref 4.22–5.81)
RDW: 13.7 % (ref 11.5–15.5)
WBC: 10.9 10*3/uL — ABNORMAL HIGH (ref 4.0–10.5)
nRBC: 0 % (ref 0.0–0.2)

## 2021-07-19 LAB — SURGICAL PATHOLOGY

## 2021-07-19 MED ORDER — DOCUSATE SODIUM 100 MG PO CAPS: 100.0000 mg | ORAL_CAPSULE | Freq: Two times a day (BID) | ORAL | 0 refills | Status: DC

## 2021-07-19 MED ORDER — METHOCARBAMOL 500 MG PO TABS: 500.0000 mg | ORAL_TABLET | Freq: Three times a day (TID) | ORAL | 2 refills | Status: DC

## 2021-07-19 MED ORDER — OXYCODONE HCL 5 MG PO TABS: 5.0000 mg | ORAL_TABLET | ORAL | 0 refills | Status: DC | PRN

## 2021-07-19 MED ORDER — ASPIRIN 81 MG PO CHEW: 81.0000 mg | CHEWABLE_TABLET | Freq: Two times a day (BID) | ORAL | 0 refills | Status: DC

## 2021-07-19 NOTE — TOC Transition Note (Signed)
Transition of Care [facility] - CM/SW Discharge Note   Patient Details  Name: Jake Kirby MRN: [id number] Date of Birth: 21-Mar-1966  [facility] CM/SW Contact:  Candie Chroman, LCSW Phone Number: 07/19/2021, 8:39 AM   Clinical Narrative:   Patient has orders to discharge home today. This CSW working remote today. Called patient in the room, introduced role, and explained that PT recommendations would be discussed. He is not interested in home health at this time. He is planning on starting outpatient PT at [facility]. He thinks his appointment is scheduled for 1/5. Patient already has a RW at home and declined 3-in-1. No further concerns. CSW signing off.  Final next level of care: OP Rehab Barriers to Discharge: No Barriers Identified   Patient Goals and CMS Choice        Discharge Placement                    Patient and family notified of of transfer: 07/19/21  Discharge Plan and Services                                     Social Determinants of Health (SDOH) Interventions     Readmission Risk Interventions No flowsheet data found.

## 2021-07-19 NOTE — Progress Notes (Signed)
Discharge instructions reviewed with pt. He voiced understanding of instructions. Had no questions. IV removed and belongings returned to pt.  Pt is waiting on wife for transport

## 2021-07-19 NOTE — Discharge Summary (Signed)
Physician Discharge Summary  Patient ID: Jake Kirby MRN: [id number] DOB/AGE: [birth year] 54 y.o.  Admit date: 07/18/2021 Discharge date: 07/19/2021  Admission Diagnoses:  Unilateral primary osteoarthritis, right hip History of total hip replacement, right  Discharge Diagnoses:  Unilateral primary osteoarthritis, right hip Principal Problem:   History of total hip replacement, right   Past Medical History:  Diagnosis Date   Arthritis    Depression    Headache    Hypertension    uncontrolled   Seizure ([place])    last seizure was beginning of 2022   Sleep apnea    has not f/u about getting cpap   Stroke ([place]) 04/2020   has affected memory and left sided weakness/will occ drag left foot    Surgeries: Procedure(s): TOTAL HIP ARTHROPLASTY ANTERIOR APPROACH on 07/18/2021   Consultants (if any):   Discharged Condition: Improved  Hospital Course: CHESLEY VEASEY is an 55 y.o. male who was admitted 07/18/2021 with a diagnosis of  Unilateral primary osteoarthritis, right hip History of total hip replacement, right and went to the operating room on 07/18/2021 and underwent the above named procedures.    He was given perioperative antibiotics:  Anti-infectives (From admission, onward)    Start     Dose/Rate Route Frequency Ordered Stop   07/18/21 1600  ceFAZolin (ANCEF) IVPB 2g/100 mL premix        2 g 200 mL/hr over 30 Minutes Intravenous Every 6 hours 07/18/21 1254 07/18/21 2147   07/18/21 0745  ceFAZolin (ANCEF) 2-4 GM/100ML-% IVPB       Note to Pharmacy: Jordan Hawks H: cabinet override      07/18/21 0745 07/18/21 1005   07/18/21 0600  ceFAZolin (ANCEF) IVPB 2g/100 mL premix        2 g 200 mL/hr over 30 Minutes Intravenous On call to O.R. 07/18/21 1025 07/18/21 1010     .  He was given sequential compression devices, early ambulation, and aspirin for DVT prophylaxis.  He benefited maximally from the hospital stay and there were no complications.    Recent  vital signs:  Vitals:   07/19/21 0354 07/19/21 0500  BP: 126/76   Pulse: (!) 104   Resp: 17   Temp: (!) 100.4 F (38 C) 99.1 F (37.3 C)  SpO2: 90%     Recent laboratory studies:  Lab Results  Component Value Date   HGB 11.5 (L) 07/19/2021   HGB 14.3 07/04/2021   HGB 13.3 05/29/2021   Lab Results  Component Value Date   WBC 10.9 (H) 07/19/2021   PLT 224 07/19/2021   Lab Results  Component Value Date   INR 1.0 07/04/2021   Lab Results  Component Value Date   NA 138 07/19/2021   K 3.5 07/19/2021   CL 103 07/19/2021   CO2 27 07/19/2021   BUN 20 07/19/2021   CREATININE 1.39 (H) 07/19/2021   GLUCOSE 151 (H) 07/19/2021    Discharge Medications:   Allergies as of 07/19/2021   No Known Allergies      Medication List     STOP taking these medications    ARTHRITIS STRENGTH BC POWDER PO   aspirin EC 81 MG tablet Replaced by: aspirin 81 MG chewable tablet       TAKE these medications    amLODipine 5 MG tablet Commonly known as: NORVASC Take 2 tablets (10 mg total) by mouth daily. What changed: when to take this   aspirin 81 MG chewable tablet Chew 1 tablet (81  mg total) by mouth 2 (two) times daily. Replaces: aspirin EC 81 MG tablet   atorvastatin 20 MG tablet Commonly known as: LIPITOR Take 1 tablet (20 mg total) by mouth daily. What changed: when to take this   docusate sodium 100 MG capsule Commonly known as: COLACE Take 1 capsule (100 mg total) by mouth 2 (two) times daily.   hydrALAZINE 25 MG tablet Commonly known as: APRESOLINE Take 1 tablet (25 mg total) by mouth in the morning and at bedtime. What changed: when to take this   ibuprofen 800 MG tablet Commonly known as: ADVIL Take 800 mg by mouth every 8 (eight) hours as needed for moderate pain.   isosorbide mononitrate 60 MG 24 hr tablet Commonly known as: IMDUR Take 1.5 tablets (90 mg total) by mouth daily. What changed: when to take this   levETIRAcetam 500 MG tablet Commonly  known as: KEPPRA Take 1 tablet (500 mg total) by mouth 2 (two) times daily.   lisinopril 40 MG tablet Commonly known as: ZESTRIL Take 1 tablet (40 mg total) by mouth daily. What changed: when to take this   methocarbamol 500 MG tablet Commonly known as: Robaxin Take 1 tablet (500 mg total) by mouth 3 (three) times daily.   oxyCODONE 5 MG immediate release tablet Commonly known as: Oxy IR/ROXICODONE Take 1 tablet (5 mg total) by mouth every 4 (four) hours as needed for moderate pain (pain).               Durable Medical Equipment  (From admission, onward)           Start     Ordered   07/19/21 0650  For home use only DME 3 n 1  Once        07/19/21 0650   07/19/21 0650  For home use only DME Walker rolling  Once       Question Answer Comment  Walker: With [place] Wheels   Patient needs a walker to treat with the following condition Osteoarthritis of right hip      07/19/21 0650   07/18/21 1254  DME Walker rolling  Once       Question:  Patient needs a walker to treat with the following condition  Answer:  History of total hip replacement, right   07/18/21 1254   07/18/21 1254  DME 3 n 1  Once        07/18/21 1254   07/18/21 1254  DME Bedside commode  Once       Question:  Patient needs a bedside commode to treat with the following condition  Answer:  History of total hip replacement, right   07/18/21 1254            Diagnostic Studies: DG C-Arm 1-60 Min-No Report  Result Date: 07/18/2021 Fluoroscopy was utilized by the requesting physician.  No radiographic interpretation.   DG HIP UNILAT WITH PELVIS 1V RIGHT  Result Date: 07/18/2021 CLINICAL DATA:  Right hip replacement EXAM: DG HIP (WITH OR WITHOUT PELVIS) 1V RIGHT COMPARISON:  None. FINDINGS: Post right total hip arthroplasty. No evidence of complication on this study. IMPRESSION: Post right total hip arthroplasty. Electronically Signed   By: Macy Mis M.D.   On: 07/18/2021 12:46    Disposition:  Discharge disposition: 01-Home or Self Care            Signed: Carlynn Spry ,PA-C 07/19/2021, 6:51 AM

## 2021-07-19 NOTE — Progress Notes (Signed)
Subjective:  Patient reports pain as mild to moderate.    Objective:   VITALS:   Vitals:   07/18/21 1948 07/18/21 2335 07/19/21 0354 07/19/21 0500  BP: 133/78 123/67 126/76   Pulse: 98 (!) 102 (!) 104   Resp: $Remo'16 17 17   'njWSN$ Temp: 98.9 F (37.2 C) 100.3 F (37.9 C) (!) 100.4 F (38 C) 99.1 F (37.3 C)  TempSrc:    Oral  SpO2: 95% 97% 90%   Weight:      Height:        PHYSICAL EXAM:  Neurologically intact ABD soft Neurovascular intact Sensation intact distally Intact pulses distally Dorsiflexion/Plantar flexion intact Incision: scant drainage and drsg changed No cellulitis present Compartment soft  LABS  Results for orders placed or performed during the hospital encounter of 07/18/21 (from the past 24 hour(s))  ABO/Rh     Status: None   Collection Time: 07/18/21  7:54 AM  Result Value Ref Range   ABO/RH(D)      O POS Performed at [facility], [address] [place], [place] [address]   CBC     Status: Abnormal   Collection Time: 07/19/21  5:31 AM  Result Value Ref Range   WBC 10.9 (H) 4.0 - 10.5 K/uL   RBC 4.37 4.22 - 5.81 MIL/uL   Hemoglobin 11.5 (L) 13.0 - 17.0 g/dL   HCT 37.1 (L) 39.0 - 52.0 %   MCV 84.9 80.0 - 100.0 fL   MCH 26.3 26.0 - 34.0 pg   MCHC 31.0 30.0 - 36.0 g/dL   RDW 13.7 11.5 - 15.5 %   Platelets 224 150 - 400 K/uL   nRBC 0.0 0.0 - 0.2 %  Basic metabolic panel     Status: Abnormal   Collection Time: 07/19/21  5:31 AM  Result Value Ref Range   Sodium 138 135 - 145 mmol/L   Potassium 3.5 3.5 - 5.1 mmol/L   Chloride 103 98 - 111 mmol/L   CO2 27 22 - 32 mmol/L   Glucose, Bld 151 (H) 70 - 99 mg/dL   BUN 20 6 - 20 mg/dL   Creatinine, Ser 1.39 (H) 0.61 - 1.24 mg/dL   Calcium 8.2 (L) 8.9 - 10.3 mg/dL   GFR, Estimated >60 >60 mL/min   Anion gap 8 5 - 15    DG C-Arm 1-60 Min-No Report  Result Date: 07/18/2021 Fluoroscopy was utilized by the requesting physician.  No radiographic interpretation.   DG HIP UNILAT WITH PELVIS 1V  RIGHT  Result Date: 07/18/2021 CLINICAL DATA:  Right hip replacement EXAM: DG HIP (WITH OR WITHOUT PELVIS) 1V RIGHT COMPARISON:  None. FINDINGS: Post right total hip arthroplasty. No evidence of complication on this study. IMPRESSION: Post right total hip arthroplasty. Electronically Signed   By: Macy Mis M.D.   On: 07/18/2021 12:46    Assessment/Plan: 1 Day Post-Op   Principal Problem:   History of total hip replacement, right   Advance diet Up with therapy D/C home today once PT goals met   Carlynn Spry , PA-C 07/19/2021, 6:46 AM

## 2021-07-19 NOTE — Discharge Instructions (Signed)

## 2021-07-19 NOTE — Evaluation (Signed)
Occupational Therapy Evaluation Patient Details Name: Jake Kirby MRN: [id number] DOB: 11-27-1965 Today's Date: 07/19/2021   History of Present Illness Pt is a 55 yo M diagnosed with osteoarthritis right hip and is s/p elective R THA. PMH includes HTN and CVA.   Clinical Impression   Pt seen for OT evaluation this date in setting of acute hospitalization s/p R THA. Pt reports being INDEP For fxl mobility at baseline and requiring some assist from spouse for LB ADLs. Pt presents this date with some mild R hip pain, but in general reports that it actually feels better than his baseline. He currently requires MIN A for LB ADLs to thread socks, underwear and pants d/t limited hip ROM (both d/t precautions, but also some baseline decreased ROM r/t chronic pain and abdominal girth). Pt is able to meaningfully contribute to all LB ADLs and is educated on use of sock aide and reacher as needed for LB self care. He is able to perform all ADL transfers and fxl mobility w/ and w/o RW, demos good control, good tolerance for WB through R LE, and no sway. He is able to complete fxl mobility to/from restroom with no AD with increased time and completes standing voiding with G balance with occasional use of grab bar. Pt returned to EOB sitting end of session and PT presents for treatment. PT reports he intends to f/u with OP therapy which is appropriate to address his LB ROM as it pertains to ADL independence.      Recommendations for follow up therapy are one component of a multi-disciplinary discharge planning process, led by the attending physician.  Recommendations may be updated based on patient status, additional functional criteria and insurance authorization.   Follow Up Recommendations  Outpatient OT    Assistance Recommended at Discharge Set up Supervision/Assistance  Functional Status Assessment  Patient has had a recent decline in their functional status and demonstrates the ability to make  significant improvements in function in a reasonable and predictable amount of time.  Equipment Recommendations  Tub/shower seat;Other (comment) (reacher and sock aide)    Recommendations for Other Services       Precautions / Restrictions Precautions Precautions: Anterior Hip Precaution Booklet Issued: Yes (comment) Restrictions Weight Bearing Restrictions: Yes RLE Weight Bearing: Weight bearing as tolerated Other Position/Activity Restrictions: RLE WBAT per phone conversation with Dr. Harlow Mares      Mobility Bed Mobility Overal bed mobility: Modified Independent             General bed mobility comments: seated edge of bed beginning/end of treatment session    Transfers Overall transfer level: Modified independent  Equipment used: Rolling walker (2 wheels)/NONE Transfers: Sit to/from Stand  Sit to Stand: Modified independent (Device/Increase time)           General transfer comment: performed with and without RW     Balance Overall balance assessment: Modified Independent Sitting-balance support: No upper extremity supported;Feet supported Sitting balance-Leahy Scale: Normal     Standing balance support: Bilateral upper extremity supported Standing balance-Leahy Scale: Good                             ADL either performed or assessed with clinical judgement   ADL  General ADL Comments: MIN A For LB ADLs, MOD I for ADL transfers and mobility w/ or w/o RW     Vision Patient Visual Report: No change from baseline       Perception     Praxis      Pertinent Vitals/Pain Pain Assessment: Faces Pain Score: 6  Faces Pain Scale: Hurts a little bit Pain Location: R hip Pain Descriptors / Indicators: Sore Pain Intervention(s): Limited activity within patient's tolerance;Monitored during session;Premedicated before session;Repositioned     Hand Dominance     Extremity/Trunk Assessment  Upper Extremity Assessment Upper Extremity Assessment: Overall WFL for tasks assessed   Lower Extremity Assessment Lower Extremity Assessment: Generalized weakness;RLE deficits/detail;LLE deficits/detail RLE Deficits / Details: BLE ankle AROM and strength WFL, hip ROM limited both d/t precautions and d/t baseline limitations for LB ADLs. RLE Sensation: WNL LLE Deficits / Details: limited hip ROM for LB ADLs, functional for standing/stepping LLE Sensation: WNL       Communication Communication Communication: No difficulties   Cognition Arousal/Alertness: Awake/alert Behavior During Therapy: WFL for tasks assessed/performed Overall Cognitive Status: Within Functional Limits for tasks assessed                                       General Comments       Exercises Other Exercises Other Exercises: anterior hip precautions as it pertains to LB ADLs, use of AE PRN for LB ADLs. Pt with good reception   Shoulder Instructions      Home Living Family/patient expects to be discharged to:: Private residence Living Arrangements: Spouse/significant other;Children Available Help at Discharge: Family;Available 24 hours/day Type of Home: House Home Access: Level entry     Home Layout: One level     Bathroom Shower/Tub: Teacher, early years/pre: Standard     Home Equipment: Conservation officer, nature (2 wheels);BSC/3in1          Prior Functioning/Environment Prior Level of Function : Needs assist       Physical Assist : ADLs (physical)   ADLs (physical): Dressing Mobility Comments: Ind amb without AD in the home, [facility] PRN very limited community distances secondary to R hip pain.  Would typically use facility w/c for MD apts. No fall history. Works at [employer] on feet 50% of the time ADLs Comments: Ind with bathing and PRN assist with dressing from spouse d/t hip pain at baseline        OT Problem List: Decreased range of motion;Decreased  activity tolerance;Decreased knowledge of use of DME or AE      OT Treatment/Interventions: Self-care/ADL training;Therapeutic exercise;DME and/or AE instruction;Therapeutic activities;Patient/family education    OT Goals(Current goals can be found in the care plan section) Acute Rehab OT Goals Patient Stated Goal: to go home OT Goal Formulation: With patient Time For Goal Achievement: 08/02/21 Potential to Achieve Goals: Good ADL Goals Pt Will Perform Lower Body Dressing: with modified independence;sit to/from stand (AE PRN) Pt Will Transfer to Toilet: with modified independence;ambulating (LRAD to/from restroom PRN)  OT Frequency: Min 2X/week   Barriers to D/C:            Co-evaluation              AM-PAC OT "6 Clicks" Daily Activity     Outcome Measure Help from another person eating meals?: None Help from another person taking care of personal grooming?: None Help from  another person toileting, which includes using toliet, bedpan, or urinal?: None Help from another person bathing (including washing, rinsing, drying)?: A Little Help from another person to put on and taking off regular upper body clothing?: None Help from another person to put on and taking off regular lower body clothing?: A Little 6 Click Score: 22   End of Session Nurse Communication: Mobility status  Activity Tolerance: Patient tolerated treatment well Patient left: Other (comment) (seated EOB with PT presenting to room for tx)  OT Visit Diagnosis: Other abnormalities of gait and mobility (R26.89);Muscle weakness (generalized) (M62.81)                Time: 4997-1820 OT Time Calculation (min): 23 min Charges:  OT General Charges $OT Visit: 1 Visit OT Evaluation $OT Eval Low Complexity: 1 Low OT Treatments $Self Care/Home Management : 8-22 mins  Gerrianne Scale, MS, OTR/L ascom [phone] 07/19/21, 9:37 AM

## 2021-07-19 NOTE — Progress Notes (Signed)
Physical Therapy Treatment Patient Details Name: Jake Kirby MRN: [id number] DOB: Jan 04, 1966 Today's Date: 07/19/2021   History of Present Illness Pt is a 55 yo M diagnosed with osteoarthritis right hip and is s/p elective R THA. PMH includes HTN and CVA.    PT Comments    Excellent progress towards all therapy goals.  Easily completes full lap around nursing station (250') with RW, sup/mod indep; completes stair training (up/down 4 with bilat rails), close sup.  Endorses very minimal pain to R LE and demonstrates excellent active use/WBing with all transfers and mobility tasks. Reviewed car transfers, mobility progression and HEP, anterior THPs (and functional implications); patient voiced understanding of all information.  No additional questions/concerns at this time; eager for discharge.  Given noted progress, discharge recommendation for therapy updated from HHPT to outpatient PT.  Patient in agreement; has appt already scheduled.  Safe and appropriate for discharge home from rehab perspective when medically appropriate; care team informed/aware.    Recommendations for follow up therapy are one component of a multi-disciplinary discharge planning process, led by the attending physician.  Recommendations may be updated based on patient status, additional functional criteria and insurance authorization.  Follow Up Recommendations  Outpatient PT     Assistance Recommended at Discharge PRN  Equipment Recommendations  Rolling walker (2 wheels);BSC/3in1    Recommendations for Other Services       Precautions / Restrictions Precautions Precautions: Anterior Hip Precaution Booklet Issued: Yes (comment) Restrictions Weight Bearing Restrictions: Yes RLE Weight Bearing: Weight bearing as tolerated Other Position/Activity Restrictions: RLE WBAT per phone conversation with Dr. Harlow Mares     Mobility  Bed Mobility Overal bed mobility: Modified Independent             General bed  mobility comments: seated edge of bed beginning/end of treatment session    Transfers Overall transfer level: Modified independent (Simultaneous filing. User may not have seen previous data.) Equipment used: Rolling walker (2 wheels) (Simultaneous filing. User may not have seen previous data.) Transfers: Sit to/from Stand (Simultaneous filing. User may not have seen previous data.) Sit to Stand: Modified independent (Device/Increase time) (Simultaneous filing. User may not have seen previous data.)           General transfer comment: performed with and without RW; slightly wider BOS, but good stability and active use/WBing R LE (Simultaneous filing. User may not have seen previous data.)    Ambulation/Gait Ambulation/Gait assistance: Supervision;Modified independent (Device/Increase time) Gait Distance (Feet): 250 Feet Assistive device: Rolling walker (2 wheels)   Gait velocity: 10' walk time, 7-8 seconds     General Gait Details: reciprocal stepping pattern with good step height/length; good stance time and WBing R LE. Min cuing for R hip extension mid/terminal stance.  Minimal/no reports of pain increase with gait efforts   Stairs Stairs: Yes Stairs assistance: Supervision Stair Management: Two rails Number of Stairs: 4 General stair comments: reciprocal stepping pattern ascending; step to pattern descending.  Good stability/control R LE   Wheelchair Mobility    Modified Rankin (Stroke Patients Only)       Balance Overall balance assessment: Modified Independent Sitting-balance support: No upper extremity supported;Feet supported Sitting balance-Leahy Scale: Normal     Standing balance support: Bilateral upper extremity supported Standing balance-Leahy Scale: Good                              Cognition Arousal/Alertness: Awake/alert Behavior During Therapy: WFL for  tasks assessed/performed Overall Cognitive Status: Within Functional Limits for  tasks assessed                                          Exercises Other Exercises Other Exercises: Reviewed car transfers, mobility progression and HEP, anterior THPs (and functional implications); patient voiced understanding of all information.    General Comments        Pertinent Vitals/Pain Pain Assessment: Faces Pain Score: 6  Faces Pain Scale: Hurts a little bit Pain Location: R hip Pain Descriptors / Indicators: Sore Pain Intervention(s): Limited activity within patient's tolerance;Monitored during session;Premedicated before session;Repositioned    Home Living Family/patient expects to be discharged to:: Private residence Living Arrangements: Spouse/significant other;Children Available Help at Discharge: Family;Available 24 hours/day Type of Home: House Home Access: Level entry       Home Layout: One level Home Equipment: Conservation officer, nature (2 wheels);BSC/3in1      Prior Function            PT Goals (current goals can now be found in the care plan section) Acute Rehab PT Goals Patient Stated Goal: To get back to work PT Goal Formulation: With patient Time For Goal Achievement: 07/31/21 Potential to Achieve Goals: Good Progress towards PT goals: Progressing toward goals    Frequency    BID      PT Plan Discharge plan needs to be updated    Co-evaluation              AM-PAC PT "6 Clicks" Mobility   Outcome Measure  Help needed turning from your back to your side while in a flat bed without using bedrails?: None Help needed moving from lying on your back to sitting on the side of a flat bed without using bedrails?: None Help needed moving to and from a bed to a chair (including a wheelchair)?: None Help needed standing up from a chair using your arms (e.g., wheelchair or bedside chair)?: None Help needed to walk in hospital room?: None Help needed climbing 3-5 steps with a railing? : A Little 6 Click Score: 23    End of  Session Equipment Utilized During Treatment: Gait belt Activity Tolerance: Patient tolerated treatment well Patient left:  (seated edge of bed for breakfast; needs in reach) Nurse Communication: Mobility status;Weight bearing status PT Visit Diagnosis: Other abnormalities of gait and mobility (R26.89);Muscle weakness (generalized) (M62.81);Pain Pain - Right/Left: Right Pain - part of body: Hip     Time: 0981-1914 PT Time Calculation (min) (ACUTE ONLY): 13 min  Charges:  $Gait Training: 8-22 mins                     Corabelle Spackman H. Owens Shark, PT, DPT, NCS 07/19/21, 9:39 AM [phone]

## 2021-07-24 ENCOUNTER — Other Ambulatory Visit: Payer: Self-pay | Admitting: *Deleted

## 2021-07-24 MED ORDER — ATORVASTATIN CALCIUM 20 MG PO TABS: 20.0000 mg | ORAL_TABLET | Freq: Every day | ORAL | 0 refills | Status: DC

## 2021-07-30 ENCOUNTER — Ambulatory Visit (INDEPENDENT_AMBULATORY_CARE_PROVIDER_SITE_OTHER): Payer: Commercial Managed Care - PPO | Admitting: Family Medicine

## 2021-07-30 ENCOUNTER — Encounter: Payer: Self-pay | Admitting: Family Medicine

## 2021-07-30 VITALS — BP 189/109 | HR 89 | Ht 68.5 in | Wt 271.0 lb

## 2021-07-30 DIAGNOSIS — G40909 Epilepsy, unspecified, not intractable, without status epilepticus: Secondary | ICD-10-CM

## 2021-07-30 MED ORDER — LEVETIRACETAM 500 MG PO TABS: 500.0000 mg | ORAL_TABLET | Freq: Two times a day (BID) | ORAL | 3 refills | Status: DC

## 2021-07-30 NOTE — Patient Instructions (Signed)
Below is our plan:  We will continue levetiracetam 500mg  twice daily. Please keep a close eye on your blood pressure closely. Please take meds as soon as you get home.   Please make sure you are staying well hydrated. I recommend 50-60 ounces daily. Well balanced diet and regular exercise encouraged. Consistent sleep schedule with 6-8 hours recommended.   Please continue follow up with care team as directed.   Follow up with me in 1 year, sooner if needed  You may receive a survey regarding today's visit. I encourage you to leave honest feed back as I do use this information to improve patient care. Thank you for seeing me today!

## 2021-07-30 NOTE — Progress Notes (Signed)
Chief Complaint  Patient presents with   Follow-up    Pt alone, rm 3. Presents today for follow up. Pt states in march he attempted to try weaning off the sz meds and as he was weaning down he started to notice shakes coming back as if he was going to have a seizure. States didn't have a sz      HISTORY OF PRESENT ILLNESS:  07/30/21 ALL:  Jake Kirby is a 56 y.o. male here today for follow up for seizures. He was restarted on levetiracetam 500mg  BID at consult 07/2020. When taking consistently he feels well. He tried to wean medication in 09/2020 but felt that he started to notice more generalized shaking and more zoning out. No definite seizure activity. BP is elevated. Home readings are 130-140. He has not taken his BP meds, today. He is s/p right total hip replacement 12/28. He is recovering well but feels pain is causing his BP to be elevated, today.    HISTORY (copied from Dr Gladstone Lighter previous note)  56 year old male here for evaluation of seizures.  History of hypertension, tobacco abuse, obstructive sleep apnea.  On 10/7/1 patient was at home and not feeling well, had multiple generalized seizures witnessed by family.  Patient brought to the hospital by ambulance.  He was transferred from [facility] for long-term EEG monitoring.  Multiple EEGs were performed which showed no epileptiform discharges.  Patient was felt to have combination of epileptic and nonepileptic spells.  He was discharged on levetiracetam 5 mg twice a day.  Patient completed this dosing through the end of November and then ran out of medication.  Since that time has had intermittent episodes of staring spells, jittery shaking sensation, amnesia.  Episodes can last 1 or 2 minutes at a time.  Afterwards he feels tired.   REVIEW OF SYSTEMS: Out of a complete 14 system review of symptoms, the patient complains only of the following symptoms, right hip pain, and all other reviewed systems  are negative.   ALLERGIES: No Known Allergies   HOME MEDICATIONS: Outpatient Medications Prior to Visit  Medication Sig Dispense Refill   amLODipine (NORVASC) 5 MG tablet Take 2 tablets (10 mg total) by mouth daily. (Patient taking differently: Take 10 mg by mouth every morning.) 90 tablet 2   aspirin 81 MG chewable tablet Chew 1 tablet (81 mg total) by mouth 2 (two) times daily. 60 tablet 0   atorvastatin (LIPITOR) 20 MG tablet Take 1 tablet (20 mg total) by mouth daily. 90 tablet 0   docusate sodium (COLACE) 100 MG capsule Take 1 capsule (100 mg total) by mouth 2 (two) times daily. 30 capsule 0   hydrALAZINE (APRESOLINE) 25 MG tablet Take 1 tablet (25 mg total) by mouth in the morning and at bedtime. (Patient taking differently: Take 25 mg by mouth every morning.) 180 tablet 1   ibuprofen (ADVIL) 800 MG tablet Take 800 mg by mouth every 8 (eight) hours as needed for moderate pain.     isosorbide mononitrate (IMDUR) 60 MG 24 hr tablet Take 1.5 tablets (90 mg total) by mouth daily. (Patient taking differently: Take 90 mg by mouth every morning.) 45 tablet 6   lisinopril (ZESTRIL) 40 MG tablet Take 1 tablet (40 mg total) by mouth daily. (Patient taking differently: Take 40 mg by mouth every morning.) 90 tablet 2   methocarbamol (ROBAXIN) 500 MG tablet Take 1 tablet (500 mg total) by mouth 3 (three) times  daily. 60 tablet 2   oxyCODONE (OXY IR/ROXICODONE) 5 MG immediate release tablet Take 1 tablet (5 mg total) by mouth every 4 (four) hours as needed for moderate pain (pain). 30 tablet 0   levETIRAcetam (KEPPRA) 500 MG tablet Take 1 tablet (500 mg total) by mouth 2 (two) times daily. 180 tablet 4   No facility-administered medications prior to visit.     PAST MEDICAL HISTORY: Past Medical History:  Diagnosis Date   Arthritis    Depression    Headache    Hypertension    uncontrolled   Seizure ([place])    last seizure was beginning of 2022   Sleep apnea    has not f/u about getting cpap    Stroke ([place]) 04/2020   has affected memory and left sided weakness/will occ drag left foot     PAST SURGICAL HISTORY: Past Surgical History:  Procedure Laterality Date   BACK SURGERY     lumbar fusion   BREAST SURGERY     COLONOSCOPY WITH PROPOFOL N/A 02/12/2019   Procedure: COLONOSCOPY WITH PROPOFOL;  Surgeon: Virgel Manifold, MD;  Location: [facility] ENDOSCOPY;  Service: Endoscopy;  Laterality: N/A;   TOTAL HIP ARTHROPLASTY Right 07/18/2021   Procedure: TOTAL HIP ARTHROPLASTY ANTERIOR APPROACH;  Surgeon: Lovell Sheehan, MD;  Location: [facility] ORS;  Service: Orthopedics;  Laterality: Right;     FAMILY HISTORY: Family History  Problem Relation Age of Onset   Hypertension Mother    Diabetes Mother    Heart attack Father 60     SOCIAL HISTORY: Social History   Socioeconomic History   Marital status: Married    Spouse name: Not on file   Number of children: 3   Years of education: some college   Highest education level: Not on file  Occupational History   Occupation: Firefighter  Tobacco Use   Smoking status: Light Smoker    Types: Cigarettes   Smokeless tobacco: Never   Tobacco comments:    social smoker when having a beer.   Vaping Use   Vaping Use: Never used  Substance and Sexual Activity   Alcohol use: Yes    Comment: social - at most 5 beers per month   Drug use: No   Sexual activity: Not on file  Other Topics Concern   Not on file  Social History Narrative   Lives with family.   Right-handed.   3 biological children, 3 stepchildren   Caffeine use: 2 cups per day.   Social Determinants of Health   Financial Resource Strain: Not on file  Food Insecurity: Not on file  Transportation Needs: Not on file  Physical Activity: Not on file  Stress: Not on file  Social Connections: Not on file  Intimate Partner Violence: Not on file     PHYSICAL EXAM  Vitals:   07/30/21 0847  BP: (!) 189/109  Pulse: 89  Weight: 271 lb (122.9 kg)  Height: 5'  8.5" (1.74 m)   Body mass index is 40.61 kg/m.  Generalized: Well developed, in no acute distress  Cardiology: normal rate and rhythm, no murmur auscultated  Respiratory: clear to auscultation bilaterally    Neurological examination  Mentation: Alert oriented to time, place, history taking. Follows all commands speech and language fluent Cranial nerve II-XII: Pupils were equal round reactive to light. Extraocular movements were full, visual field were full on confrontational test. Facial sensation and strength were normal. Uvula tongue midline. Head turning and shoulder shrug  were normal  and symmetric. Motor: The motor testing reveals 5 over 5 strength of all 4 extremities. Good symmetric motor tone is noted throughout.  Gait and station: right limp, s/p right hip replacement   DIAGNOSTIC DATA (LABS, IMAGING, TESTING) - I reviewed patient records, labs, notes, testing and imaging myself where available.  Lab Results  Component Value Date   WBC 10.9 (H) 07/19/2021   HGB 11.5 (L) 07/19/2021   HCT 37.1 (L) 07/19/2021   MCV 84.9 07/19/2021   PLT 224 07/19/2021      Component Value Date/Time   NA 138 07/19/2021 0531   NA 143 05/29/2021 0853   K 3.5 07/19/2021 0531   CL 103 07/19/2021 0531   CO2 27 07/19/2021 0531   GLUCOSE 151 (H) 07/19/2021 0531   BUN 20 07/19/2021 0531   BUN 13 05/29/2021 0853   CREATININE 1.39 (H) 07/19/2021 0531   CALCIUM 8.2 (L) 07/19/2021 0531   PROT 7.2 05/29/2021 0853   ALBUMIN 4.1 05/29/2021 0853   AST 13 05/29/2021 0853   ALT 15 05/29/2021 0853   ALKPHOS 83 05/29/2021 0853   BILITOT 0.3 05/29/2021 0853   GFRNONAA >60 07/19/2021 0531   GFRAA 82 06/20/2020 1030   Lab Results  Component Value Date   CHOL 235 (H) 05/31/2020   HDL 39 (L) 05/31/2020   LDLCALC 178 (H) 05/31/2020   TRIG 79 05/31/2020   CHOLHDL 6.0 (H) 05/31/2020   Lab Results  Component Value Date   HGBA1C 5.8 (H) 04/29/2020   No results found for: VITAMINB12 No results  found for: TSH  No flowsheet data found.   No flowsheet data found.   ASSESSMENT AND PLAN  56 y.o. year old male  has a past medical history of Arthritis, Depression, Headache, Hypertension, Seizure ([place]), Sleep apnea, and Stroke ([place]) (04/2020). here with    Seizure disorder ([place])  Katie is tolerating levetiracetam well. No seizure activity. We will continue levetiracetam 500mg  BID. He was advised to take medication consistently. BP is elevated. He is asymptomatic. He has not taken BP meds. He was advised to take as soon as he gets home. Home readings usually 361-443 systolic. He will call PCP for continued concerns of elevation. He has follow up with orthopedics, tomorrow. Healthy lifestyle habits encouraged. He will follow up with me in 1 year, sooner if needed.    No orders of the defined types were placed in this encounter.    Meds ordered this encounter  Medications   levETIRAcetam (KEPPRA) 500 MG tablet    Sig: Take 1 tablet (500 mg total) by mouth 2 (two) times daily.    Dispense:  180 tablet    Refill:  3    Patient will call when refill needed    Order Specific Question:   Supervising Provider    Answer:   Melvenia Beam [[id number]]      Debbora Presto, MSN, FNP-C 07/30/2021, 9:09 AM  [facility] [address], [place], [place] [address] [phone]

## 2021-07-31 ENCOUNTER — Encounter: Payer: Self-pay | Admitting: Physician Assistant

## 2021-07-31 ENCOUNTER — Ambulatory Visit: Payer: Commercial Managed Care - PPO | Admitting: Physician Assistant

## 2021-07-31 ENCOUNTER — Other Ambulatory Visit: Payer: Self-pay

## 2021-07-31 DIAGNOSIS — I10 Essential (primary) hypertension: Secondary | ICD-10-CM

## 2021-07-31 DIAGNOSIS — I69398 Other sequelae of cerebral infarction: Secondary | ICD-10-CM

## 2021-07-31 DIAGNOSIS — R079 Chest pain, unspecified: Secondary | ICD-10-CM

## 2021-07-31 DIAGNOSIS — R011 Cardiac murmur, unspecified: Secondary | ICD-10-CM

## 2021-07-31 DIAGNOSIS — G4733 Obstructive sleep apnea (adult) (pediatric): Secondary | ICD-10-CM

## 2021-07-31 DIAGNOSIS — I77810 Thoracic aortic ectasia: Secondary | ICD-10-CM

## 2021-07-31 DIAGNOSIS — G40909 Epilepsy, unspecified, not intractable, without status epilepticus: Secondary | ICD-10-CM

## 2021-07-31 DIAGNOSIS — R0602 Shortness of breath: Secondary | ICD-10-CM

## 2021-07-31 MED ORDER — ISOSORBIDE MONONITRATE ER 120 MG PO TB24: 120.0000 mg | ORAL_TABLET | Freq: Every day | ORAL | 3 refills | Status: DC

## 2021-07-31 NOTE — Progress Notes (Signed)
Office Visit    Patient Name: Jake Kirby Date of Encounter: 07/31/2021  PCP:  Cletis [place], [place]  Cardiologist:  Nelva Bush, MD  Advanced Practice Provider:  No care team member to display Electrophysiologist:  None   Chief Complaint    AQIB LOUGH is a 56 y.o. male with a hx of HTN, seizure, mild dilation of the ascending aorta, chest pain, and dyspnea who presents today for follow-up.   Past Medical History    Past Medical History:  Diagnosis Date   Arthritis    Depression    Headache    Hypertension    uncontrolled   Seizure ([place])    last seizure was beginning of 2022   Sleep apnea    has not f/u about getting cpap   Stroke [facility]) 04/2020   has affected memory and left sided weakness/will occ drag left foot   Past Surgical History:  Procedure Laterality Date   BACK SURGERY     lumbar fusion   BREAST SURGERY     COLONOSCOPY WITH PROPOFOL N/A 02/12/2019   Procedure: COLONOSCOPY WITH PROPOFOL;  Surgeon: Virgel Manifold, MD;  Location: [facility] ENDOSCOPY;  Service: Endoscopy;  Laterality: N/A;   TOTAL HIP ARTHROPLASTY Right 07/18/2021   Procedure: TOTAL HIP ARTHROPLASTY ANTERIOR APPROACH;  Surgeon: Lovell Sheehan, MD;  Location: [facility] ORS;  Service: Orthopedics;  Laterality: Right;    Allergies  No Known Allergies  History of Present Illness    Jake Kirby is a 56 y.o. male with a hx of HTN, seizure, mild dilation of the ascending aorta, chest pain, and dyspnea who presents today for follow-up last seen 06/20/2021 by Laurann Montana, NP.  He was seen in consult by Dr. Saunders Revel for evaluation of hypertension. He was hospitalized 04/28/20 after witnessed seizure, head CT notable for intracranial lipoma and remote left parietal stroke versus focal cortical dysplasia.     When seen by Dr. Saunders Revel he noted fatigue, orthopnea without functional CPAP, nonexertional chest pain, and dyspnea on exertion. Murmur was noted on exam. He  was recommended for echo and carotid duplex with consideration of cardiac CTA at follow up. He was started on Chlorthalidone for improved BP control.    Seen in the ED 06/10/20 due to left sided chest pain worse with movement. He chest wall was tender on palpation and he was given lidocaine patch. Troponin negative x2 and CXR without concerning findings.   Carotid duplex 06/13/20 with no evidence of plaque nor stenosis bilaterally. Echo 06/13/20 with LVEF 55-60%, unable to evaluate wall motion, moderate LVH, grade 1 diastolic dysfunction, RV mildly enlarged, no significant valvular abnormalities, mild dilation of aortic root (25mm), mildly dilated pulmonary artery.    Since last seen his Bystolic dose has been increased by his primary care provider and he has also been started on Amlodipine as well as Atorvastatin.   When he was seen last he was still having some elevated Bps at home. He reported no lightheadedness, dizziness, near syncope nor syncope. He had a very short episode of seizure like activity but he was told by his neurologist not to [facility] unless they last more than 5 minutes. He has scheduled f/u with neurology in Feb.  Today, he feels pretty good from a cardiac standpoint.  He just had a total hip replacement on the right and is undergoing physical therapy.  This is his second week.  He states he does take his blood  pressure at home occasionally and it varies from 130Q to 657Q systolic.  He does occasionally have some lower extremity edema which usually occurs when he is up on his feet most of the day.  We discussed maintaining a heart healthy, low-sodium diet.  He admits to eating a lot of salty fast food.  We discussed staying away from fast food and eating fresh fruits and vegetables and lean meats.  Also not buying any processed food that has added sodium.  I asked him to maintain a sodium level of less than 2 g a day.  He also plans to increase his activity level now that his hip is  replaced and is on his second week of physical therapy.  He looks forward to walking pain-free.  He is still working as a Probation officer.  He recently had follow-up with neurology for his seizures and is currently taking Keppra 500 mg twice a day.  He is also on some pain medicine for his hip.  For his blood pressure he takes 5 mg of Norvasc, lisinopril 40 mg, and Imdur 90 mg which was recently increased by month ago.  He has not had any chest pain, shortness of breath, dizziness, lightheadedness, or palpitations. We discussed compression socks for the days that he is on his feet most of the day.   Reports no shortness of breath nor dyspnea on exertion. Reports no chest pain, pressure, or tightness. No significant edema, orthopnea, PND. Reports no palpitations.     EKGs/Labs/Other Studies Reviewed:   The following studies were reviewed today:  Cardiac CT 12/21  IMPRESSION: 1. Ascending thoracic aortic aneurysm measuring at least 4.5 cm diameter. Please see the separate concurrent chest CT angiogram report today for further details and follow-up recommendations. 2. Bilateral solid pulmonary nodules, largest 7 mm in the right lower lobe. Non-contrast chest CT at 3-6 months is recommended. If the nodules are stable at time of repeat CT, then future CT at 18-24 months (from today's scan) is considered optional for low-risk patients, but is recommended for high-risk patients. This recommendation follows the consensus statement: Guidelines for Management of Incidental Pulmonary Nodules Detected on CT Images: From the Fleischner Society 2017; Radiology 2017; 284:228-243. 3. Mild cardiomegaly. 4. Aortic Atherosclerosis (ICD10-I70.0).  Carotid duplex 06/13/20 Summary:  Right Carotid: There was no evidence of thrombus, dissection, atherosclerotic plaque or stenosis in the cervical carotid system.   Left Carotid: There was no evidence of thrombus, dissection,  atherosclerotic plaque or  stenosis in the cervical carotid system.   Vertebrals:  Bilateral vertebral arteries demonstrate antegrade flow.  Subclavians: Normal flow hemodynamics were seen in bilateral subclavian arteries.   CLINICAL DATA:  Dilated aortic root by echocardiography.   EXAM: CT ANGIOGRAPHY CHEST WITH CONTRAST   TECHNIQUE: Multidetector CT imaging of the chest was performed using the standard protocol during bolus administration of intravenous contrast. Multiplanar CT image reconstructions and MIPs were obtained to evaluate the vascular anatomy.   Imaging for this study was performed on 07/20/2020 at the same time as a coronary CTA. Is being reported on 07/24/2020 due to delay in separate order for the CTA of the chest to be submitted.   CONTRAST:  125mL OMNIPAQUE IOHEXOL 350 MG/ML SOLN   COMPARISON:  Coronary CTA performed also on 07/20/2020.   FINDINGS: Cardiovascular: The aortic root measures approximately 4.0 cm at the level of the sinuses of Valsalva. The ascending thoracic aorta demonstrates mild aneurysmal dilatation and measures 4.2 cm and greatest estimated diameter. The  proximal arch measures 3.7 cm and the distal arch 2.8 cm. The descending thoracic aorta measures 2.7 cm. There is no evidence of aortic dissection. Proximal great vessels demonstrate bovine branching anatomy. Visualized proximal great vessels demonstrate normal patency.   Mildly prominent caliber of central pulmonary arteries. The heart size is mildly enlarged and left ventricular myocardium demonstrates suggestion of left ventricular hypertrophy by CT. No pericardial fluid is identified.   Mediastinum/Nodes: No enlarged mediastinal, hilar, or axillary lymph nodes. Thyroid gland, trachea, and esophagus demonstrate no significant findings.   Lungs/Pleura: As indicated in the over read for the coronary CTA study, there is a triangular shaped nodular region at the right lung base measuring roughly 5 x 8 mm.    Upper Abdomen: Visualized upper abdomen shows a small nonobstructing calculus in the right mid kidney. The liver demonstrates evidence of steatosis without overt cirrhosis.   Musculoskeletal: No chest wall abnormality. No acute or significant osseous findings.   Review of the MIP images confirms the above findings.   IMPRESSION: 1. Mild aneurysmal dilatation of the ascending thoracic aorta measuring 4.2 cm in greatest estimated diameter. Recommend annual imaging followup by CTA or MRA. This recommendation follows 2010 ACCF/AHA/AATS/ACR/ASA/SCA/SCAI/SIR/STS/SVM Guidelines for the Diagnosis and Management of Patients with Thoracic Aortic Disease. Circulation. 2010; 121: Y174-B449. Aortic aneurysm NOS (ICD10-I71.9) 2. Mild cardiomegaly with suggestion of left ventricular hypertrophy by CT. 3. Hepatic steatosis without overt cirrhosis. 4. Small nonobstructing calculus in the right mid kidney. 5. As indicated in the over read for the coronary CTA study, there is a triangular shaped nodular region at the right lung base measuring roughly 5 x 8 mm. Non-contrast chest CT at 6-12 months is recommended. If the nodule is stable at time of repeat CT, then future CT at 18-24 months (from today's scan) is considered optional for low-risk patients, but is recommended for high-risk patients. This recommendation follows the consensus statement: Guidelines for Management of Incidental Pulmonary Nodules Detected on CT Images: From the Fleischner Society 2017; Radiology 2017; 284:228-243.     Electronically Signed   By: Aletta Edouard M.D.   On: 07/24/2020 11:48    EKG:  EKG is not ordered today.  Recent Labs: 05/29/2021: ALT 15 07/19/2021: BUN 20; Creatinine, Ser 1.39; Hemoglobin 11.5; Platelets 224; Potassium 3.5; Sodium 138  Recent Lipid Panel    Component Value Date/Time   CHOL 235 (H) 05/31/2020 1113   TRIG 79 05/31/2020 1113   HDL 39 (L) 05/31/2020 1113   CHOLHDL 6.0 (H) 05/31/2020  1113   LDLCALC 178 (H) 05/31/2020 1113     Home Medications   Current Meds  Medication Sig   amLODipine (NORVASC) 5 MG tablet Take 2 tablets (10 mg total) by mouth daily.   aspirin 81 MG chewable tablet Chew 1 tablet (81 mg total) by mouth 2 (two) times daily.   atorvastatin (LIPITOR) 20 MG tablet Take 1 tablet (20 mg total) by mouth daily.   docusate sodium (COLACE) 100 MG capsule Take 1 capsule (100 mg total) by mouth 2 (two) times daily.   hydrALAZINE (APRESOLINE) 25 MG tablet Take 1 tablet (25 mg total) by mouth in the morning and at bedtime.   ibuprofen (ADVIL) 800 MG tablet Take 800 mg by mouth every 8 (eight) hours as needed for moderate pain.   levETIRAcetam (KEPPRA) 500 MG tablet Take 1 tablet (500 mg total) by mouth 2 (two) times daily.   lisinopril (ZESTRIL) 40 MG tablet Take 1 tablet (40 mg total) by mouth daily.  methocarbamol (ROBAXIN) 500 MG tablet Take 1 tablet (500 mg total) by mouth 3 (three) times daily.   oxyCODONE (OXY IR/ROXICODONE) 5 MG immediate release tablet Take 1 tablet (5 mg total) by mouth every 4 (four) hours as needed for moderate pain (pain).   [DISCONTINUED] isosorbide mononitrate (IMDUR) 60 MG 24 hr tablet Take 1.5 tablets (90 mg total) by mouth daily.     Review of Systems      All other systems reviewed and are otherwise negative except as noted above.  Physical Exam    VS:  BP (!) 150/100 (BP Location: Left Arm, Patient Position: Sitting, Cuff Size: Large)    Pulse (!) 103    Ht 5' 8.5" (1.74 m)    Wt 273 lb (123.8 kg)    SpO2 97%    BMI 40.91 kg/m  , BMI Body mass index is 40.91 kg/m.  Wt Readings from Last 3 Encounters:  07/31/21 273 lb (123.8 kg)  07/30/21 271 lb (122.9 kg)  07/18/21 293 lb 10.4 oz (133.2 kg)     GEN: Well nourished, well developed, in no acute distress. HEENT: normal. Neck: Supple, no JVD, carotid bruits, or masses. Cardiac: RRR, no murmurs, rubs, or gallops. No clubbing, cyanosis, edema.  Radials/PT 2+ and equal  bilaterally.  Respiratory:  Respirations regular and unlabored, clear to auscultation bilaterally. GI: Soft, nontender, nondistended. MS: No deformity or atrophy. Skin: Warm and dry, no rash. Neuro:  Strength and sensation are intact. Psych: Normal affect.  Assessment & Plan    Hx of Shortness of breath, chest pain -Echocardiogram 11/21 LVEF 55 to 60%, grade 1 DD, moderate LVH, no significant valvular abnormalities, mild dilated aortic root 44 mm. -Cardiac CT for ischemic evaluation showed: coronary calcium score of 0 -No further chest pain or significant SOB  Mild dilation of aortic root (55mm) by Echo 11/21 -Likely cause uncontrolled HTN -Continue to monitor HTN closely -CT scan 07/24/2020 ascending thoracic aorta measuring 4.2 cm -Continue annual surveillance  HTN -Bp today is 150/100 -At home he has been 381W-299B systolic.  -Encouraged a low-sodium diet, he does admit to eating a lot of salty fast foods -Encouraged increase in activity level -Increase Imdur to 120mg  daily -Continue Lisinopril and Norvasc at current dose  OSA -Continue CPAP in the context of HTN  Seizure disorder -Continue f/u with neurology -On Keppra and tolerating  Morbid obesity -weight loss discussed with diet and exercise modifications. -Discussed implications of weight in the setting of HTN - He plans to increase his activity level with his new hip replacement  Hyperlipidemia- -Last lipid panel showed triglycerides 79, LDL 178, HDL 39, total cholesterol 235 -He is currently on Lipitor 20 mg daily -He will need a repeat lipid panel the next time he is in the office and medications titrated if appropriate  Disposition: Follow up in 3 months with Nelva Bush, MD or APP.  Signed, Elgie Collard, PA-C 07/31/2021, 3:58 PM [facility]

## 2021-07-31 NOTE — Patient Instructions (Addendum)
Medication Instructions:   Your physician has recommended you make the following change in your medication:   INCREASE isosorbide mononitrate (Imdur) to 120 mg daily   *If you need a refill on your cardiac medications before your next appointment, please call your pharmacy*   Lab Work: None ordered  If you have labs (blood work) drawn today and your tests are completely normal, you will receive your results only by: [place] (if you have MyChart) OR A paper copy in the mail If you have any lab test that is abnormal or we need to change your treatment, we will call you to review the results.   Testing/Procedures: None ordered   Follow-Up: At [facility], you and your health needs are our priority.  As part of our continuing mission to provide you with exceptional heart care, we have created designated Provider Care Teams.  These Care Teams include your primary Cardiologist (physician) and Advanced Practice Providers (APPs -  Physician Assistants and Nurse Practitioners) who all work together to provide you with the care you need, when you need it.  We recommend signing up for the patient portal called "MyChart".  Sign up information is provided on this After Visit Summary.  MyChart is used to connect with patients for Virtual Visits (Telemedicine).  Patients are able to view lab/test results, encounter notes, upcoming appointments, etc.  Non-urgent messages can be sent to your provider as well.   To learn more about what you can do with MyChart, go to [email or web address].    Your next appointment:   3 month(s)  The format for your next appointment:   In Person  Provider:   You may see Nelva Bush, MD or one of the following Advanced Practice Providers on your designated Care Team:   Murray Hodgkins, NP Christell Faith, PA-C Cadence Kathlen Mody, PA-C :1}    Other Instructions  [facility] Eating Plan  Elsevier Patient Education  2022 [place].. Last revised: June 27, 2021. DASH stands for Dietary Approaches to Stop Hypertension. The DASH eating plan is a healthy eating plan that has been shown to: Reduce high blood pressure (hypertension). Reduce your risk for type 2 diabetes, heart disease, and stroke. Help with weight loss.  What are tips for following this plan? Reading food labels Check food labels for the amount of salt (sodium) per serving. Choose foods with less than 5 percent of the Daily Value of sodium. Generally, foods with less than 300 milligrams (mg) of sodium per serving fit into this eating plan. To find whole grains, look for the word "whole" as the first word in the ingredient list.  Shopping Buy products labeled as "low-sodium" or "no salt added." Buy fresh foods. Avoid canned foods and pre-made or frozen meals.  Cooking Avoid adding salt when cooking. Use salt-free seasonings or herbs instead of table salt or sea salt. Check with your health care provider or pharmacist before using salt substitutes. Do not fry foods. Cook foods using healthy methods such as baking, boiling, grilling, roasting, and broiling instead. Cook with heart-healthy oils, such as olive, canola, avocado, soybean, or sunflower oil.  Meal planning Eat a balanced diet that includes: 4 or more servings of fruits and 4 or more servings of vegetables each day. Try to fill one-half of your plate with fruits and vegetables. 6-8 servings of whole grains each day. Less than 6 oz (170 g) of lean meat, poultry, or fish each day. A 3-oz (85-g) serving of meat is about the same  size as a deck of cards. One egg equals 1 oz (28 g). 2-3 servings of low-fat dairy each day. One serving is 1 cup (237 mL). 1 serving of nuts, seeds, or beans 5 times each week. 2-3 servings of heart-healthy fats. Healthy fats called omega-3 fatty acids are found in foods such as walnuts, flaxseeds, fortified milks, and eggs. These fats are also found in cold-water fish, such as sardines, salmon,  and mackerel.  Limit how much you eat of: Canned or prepackaged foods. Food that is high in trans fat, such as some fried foods. Food that is high in saturated fat, such as fatty meat. Desserts and other sweets, sugary drinks, and other foods with added sugar. Full-fat dairy products.  Do not salt foods before eating. Do not eat more than 4 egg yolks a week. Try to eat at least 2 vegetarian meals a week. Eat more home-cooked food and less restaurant, buffet, and fast food. Lifestyle When eating at a restaurant, ask that your food be prepared with less salt or no salt, if possible. If you drink alcohol: Limit how much you use to: 0-1 drink a day for women who are not pregnant. 0-2 drinks a day for men.  Be aware of how much alcohol is in your drink. In the [place] one drink equals one 12 oz bottle of beer (355 mL), one 5 oz glass of wine (148 mL), or one 1 oz glass of hard liquor (44 mL).   General information Avoid eating more than 2,300 mg of salt a day. If you have hypertension, you may need to reduce your sodium intake to 1,500 mg a day. Work with your health care provider to maintain a healthy body weight or to lose weight. Ask what an ideal weight is for you. Get at least 30 minutes of exercise that causes your heart to beat faster (aerobic exercise) most days of the week. Activities may include walking, swimming, or biking. Work with your health care provider or dietitian to adjust your eating plan to your individual calorie needs.  What foods should I eat? Fruits All fresh, dried, or frozen fruit. Canned fruit in natural juice (without added sugar). Vegetables Fresh or frozen vegetables (raw, steamed, roasted, or grilled). Low-sodium or reduced-sodium tomato and vegetable juice. Low-sodium or reduced-sodium tomato sauce and tomato paste. Low-sodium or reduced-sodium canned vegetables. Grains Whole-grain or whole-wheat bread. Whole-grain or whole-wheat pasta. Brown rice.  [place]. Bulgur. Whole-grain and low-sodium cereals. Pita bread. Low-fat, low-sodium crackers. Whole-wheat flour tortillas. Meats and other proteins Skinless chicken or [place]. Ground chicken or [place]. Pork with fat trimmed off. Fish and seafood. Egg whites. Dried beans, peas, or lentils. Unsalted nuts, nut butters, and seeds. Unsalted canned beans. Lean cuts of beef with fat trimmed off. Low-sodium, lean precooked or cured meat, such as sausages or meat loaves. Dairy Low-fat (1%) or fat-free (skim) milk. Reduced-fat, low-fat, or fat-free cheeses. Nonfat, low-sodium ricotta or cottage cheese. Low-fat or nonfat yogurt. Low-fat, low-sodium cheese. Fats and oils Soft margarine without trans fats. Vegetable oil. Reduced-fat, low-fat, or light mayonnaise and salad dressings (reduced-sodium). Canola, safflower, olive, avocado, soybean, and sunflower oils. Avocado. Seasonings and condiments Herbs. Spices. Seasoning mixes without salt. Other foods Unsalted popcorn and pretzels. Fat-free sweets. The items listed above may not be a complete list of foods and beverages you can eat. Contact a dietitian for more information. What foods should I avoid? Fruits Canned fruit in a light or heavy syrup. Fried fruit. Fruit in cream or  butter sauce. Vegetables Creamed or fried vegetables. Vegetables in a cheese sauce. Regular canned vegetables (not low-sodium or reduced-sodium). Regular canned tomato sauce and paste (not low-sodium or reduced-sodium). Regular tomato and vegetable juice (not low-sodium or reduced-sodium). Angie Fava. Olives. Grains Baked goods made with fat, such as croissants, muffins, or some breads. Dry pasta or rice meal packs. Meats and other proteins Fatty cuts of meat. Ribs. Fried meat. Berniece Salines. Bologna, salami, and other precooked or cured meats, such as sausages or meat loaves. Fat from the back of a pig (fatback). Bratwurst. Salted nuts and seeds. Canned beans with added salt. Canned or  smoked fish. Whole eggs or egg yolks. Chicken or [place] with skin. Dairy Whole or 2% milk, cream, and half-and-half. Whole or full-fat cream cheese. Whole-fat or sweetened yogurt. Full-fat cheese. Nondairy creamers. Whipped toppings. Processed cheese and cheese spreads. Fats and oils Butter. Stick margarine. Lard. Shortening. Ghee. Bacon fat. Tropical oils, such as coconut, palm kernel, or palm oil. Seasonings and condiments Onion salt, garlic salt, seasoned salt, table salt, and sea salt. Worcestershire sauce. Tartar sauce. Barbecue sauce. Teriyaki sauce. Soy sauce, including reduced-sodium. Steak sauce. Canned and packaged gravies. Fish sauce. Oyster sauce. Cocktail sauce. Store-bought horseradish. Ketchup. Mustard. Meat flavorings and tenderizers. Bouillon cubes. Hot sauces. Pre-made or packaged marinades. Pre-made or packaged taco seasonings. Relishes. Regular salad dressings. Other foods Salted popcorn and pretzels. The items listed above may not be a complete list of foods and beverages you should avoid. Contact a dietitian for more information. Where to find more information National Heart, Lung, and Blood Institute: https://wilson-eaton.com/   American Heart Association: www.heart.org   Academy of Nutrition and Dietetics: www.eatright.[place]: www.kidney.org

## 2021-08-17 ENCOUNTER — Other Ambulatory Visit: Payer: Self-pay | Admitting: *Deleted

## 2021-08-17 DIAGNOSIS — Z1211 Encounter for screening for malignant neoplasm of colon: Secondary | ICD-10-CM

## 2021-08-21 ENCOUNTER — Other Ambulatory Visit: Payer: Self-pay

## 2021-08-21 DIAGNOSIS — Z1211 Encounter for screening for malignant neoplasm of colon: Secondary | ICD-10-CM

## 2021-08-21 MED ORDER — NA SULFATE-K SULFATE-MG SULF 17.5-3.13-1.6 GM/177ML PO SOLN: 1.0000 | Freq: Once | ORAL | 0 refills | Status: AC

## 2021-08-21 NOTE — Progress Notes (Signed)
Gastroenterology Pre-Procedure Review  Request Date: 09/03/2021 Requesting Physician: Dr. Vicente Males   PATIENT REVIEW QUESTIONS: The patient responded to the following health history questions as indicated:    1. Are you having any GI issues? no 2. Do you have a personal history of Polyps? no 3. Do you have a family history of Colon Cancer or Polyps? no 4. Diabetes Mellitus? no 5. Joint replacements in the past 12 months?yes (hip in July 18 2022) 6. Major health problems in the past 3 months?no 7. Any artificial heart valves, MVP, or defibrillator?no    MEDICATIONS & ALLERGIES:    Patient reports the following regarding taking any anticoagulation/antiplatelet therapy:   Plavix, Coumadin, Eliquis, Xarelto, Lovenox, Pradaxa, Brilinta, or Effient? no Aspirin? yes (asprin 81 mg)  Patient confirms/reports the following medications:  Current Outpatient Medications  Medication Sig Dispense Refill   amLODipine (NORVASC) 5 MG tablet Take 2 tablets (10 mg total) by mouth daily. 90 tablet 2   aspirin 81 MG chewable tablet Chew 1 tablet (81 mg total) by mouth 2 (two) times daily. 60 tablet 0   atorvastatin (LIPITOR) 20 MG tablet Take 1 tablet (20 mg total) by mouth daily. 90 tablet 0   docusate sodium (COLACE) 100 MG capsule Take 1 capsule (100 mg total) by mouth 2 (two) times daily. 30 capsule 0   hydrALAZINE (APRESOLINE) 25 MG tablet Take 1 tablet (25 mg total) by mouth in the morning and at bedtime. 180 tablet 1   ibuprofen (ADVIL) 800 MG tablet Take 800 mg by mouth every 8 (eight) hours as needed for moderate pain.     isosorbide mononitrate (IMDUR) 120 MG 24 hr tablet Take 1 tablet (120 mg total) by mouth daily. 90 tablet 3   levETIRAcetam (KEPPRA) 500 MG tablet Take 1 tablet (500 mg total) by mouth 2 (two) times daily. 180 tablet 3   lisinopril (ZESTRIL) 40 MG tablet Take 1 tablet (40 mg total) by mouth daily. 90 tablet 2   methocarbamol (ROBAXIN) 500 MG tablet Take 1 tablet (500 mg total)  by mouth 3 (three) times daily. 60 tablet 2   oxyCODONE (OXY IR/ROXICODONE) 5 MG immediate release tablet Take 1 tablet (5 mg total) by mouth every 4 (four) hours as needed for moderate pain (pain). 30 tablet 0   No current facility-administered medications for this visit.    Patient confirms/reports the following allergies:  No Known Allergies  No orders of the defined types were placed in this encounter.   AUTHORIZATION INFORMATION Primary Insurance: 1D#: Group #:  Secondary Insurance: 1D#: Group #:  SCHEDULE INFORMATION: Date: 09/03/2021 Time: Location:[facility]

## 2021-08-23 ENCOUNTER — Other Ambulatory Visit: Payer: Self-pay

## 2021-08-23 MED ORDER — NA SULFATE-K SULFATE-MG SULF 17.5-3.13-1.6 GM/177ML PO SOLN: 354.0000 mL | Freq: Once | ORAL | 0 refills | Status: AC

## 2021-08-28 ENCOUNTER — Telehealth: Payer: Self-pay

## 2021-08-28 MED ORDER — PEG 3350-KCL-NA BICARB-NACL 420 G PO SOLR: 4000.0000 mL | Freq: Once | ORAL | 0 refills | Status: AC

## 2021-08-28 NOTE — Telephone Encounter (Signed)
Patient left a voicemail stating that the prep that we sent the pharmacy does not have in stock and will need a alternative sent in. Patient would like a call back

## 2021-08-28 NOTE — Telephone Encounter (Signed)
SENT IN A DIFFERENT PREP OTHER PREP OUT OF STOCK FOR PATIENT

## 2021-08-30 ENCOUNTER — Other Ambulatory Visit: Payer: Self-pay

## 2021-08-30 MED ORDER — CLENPIQ 10-3.5-12 MG-GM -GM/160ML PO SOLN: ORAL | 0 refills | Status: DC

## 2021-09-03 ENCOUNTER — Ambulatory Visit
Admission: RE | Admit: 2021-09-03 | Discharge: 2021-09-03 | Disposition: A | Discharge status: Home / Self Care | Payer: Commercial Managed Care - PPO | Source: Ambulatory Visit | Attending: Gastroenterology | Admitting: Gastroenterology

## 2021-09-03 ENCOUNTER — Ambulatory Visit: Payer: Commercial Managed Care - PPO | Admitting: Certified Registered"

## 2021-09-03 ENCOUNTER — Encounter
Admission: RE | Disposition: A | Discharge status: Home / Self Care | Payer: Self-pay | Source: Ambulatory Visit | Attending: Gastroenterology

## 2021-09-03 ENCOUNTER — Encounter: Payer: Self-pay | Admitting: Gastroenterology

## 2021-09-03 DIAGNOSIS — K635 Polyp of colon: Secondary | ICD-10-CM | POA: Diagnosis not present

## 2021-09-03 DIAGNOSIS — Z1211 Encounter for screening for malignant neoplasm of colon: Secondary | ICD-10-CM | POA: Diagnosis present

## 2021-09-03 HISTORY — PX: COLONOSCOPY WITH PROPOFOL: SHX5780

## 2021-09-03 SURGERY — COLONOSCOPY WITH PROPOFOL
Anesthesia: General

## 2021-09-03 MED ORDER — SODIUM CHLORIDE 0.9 % IV SOLN
INTRAVENOUS | Status: DC
  Administered 2021-09-03: 1000 mL via INTRAVENOUS

## 2021-09-03 MED ORDER — DEXMEDETOMIDINE (PRECEDEX) IN NS 20 MCG/5ML (4 MCG/ML) IV SYRINGE
PREFILLED_SYRINGE | INTRAVENOUS | Status: DC | PRN
  Administered 2021-09-03: 20 ug via INTRAVENOUS

## 2021-09-03 MED ORDER — PROPOFOL 10 MG/ML IV BOLUS
INTRAVENOUS | Status: DC | PRN
  Administered 2021-09-03: 100 mg via INTRAVENOUS
  Administered 2021-09-03: 30 mg via INTRAVENOUS

## 2021-09-03 MED ORDER — LIDOCAINE HCL (CARDIAC) PF 100 MG/5ML IV SOSY
PREFILLED_SYRINGE | INTRAVENOUS | Status: DC | PRN
  Administered 2021-09-03: 50 mg via INTRAVENOUS

## 2021-09-03 MED ORDER — PROPOFOL 500 MG/50ML IV EMUL
INTRAVENOUS | Status: DC | PRN
  Administered 2021-09-03: 150 ug/kg/min via INTRAVENOUS

## 2021-09-03 NOTE — Anesthesia Preprocedure Evaluation (Signed)
Anesthesia Evaluation  Patient identified by MRN, date of birth, ID band Patient awake    Reviewed: Allergy & Precautions, NPO status , Patient's Chart, lab work & pertinent test results  History of Anesthesia Complications Negative for: history of anesthetic complications  Airway Mallampati: III  TM Distance: >3 FB Neck ROM: Full    Dental no notable dental hx. (+) Teeth Intact Missing molars x4:   Pulmonary shortness of breath and with exertion, sleep apnea , neg COPD, Current Smoker and Patient abstained from smoking.,  Occasional smoker, has not smoked in weeks.   Pulmonary exam normal breath sounds clear to auscultation       Cardiovascular Exercise Tolerance: Poor METS: 3 - Mets hypertension, Pt. on medications (-) CAD and (-) Past MI Normal cardiovascular exam(-) dysrhythmias  Rhythm:Regular Rate:Normal - Systolic murmurs Ascending thoracic aneurysm 4.2 cm  ECG 06/25/21:  SR, 97bpm, LVH, nonspecific T wave changes  Echo 06/13/20 1. Left ventricular ejection fraction, by estimation, is 55 to 60%. The left ventricle has normal function. Left ventricular endocardial border not optimally defined to evaluate regional wall motion. There is moderate  left ventricular hypertrophy. Left ventricular diastolic parameters are consistent with Grade I diastolic  dysfunction (impaired relaxation).  2. Right ventricular systolic function is normal. The right ventricular size is mildly enlarged. Tricuspid regurgitation signal is inadequate for assessing PA pressure.  3. The mitral valve is normal in structure. No evidence of mitral valve regurgitation. No evidence of mitral stenosis.  4. The aortic valve has an indeterminant number of cusps. Aortic valve regurgitation is not visualized. No aortic stenosis is present.  5. Aortic dilatation noted. There is mild dilatation of the aortic root, measuring 40 mm.  6. Mildly dilated pulmonary  artery.   Neuro/Psych  Headaches, Seizures - (last sz 07/2020), Well Controlled,  PSYCHIATRIC DISORDERS Depression S/p lumbar fusion. No seizures in about a year CVA (04/2020, residual left foot and hand weakness), No Residual Symptoms    GI/Hepatic negative GI ROS, neg GERD  ,(+)     (-) substance abuse  ,   Endo/Other  neg diabetesMorbid obesityClass 3 obesity  Renal/GU negative Renal ROS     Musculoskeletal  (+) Arthritis ,   Abdominal (+) + obese,   Peds  Hematology negative hematology ROS (+)   Anesthesia Other Findings Past Medical History: No date: Arthritis No date: Depression No date: Headache No date: Hypertension     Comment:  uncontrolled No date: Seizure ([place])     Comment:  last seizure was beginning of 2022 No date: Sleep apnea     Comment:  has not f/u about getting cpap 04/2020: Stroke [facility])     Comment:  has affected memory and left sided weakness/will occ               drag left foot  Reproductive/Obstetrics                             Anesthesia Physical  Anesthesia Plan  ASA: 3  Anesthesia Plan: General   Post-op Pain Management: Minimal or no pain anticipated   Induction: Intravenous  PONV Risk Score and Plan: 1 and Propofol infusion, TIVA, Treatment may vary due to age or medical condition and Ondansetron  Airway Management Planned: Natural Airway and Nasal Cannula  Additional Equipment: None  Intra-op Plan:   Post-operative Plan:   Informed Consent: I have reviewed the patients History and Physical, chart, labs and  discussed the procedure including the risks, benefits and alternatives for the proposed anesthesia with the patient or authorized representative who has indicated his/her understanding and acceptance.     Dental advisory given  Plan Discussed with: CRNA  Anesthesia Plan Comments: (Discussed risks of anesthesia with patient, including possibility of difficulty with spontaneous ventilation  under anesthesia necessitating airway intervention, PONV, and rare risks such as cardiac or respiratory or neurological events, and allergic reactions. Discussed the role of CRNA in patient's perioperative care. Patient understands. Patient informed about increased incidence of above perioperative risk due to high BMI. Patient understands. )        Anesthesia Quick Evaluation

## 2021-09-03 NOTE — Anesthesia Postprocedure Evaluation (Signed)
Anesthesia Post Note  Patient: Jake Kirby  Procedure(s) Performed: COLONOSCOPY WITH PROPOFOL  Patient location during evaluation: Endoscopy Anesthesia Type: General Level of consciousness: awake and alert Pain management: pain level controlled Vital Signs Assessment: post-procedure vital signs reviewed and stable Respiratory status: spontaneous breathing, nonlabored ventilation, respiratory function stable and patient connected to nasal cannula oxygen Cardiovascular status: blood pressure returned to baseline and stable Postop Assessment: no apparent nausea or vomiting Anesthetic complications: no   No notable events documented.   Last Vitals:  Vitals:   09/03/21 0944 09/03/21 1138  BP: (!) 165/117   Pulse: 92   Resp: 16 20  Temp: (!) 36.1 C   SpO2: 98%     Last Pain:  Vitals:   09/03/21 1158  TempSrc:   PainSc: 0-No pain                 Arita Miss

## 2021-09-03 NOTE — Transfer of Care (Signed)
Immediate Anesthesia Transfer of Care Note  Patient: Jake Kirby  Procedure(s) Performed: COLONOSCOPY WITH PROPOFOL  Patient Location: PACU and Endoscopy Unit  Anesthesia Type:General  Level of Consciousness: drowsy  Airway & Oxygen Therapy: Patient Spontanous Breathing and Patient connected to face mask oxygen  Post-op Assessment: Report given to RN and Post -op Vital signs reviewed and stable  Post vital signs: Reviewed and stable  Last Vitals:  Vitals Value Taken Time  BP 99/70 09/03/21 1128  Temp    Pulse 72 09/03/21 1129  Resp    SpO2 94 % 09/03/21 1129  Vitals shown include unvalidated device data.  Last Pain:  Vitals:   09/03/21 0944  TempSrc: Temporal  PainSc: 0-No pain         Complications: No notable events documented.

## 2021-09-03 NOTE — H&P (Signed)
Jonathon Bellows, MD [address], [place], [place], [place], [address] [address], [place], [place], [place], [address] Phone: [phone]  Fax: [phone]  Primary Care Physician:  Cletis Athens, MD   Pre-Procedure History & Physical: HPI:  Jake Kirby is a 56 y.o. male is here for an colonoscopy.   Past Medical History:  Diagnosis Date   Arthritis    Depression    Headache    Hypertension    uncontrolled   Seizure ([place])    last seizure was beginning of 2022   Sleep apnea    has not f/u about getting cpap   Stroke [facility]) 04/2020   has affected memory and left sided weakness/will occ drag left foot    Past Surgical History:  Procedure Laterality Date   BACK SURGERY     lumbar fusion   COLONOSCOPY WITH PROPOFOL N/A 02/12/2019   Procedure: COLONOSCOPY WITH PROPOFOL;  Surgeon: Virgel Manifold, MD;  Location: [facility] ENDOSCOPY;  Service: Endoscopy;  Laterality: N/A;   JOINT REPLACEMENT     TOTAL HIP ARTHROPLASTY Right 07/18/2021   Procedure: TOTAL HIP ARTHROPLASTY ANTERIOR APPROACH;  Surgeon: Lovell Sheehan, MD;  Location: [facility] ORS;  Service: Orthopedics;  Laterality: Right;    Prior to Admission medications   Medication Sig Start Date End Date Taking? Authorizing Provider  amLODipine (NORVASC) 5 MG tablet Take 2 tablets (10 mg total) by mouth daily. 06/30/20  Yes Beckie Salts, FNP  aspirin 81 MG chewable tablet Chew 1 tablet (81 mg total) by mouth 2 (two) times daily. 07/19/21  Yes Carlynn Spry, PA-C  atorvastatin (LIPITOR) 20 MG tablet Take 1 tablet (20 mg total) by mouth daily. 07/24/21  Yes Masoud, Viann Shove, MD  docusate sodium (COLACE) 100 MG capsule Take 1 capsule (100 mg total) by mouth 2 (two) times daily. 07/19/21  Yes Carlynn Spry, PA-C  hydrALAZINE (APRESOLINE) 25 MG tablet Take 1 tablet (25 mg total) by mouth in the morning and at bedtime. 06/20/20  Yes Loel Dubonnet, NP  isosorbide mononitrate (IMDUR) 120 MG 24 hr tablet Take 1 tablet (120 mg  total) by mouth daily. 07/31/21  Yes Furth, Cadence H, PA-C  levETIRAcetam (KEPPRA) 500 MG tablet Take 1 tablet (500 mg total) by mouth 2 (two) times daily. 07/30/21  Yes Lomax, Amy, NP  lisinopril (ZESTRIL) 40 MG tablet Take 1 tablet (40 mg total) by mouth daily. 06/25/21 09/23/21 Yes Furth, Cadence H, PA-C  methocarbamol (ROBAXIN) 500 MG tablet Take 1 tablet (500 mg total) by mouth 3 (three) times daily. 07/19/21  Yes Carlynn Spry, PA-C  Sod Picosulfate-Mag Ox-Cit Acd (CLENPIQ) 10-3.5-12 MG-GM -GM/160ML SOLN Take 1 bottle at 5 PM followed by five 8 oz cups of water and repeat 5 hours before procedure. 08/30/21  Yes Jonathon Bellows, MD  ibuprofen (ADVIL) 800 MG tablet Take 800 mg by mouth every 8 (eight) hours as needed for moderate pain.    [provider]  oxyCODONE (OXY IR/ROXICODONE) 5 MG immediate release tablet Take 1 tablet (5 mg total) by mouth every 4 (four) hours as needed for moderate pain (pain). Patient not taking: Reported on 09/03/2021 07/19/21   Carlynn Spry, PA-C    Allergies as of 08/22/2021   (No Known Allergies)    Family History  Problem Relation Age of Onset   Hypertension Mother    Diabetes Mother    Heart attack Father 28    Social History   Socioeconomic History   Marital status: Married  Spouse name: Not on file   Number of children: 3   Years of education: some college   Highest education level: Not on file  Occupational History   Occupation: Firefighter  Tobacco Use   Smoking status: Light Smoker    Types: Cigarettes   Smokeless tobacco: Never   Tobacco comments:    social smoker when having a beer.   Vaping Use   Vaping Use: Never used  Substance and Sexual Activity   Alcohol use: Yes    Comment: social - at most 5 beers per month (smoked consistently from ages [age])   Drug use: No   Sexual activity: Not on file  Other Topics Concern   Not on file  Social History Narrative   Lives with family.   Right-handed.   3 biological children,  3 stepchildren   Caffeine use: 2 cups per day.   Social Determinants of Health   Financial Resource Strain: Not on file  Food Insecurity: Not on file  Transportation Needs: Not on file  Physical Activity: Not on file  Stress: Not on file  Social Connections: Not on file  Intimate Partner Violence: Not on file    Review of Systems: See HPI, otherwise negative ROS  Physical Exam: BP (!) 165/117    Pulse 92    Temp (!) 97 F (36.1 C) (Temporal)    Resp 16    Ht 5\' 8"  (1.727 m)    Wt 122.7 kg    SpO2 98%    BMI 41.14 kg/m  General:   Alert,  pleasant and cooperative in NAD Head:  Normocephalic and atraumatic. Neck:  Supple; no masses or thyromegaly. Lungs:  Clear throughout to auscultation, normal respiratory effort.    Heart:  +S1, +S2, Regular rate and rhythm, No edema. Abdomen:  Soft, nontender and nondistended. Normal bowel sounds, without guarding, and without rebound.   Neurologic:  Alert and  oriented x4;  grossly normal neurologically.  Impression/Plan: PERRIN GENS is here for an colonoscopy to be performed for Screening colonoscopy average risk   Risks, benefits, limitations, and alternatives regarding  colonoscopy have been reviewed with the patient.  Questions have been answered.  All parties agreeable.   Jonathon Bellows, MD  09/03/2021, 10:52 AM

## 2021-09-03 NOTE — Anesthesia Procedure Notes (Signed)
Procedure Name: MAC Date/Time: 09/03/2021 11:11 AM Performed by: Biagio Borg, CRNA Pre-anesthesia Checklist: Patient identified, Emergency Drugs available, Suction available, Patient being monitored and Timeout performed Patient Re-evaluated:Patient Re-evaluated prior to induction Oxygen Delivery Method: Supernova nasal CPAP Induction Type: IV induction Placement Confirmation: positive ETCO2 and CO2 detector

## 2021-09-03 NOTE — Op Note (Signed)
[facility] Gastroenterology Patient Name: Jake Kirby Procedure Date: 09/03/2021 11:04 AM MRN: [id number] Account #: [id number] Date of Birth: 1965-09-24 Admit Type: Outpatient Age: 56 Room: [facility] ENDO ROOM 4 Gender: Male Note Status: Finalized Instrument Name: Park Meo [id number] Procedure:             Colonoscopy Indications:           Screening for colorectal malignant neoplasm Providers:             Jonathon Bellows MD, MD Referring MD:          Cletis Athens, MD (Referring MD) Medicines:             Monitored Anesthesia Care Complications:         No immediate complications. Procedure:             Pre-Anesthesia Assessment:                        - Prior to the procedure, a History and Physical was                         performed, and patient medications, allergies and                         sensitivities were reviewed. The patient's tolerance                         of previous anesthesia was reviewed.                        - The risks and benefits of the procedure and the                         sedation options and risks were discussed with the                         patient. All questions were answered and informed                         consent was obtained.                        - ASA Grade Assessment: II - A patient with mild                         systemic disease.                        After obtaining informed consent, the colonoscope was                         passed under direct vision. Throughout the procedure,                         the patient's blood pressure, pulse, and oxygen                         saturations were monitored continuously. The                         Colonoscope was introduced  through the anus and                         advanced to the the cecum, identified by the                         appendiceal orifice. The colonoscopy was performed                         with ease. The patient tolerated the procedure well.                          The quality of the bowel preparation was poor. Findings:      The perianal and digital rectal examinations were normal.      A 5 mm polyp was found in the sigmoid colon. The polyp was sessile. The       polyp was removed with a cold snare. Resection and retrieval were       complete.      Extensive amounts of semi-liquid stool was found in the entire colon,       interfering with visualization. Impression:            - Preparation of the colon was poor.                        - One 5 mm polyp in the sigmoid colon, removed with a                         cold snare. Resected and retrieved.                        - Stool in the entire examined colon. Recommendation:        - Discharge patient to home (with escort).                        - Resume previous diet.                        - Continue present medications.                        - Repeat colonoscopy in 1 week because the bowel                         preparation was suboptimal. Procedure Code(s):     --- Professional ---                        [phone], Colonoscopy, flexible; with removal of                         tumor(s), polyp(s), or other lesion(s) by snare                         technique Diagnosis Code(s):     --- Professional ---                        Z12.11, Encounter for screening for malignant neoplasm  of colon                        K63.5, Polyp of colon CPT copyright 2019 American Medical Association. All rights reserved. The codes documented in this report are preliminary and upon coder review may  be revised to meet current compliance requirements. Jonathon Bellows, MD Jonathon Bellows MD, MD 09/03/2021 11:21:31 AM This report has been signed electronically. Number of Addenda: 0 Note Initiated On: 09/03/2021 11:04 AM Scope Withdrawal Time: 0 hours 2 minutes 29 seconds  Total Procedure Duration: 0 hours 9 minutes 16 seconds  Estimated Blood Loss:  Estimated blood loss: none.       [facility]

## 2021-09-04 ENCOUNTER — Encounter: Payer: Self-pay | Admitting: Gastroenterology

## 2021-09-04 ENCOUNTER — Other Ambulatory Visit: Payer: Self-pay

## 2021-09-04 DIAGNOSIS — Z1211 Encounter for screening for malignant neoplasm of colon: Secondary | ICD-10-CM

## 2021-09-04 LAB — SURGICAL PATHOLOGY

## 2021-09-04 MED ORDER — CLENPIQ 10-3.5-12 MG-GM -GM/160ML PO SOLN: ORAL | 0 refills | Status: DC

## 2021-09-12 ENCOUNTER — Encounter: Payer: Self-pay | Admitting: Gastroenterology

## 2021-09-13 ENCOUNTER — Encounter: Payer: Self-pay | Admitting: Anesthesiology

## 2021-09-13 ENCOUNTER — Ambulatory Visit
Admission: RE | Admit: 2021-09-13 | Discharge: 2021-09-13 | Disposition: A | Discharge status: Home / Self Care | Payer: Commercial Managed Care - PPO | Source: Ambulatory Visit | Attending: Gastroenterology | Admitting: Gastroenterology

## 2021-09-13 ENCOUNTER — Encounter: Payer: Self-pay | Admitting: Gastroenterology

## 2021-09-13 ENCOUNTER — Telehealth: Payer: Self-pay

## 2021-09-13 ENCOUNTER — Encounter
Admission: RE | Disposition: A | Discharge status: Home / Self Care | Payer: Self-pay | Source: Ambulatory Visit | Attending: Gastroenterology

## 2021-09-13 DIAGNOSIS — K635 Polyp of colon: Secondary | ICD-10-CM | POA: Insufficient documentation

## 2021-09-13 DIAGNOSIS — Z539 Procedure and treatment not carried out, unspecified reason: Secondary | ICD-10-CM | POA: Insufficient documentation

## 2021-09-13 DIAGNOSIS — Z1211 Encounter for screening for malignant neoplasm of colon: Secondary | ICD-10-CM | POA: Diagnosis present

## 2021-09-13 HISTORY — PX: COLONOSCOPY WITH PROPOFOL: SHX5780

## 2021-09-13 SURGERY — COLONOSCOPY WITH PROPOFOL
Anesthesia: General

## 2021-09-13 MED ORDER — SODIUM CHLORIDE 0.9 % IV SOLN: INTRAVENOUS | Status: DC

## 2021-09-13 NOTE — Telephone Encounter (Signed)
Dr. Vicente Males sent me a secure chat to let me know that the patient was not cleaned out. Therefore, to contact him and reschedule patient's colonoscopy. I then called the patient and had to leave him a voicemail to call us back. If the patient calls back, please transfer the call to [facility] or [place]. Thank you.

## 2021-09-13 NOTE — Anesthesia Preprocedure Evaluation (Deleted)
Anesthesia Evaluation  Patient identified by MRN, date of birth, ID band Patient awake    Reviewed: Allergy & Precautions, NPO status , Patient's Chart, lab work & pertinent test results  History of Anesthesia Complications Negative for: history of anesthetic complications  Airway Mallampati: IV   Neck ROM: Full    Dental   Missing molars x4:   Pulmonary sleep apnea , Current Smoker and Patient abstained from smoking.,    Pulmonary exam normal breath sounds clear to auscultation       Cardiovascular hypertension, Normal cardiovascular exam Rhythm:Regular Rate:Normal  Ascending thoracic aneurysm 4.2 cm  ECG 06/25/21:  SR, 97bpm, LVH, nonspecific T wave changes  Echo 06/13/20 1. Left ventricular ejection fraction, by estimation, is 55 to 60%. The left ventricle has normal function. Left ventricular endocardial border not optimally defined to evaluate regional wall motion. There is moderate  left ventricular hypertrophy. Left ventricular diastolic parameters are consistent with Grade I diastolic  dysfunction (impaired relaxation).  2. Right ventricular systolic function is normal. The right ventricular size is mildly enlarged. Tricuspid regurgitation signal is inadequate for assessing PA pressure.  3. The mitral valve is normal in structure. No evidence of mitral valve regurgitation. No evidence of mitral stenosis.  4. The aortic valve has an indeterminant number of cusps. Aortic valve regurgitation is not visualized. No aortic stenosis is present.  5. Aortic dilatation noted. There is mild dilatation of the aortic root, measuring 40 mm.  6. Mildly dilated pulmonary artery.   Neuro/Psych  Headaches, Seizures - (last sz 07/2020),  PSYCHIATRIC DISORDERS Depression S/p lumbar fusion CVA (04/2020, residual left foot and hand weakness)    GI/Hepatic negative GI ROS,   Endo/Other  Class 3 obesity  Renal/GU negative Renal ROS      Musculoskeletal  (+) Arthritis ,   Abdominal   Peds  Hematology negative hematology ROS (+)   Anesthesia Other Findings Cardiology note 07/31/21:  1. Hx of Shortness of breath, chest pain -Echocardiogram 11/21 LVEF 55 to 60%, grade 1 DD, moderate LVH, no significant valvular abnormalities, mild dilated aortic root 44 mm. -Cardiac CT for ischemic evaluation showed: coronary calcium score of 0 -No further chest pain or significant SOB  2. Mild dilation of aortic root (15mm) by Echo 11/21 -Likely cause uncontrolled HTN -Continue to monitor HTN closely -CT scan 07/24/2020 ascending thoracic aorta measuring 4.2 cm -Continue annual surveillance  3. HTN -Bp today is 150/100 -At home he has been [id number] systolic.  -Encouraged a low-sodium diet, he does admit to eating a lot of salty fast foods -Encouraged increase in activity level -Increase Imdur to 120mg  daily -Continue Lisinopril and Norvasc at current dose  4. OSA -Continue CPAP in the context of HTN  5. Seizure disorder -Continue f/u with neurology -On Keppra and tolerating  6. Morbid obesity -weight loss discussed with diet and exercise modifications. -Discussed implications of weight in the setting of HTN - He plans to increase his activity level with his new hip replacement  7. Hyperlipidemia- -Last lipid panel showed triglycerides 79, LDL 178, HDL 39, total cholesterol 235 -He is currently on Lipitor 20 mg daily -He will need a repeat lipid panel the next time he is in the office and medications titrated if appropriate  Disposition: Follow up in 3 months with Nelva Bush, MD or APP.  Reproductive/Obstetrics  Anesthesia Physical  Anesthesia Plan  ASA: 4  Anesthesia Plan: General   Post-op Pain Management:    Induction: Intravenous  PONV Risk Score and Plan: 1 and Propofol infusion, TIVA and Treatment may vary due to age or medical  condition  Airway Management Planned: Natural Airway  Additional Equipment:   Intra-op Plan:   Post-operative Plan:   Informed Consent: I have reviewed the patients History and Physical, chart, labs and discussed the procedure including the risks, benefits and alternatives for the proposed anesthesia with the patient or authorized representative who has indicated his/her understanding and acceptance.       Plan Discussed with: CRNA  Anesthesia Plan Comments: (LMA/GETA backup discussed.  Patient consented for risks of anesthesia including but not limited to:  - adverse reactions to medications - damage to eyes, teeth, lips or other oral mucosa - nerve damage due to positioning  - sore throat or hoarseness - damage to heart, brain, nerves, lungs, other parts of body or loss of life  Informed patient about role of CRNA in peri- and intra-operative care.  Patient voiced understanding.)        Anesthesia Quick Evaluation

## 2021-09-14 ENCOUNTER — Other Ambulatory Visit: Payer: Self-pay

## 2021-09-14 ENCOUNTER — Encounter: Payer: Self-pay | Admitting: Gastroenterology

## 2021-09-14 ENCOUNTER — Telehealth: Payer: Self-pay

## 2021-09-14 DIAGNOSIS — Z1211 Encounter for screening for malignant neoplasm of colon: Secondary | ICD-10-CM

## 2021-09-14 MED ORDER — NA SULFATE-K SULFATE-MG SULF 17.5-3.13-1.6 GM/177ML PO SOLN: 354.0000 mL | Freq: Once | ORAL | 0 refills | Status: AC

## 2021-09-14 NOTE — Telephone Encounter (Signed)
Patient states he needs to schedule another colonoscopy. Please call to schedule

## 2021-09-14 NOTE — Telephone Encounter (Signed)
Called patient gain and left him a voicemail to call us back. I will also send him a MyChart message and hope to hear from him soon.

## 2021-09-14 NOTE — Addendum Note (Signed)
Addended by: Wayna Chalet on: 09/14/2021 06:53 PM   Modules accepted: Orders

## 2021-09-14 NOTE — Telephone Encounter (Signed)
Patient rescheduled his procedure to 09/18/2021.

## 2021-09-14 NOTE — Telephone Encounter (Signed)
Called patient again and he agreed on getting his colonoscopy scheduled for 09/18/21.

## 2021-09-17 ENCOUNTER — Other Ambulatory Visit: Payer: Self-pay | Admitting: *Deleted

## 2021-09-17 ENCOUNTER — Encounter: Payer: Self-pay | Admitting: Gastroenterology

## 2021-09-17 DIAGNOSIS — I10 Essential (primary) hypertension: Secondary | ICD-10-CM

## 2021-09-17 MED ORDER — AMLODIPINE BESYLATE 5 MG PO TABS: 10.0000 mg | ORAL_TABLET | Freq: Every day | ORAL | 0 refills | Status: DC

## 2021-09-17 NOTE — Anesthesia Preprocedure Evaluation (Addendum)
Anesthesia Evaluation  Patient identified by MRN, date of birth, ID band Patient awake    Reviewed: Allergy & Precautions, NPO status , Patient's Chart, lab work & pertinent test results  History of Anesthesia Complications Negative for: history of anesthetic complications  Airway Mallampati: III  TM Distance: >3 FB Neck ROM: Full    Dental no notable dental hx. (+) Teeth Intact Missing molars x4:   Pulmonary shortness of breath and with exertion, sleep apnea , neg COPD, Current Smoker and Patient abstained from smoking.,  Occasional smoker, has not smoked in weeks.   Pulmonary exam normal breath sounds clear to auscultation       Cardiovascular Exercise Tolerance: Poor METS: 3 - Mets hypertension, Pt. on medications (-) CAD and (-) Past MI Normal cardiovascular exam(-) dysrhythmias  Rhythm:Regular Rate:Normal - Systolic murmurs Ascending thoracic aneurysm 4.2 cm  ECG 06/25/21:  SR, 97bpm, LVH, nonspecific T wave changes  Echo 06/13/20 1. Left ventricular ejection fraction, by estimation, is 55 to 60%. The left ventricle has normal function. Left ventricular endocardial border not optimally defined to evaluate regional wall motion. There is moderate  left ventricular hypertrophy. Left ventricular diastolic parameters are consistent with Grade I diastolic  dysfunction (impaired relaxation).  2. Right ventricular systolic function is normal. The right ventricular size is mildly enlarged. Tricuspid regurgitation signal is inadequate for assessing PA pressure.  3. The mitral valve is normal in structure. No evidence of mitral valve regurgitation. No evidence of mitral stenosis.  4. The aortic valve has an indeterminant number of cusps. Aortic valve regurgitation is not visualized. No aortic stenosis is present.  5. Aortic dilatation noted. There is mild dilatation of the aortic root, measuring 40 mm.  6. Mildly dilated pulmonary  artery.   Neuro/Psych Seizures - (last sz 07/2020), Well Controlled,  PSYCHIATRIC DISORDERS Depression S/p lumbar fusion.  CVA (04/2020, residual left foot and hand weakness), Residual Symptoms    GI/Hepatic negative GI ROS, neg GERD  ,(+)     (-) substance abuse  ,   Endo/Other  neg diabetesMorbid obesityClass 3 obesity  Renal/GU negative Renal ROS     Musculoskeletal  (+) Arthritis ,   Abdominal (+) + obese,   Peds  Hematology negative hematology ROS (+)   Anesthesia Other Findings Past Medical History: No date: Arthritis No date: Depression No date: Headache No date: Hypertension     Comment:  uncontrolled No date: Seizure ([place])     Comment:  last seizure was beginning of 2022 No date: Sleep apnea     Comment:  has not f/u about getting cpap 04/2020: Stroke [facility])     Comment:  has affected memory and left sided weakness/will occ               drag left foot  Reproductive/Obstetrics                            Anesthesia Physical  Anesthesia Plan  ASA: 3  Anesthesia Plan: General   Post-op Pain Management: Minimal or no pain anticipated   Induction: Intravenous  PONV Risk Score and Plan: 1 and Propofol infusion, TIVA, Treatment may vary due to age or medical condition and Ondansetron  Airway Management Planned: Natural Airway and Simple Face Mask  Additional Equipment: None  Intra-op Plan:   Post-operative Plan:   Informed Consent: I have reviewed the patients History and Physical, chart, labs and discussed the procedure including the risks, benefits and  alternatives for the proposed anesthesia with the patient or authorized representative who has indicated his/her understanding and acceptance.     Dental advisory given  Plan Discussed with: CRNA  Anesthesia Plan Comments: ( )       Anesthesia Quick Evaluation

## 2021-09-18 ENCOUNTER — Ambulatory Visit: Payer: Commercial Managed Care - PPO | Admitting: Anesthesiology

## 2021-09-18 ENCOUNTER — Encounter
Admission: RE | Disposition: A | Discharge status: Home / Self Care | Payer: Self-pay | Source: Home / Self Care | Attending: Gastroenterology

## 2021-09-18 ENCOUNTER — Ambulatory Visit
Admission: RE | Admit: 2021-09-18 | Discharge: 2021-09-18 | Disposition: A | Discharge status: Home / Self Care | Payer: Commercial Managed Care - PPO | Attending: Gastroenterology | Admitting: Gastroenterology

## 2021-09-18 DIAGNOSIS — I10 Essential (primary) hypertension: Secondary | ICD-10-CM | POA: Diagnosis not present

## 2021-09-18 DIAGNOSIS — K5730 Diverticulosis of large intestine without perforation or abscess without bleeding: Secondary | ICD-10-CM | POA: Diagnosis not present

## 2021-09-18 DIAGNOSIS — F17210 Nicotine dependence, cigarettes, uncomplicated: Secondary | ICD-10-CM | POA: Diagnosis not present

## 2021-09-18 DIAGNOSIS — Z981 Arthrodesis status: Secondary | ICD-10-CM | POA: Diagnosis not present

## 2021-09-18 DIAGNOSIS — I69954 Hemiplegia and hemiparesis following unspecified cerebrovascular disease affecting left non-dominant side: Secondary | ICD-10-CM | POA: Diagnosis not present

## 2021-09-18 DIAGNOSIS — K635 Polyp of colon: Secondary | ICD-10-CM | POA: Diagnosis not present

## 2021-09-18 DIAGNOSIS — Z1211 Encounter for screening for malignant neoplasm of colon: Secondary | ICD-10-CM

## 2021-09-18 DIAGNOSIS — D122 Benign neoplasm of ascending colon: Secondary | ICD-10-CM | POA: Diagnosis not present

## 2021-09-18 DIAGNOSIS — K633 Ulcer of intestine: Secondary | ICD-10-CM | POA: Diagnosis not present

## 2021-09-18 DIAGNOSIS — Z6841 Body Mass Index (BMI) 40.0 and over, adult: Secondary | ICD-10-CM | POA: Insufficient documentation

## 2021-09-18 DIAGNOSIS — G4730 Sleep apnea, unspecified: Secondary | ICD-10-CM | POA: Insufficient documentation

## 2021-09-18 DIAGNOSIS — R569 Unspecified convulsions: Secondary | ICD-10-CM | POA: Insufficient documentation

## 2021-09-18 HISTORY — PX: COLONOSCOPY WITH PROPOFOL: SHX5780

## 2021-09-18 SURGERY — COLONOSCOPY WITH PROPOFOL
Anesthesia: General

## 2021-09-18 MED ORDER — SODIUM CHLORIDE 0.9 % IV SOLN: INTRAVENOUS | Status: DC

## 2021-09-18 MED ORDER — PROPOFOL 10 MG/ML IV BOLUS
INTRAVENOUS | Status: AC
  Filled 2021-09-18: qty 20

## 2021-09-18 MED ORDER — DEXMEDETOMIDINE (PRECEDEX) IN NS 20 MCG/5ML (4 MCG/ML) IV SYRINGE
PREFILLED_SYRINGE | INTRAVENOUS | Status: DC | PRN
Start: 2021-09-18 — End: 2021-09-18
  Administered 2021-09-18: 12 ug via INTRAVENOUS
  Administered 2021-09-18: 8 ug via INTRAVENOUS

## 2021-09-18 MED ORDER — PROPOFOL 500 MG/50ML IV EMUL
INTRAVENOUS | Status: DC | PRN
  Administered 2021-09-18: 100 ug/kg/min via INTRAVENOUS

## 2021-09-18 MED ORDER — PHENYLEPHRINE 40 MCG/ML (10ML) SYRINGE FOR IV PUSH (FOR BLOOD PRESSURE SUPPORT)
PREFILLED_SYRINGE | INTRAVENOUS | Status: AC
  Filled 2021-09-18: qty 10

## 2021-09-18 MED ORDER — PROPOFOL 10 MG/ML IV BOLUS
INTRAVENOUS | Status: DC | PRN
  Administered 2021-09-18: 70 mg via INTRAVENOUS

## 2021-09-18 MED ORDER — LIDOCAINE HCL (CARDIAC) PF 100 MG/5ML IV SOSY
PREFILLED_SYRINGE | INTRAVENOUS | Status: DC | PRN
Start: 2021-09-18 — End: 2021-09-18
  Administered 2021-09-18: 50 mg via INTRAVENOUS

## 2021-09-18 NOTE — Op Note (Signed)
[facility] Gastroenterology Patient Name: Jake Kirby Procedure Date: 09/18/2021 10:36 AM MRN: [id number] Account #: [id number] Date of Birth: Feb 16, 1966 Admit Type: Outpatient Age: 56 Room: [facility] ENDO ROOM 2 Gender: Male Note Status: Finalized Instrument Name: Park Meo [id number] Procedure:             Colonoscopy Indications:           Screening for colorectal malignant neoplasm Providers:             Jonathon Bellows MD, MD Referring MD:          Cletis Athens, MD (Referring MD) Medicines:             Monitored Anesthesia Care Complications:         No immediate complications. Procedure:             Pre-Anesthesia Assessment:                        - Prior to the procedure, a History and Physical was                         performed, and patient medications, allergies and                         sensitivities were reviewed. The patient's tolerance                         of previous anesthesia was reviewed.                        - The risks and benefits of the procedure and the                         sedation options and risks were discussed with the                         patient. All questions were answered and informed                         consent was obtained.                        - ASA Grade Assessment: II - A patient with mild                         systemic disease.                        After obtaining informed consent, the colonoscope was                         passed under direct vision. Throughout the procedure,                         the patient's blood pressure, pulse, and oxygen                         saturations were monitored continuously. The                         Colonoscope was introduced  through the anus and                         advanced to the the cecum, identified by the                         appendiceal orifice. The colonoscopy was performed                         with ease. The patient tolerated the procedure well.                          The quality of the bowel preparation was good. Findings:      The perianal and digital rectal examinations were normal.      A 5 mm polyp was found in the proximal ascending colon. The polyp was       sessile. The polyp was removed with a cold snare. Resection and       retrieval were complete.      A single (solitary) seven mm ulcer was found in the sigmoid colon. No       bleeding was present. No stigmata of recent bleeding were seen. Biopsies       were taken with a cold forceps for histology.      Multiple small-mouthed diverticula were found in the left colon.      The exam was otherwise without abnormality on direct and retroflexion       views. Impression:            - One 5 mm polyp in the proximal ascending colon,                         removed with a cold snare. Resected and retrieved.                        - A single (solitary) ulcer in the sigmoid colon.                         Biopsied.                        - Diverticulosis in the left colon.                        - The examination was otherwise normal on direct and                         retroflexion views. Recommendation:        - Discharge patient to home (with escort).                        - Resume previous diet.                        - Continue present medications.                        - Await pathology results.                        - Repeat colonoscopy for surveillance based on  pathology results. Procedure Code(s):     --- Professional ---                        [phone], Colonoscopy, flexible; with removal of                         tumor(s), polyp(s), or other lesion(s) by snare                         technique                        45380, 22, Colonoscopy, flexible; with biopsy, single                         or multiple Diagnosis Code(s):     --- Professional ---                        Z12.11, Encounter for screening for malignant neoplasm                          of colon                        K63.5, Polyp of colon                        K63.3, Ulcer of intestine                        K57.30, Diverticulosis of large intestine without                         perforation or abscess without bleeding CPT copyright 2019 American Medical Association. All rights reserved. The codes documented in this report are preliminary and upon coder review may  be revised to meet current compliance requirements. Jonathon Bellows, MD Jonathon Bellows MD, MD 09/18/2021 11:01:51 AM This report has been signed electronically. Number of Addenda: 0 Note Initiated On: 09/18/2021 10:36 AM Scope Withdrawal Time: 0 hours 9 minutes 8 seconds  Total Procedure Duration: 0 hours 11 minutes 18 seconds  Estimated Blood Loss:  Estimated blood loss: none.      [facility]

## 2021-09-18 NOTE — Anesthesia Procedure Notes (Signed)
Procedure Name: MAC Date/Time: 09/18/2021 10:43 AM Performed by: Biagio Borg, CRNA Pre-anesthesia Checklist: Patient identified, Emergency Drugs available, Suction available, Patient being monitored and Timeout performed Patient Re-evaluated:Patient Re-evaluated prior to induction Oxygen Delivery Method: Nasal cannula Induction Type: IV induction Placement Confirmation: positive ETCO2 and CO2 detector

## 2021-09-18 NOTE — H&P (Signed)
Jonathon Bellows, MD [address] [place], [place], [place], [id number], [place], [place], [place], [address] Phone: [phone]  Fax: [phone]  Primary Care Physician:  Cletis Athens, MD   Pre-Procedure History & Physical: HPI:  Jake Kirby is a 56 y.o. male is here for an colonoscopy.   Past Medical History:  Diagnosis Date   Arthritis    Depression    Headache    Hypertension    uncontrolled   Seizure ([place])    last seizure was beginning of 2022   Sleep apnea    has not f/u about getting cpap   Stroke [facility]) 04/2020   has affected memory and left sided weakness/will occ drag left foot    Past Surgical History:  Procedure Laterality Date   BACK SURGERY     lumbar fusion   COLONOSCOPY WITH PROPOFOL N/A 02/12/2019   Procedure: COLONOSCOPY WITH PROPOFOL;  Surgeon: Virgel Manifold, MD;  Location: [facility] ENDOSCOPY;  Service: Endoscopy;  Laterality: N/A;   COLONOSCOPY WITH PROPOFOL N/A 09/03/2021   Procedure: COLONOSCOPY WITH PROPOFOL;  Surgeon: Jonathon Bellows, MD;  Location: [facility] ENDOSCOPY;  Service: Gastroenterology;  Laterality: N/A;   COLONOSCOPY WITH PROPOFOL N/A 09/13/2021   Procedure: COLONOSCOPY WITH PROPOFOL;  Surgeon: Jonathon Bellows, MD;  Location: [facility] ENDOSCOPY;  Service: Gastroenterology;  Laterality: N/A;   JOINT REPLACEMENT     TOTAL HIP ARTHROPLASTY Right 07/18/2021   Procedure: TOTAL HIP ARTHROPLASTY ANTERIOR APPROACH;  Surgeon: Lovell Sheehan, MD;  Location: [facility] ORS;  Service: Orthopedics;  Laterality: Right;    Prior to Admission medications   Medication Sig Start Date End Date Taking? Authorizing Provider  amLODipine (NORVASC) 5 MG tablet Take 2 tablets (10 mg total) by mouth daily. 09/17/21  Yes Masoud, Viann Shove, MD  hydrALAZINE (APRESOLINE) 25 MG tablet Take 1 tablet (25 mg total) by mouth in the morning and at bedtime. 06/20/20  Yes Loel Dubonnet, NP  isosorbide mononitrate (IMDUR) 120 MG 24 hr tablet Take 1 tablet (120 mg total) by  mouth daily. 07/31/21  Yes Furth, Cadence H, PA-C  levETIRAcetam (KEPPRA) 500 MG tablet Take 1 tablet (500 mg total) by mouth 2 (two) times daily. 07/30/21  Yes Lomax, Amy, NP  lisinopril (ZESTRIL) 40 MG tablet Take 1 tablet (40 mg total) by mouth daily. 06/25/21 09/23/21 Yes Furth, Cadence H, PA-C  aspirin 81 MG chewable tablet Chew 1 tablet (81 mg total) by mouth 2 (two) times daily. 07/19/21   Carlynn Spry, PA-C  atorvastatin (LIPITOR) 20 MG tablet Take 1 tablet (20 mg total) by mouth daily. 07/24/21   Cletis Athens, MD  docusate sodium (COLACE) 100 MG capsule Take 1 capsule (100 mg total) by mouth 2 (two) times daily. 07/19/21   Carlynn Spry, PA-C  ibuprofen (ADVIL) 800 MG tablet Take 800 mg by mouth every 8 (eight) hours as needed for moderate pain.    [provider]  methocarbamol (ROBAXIN) 500 MG tablet Take 1 tablet (500 mg total) by mouth 3 (three) times daily. 07/19/21   Carlynn Spry, PA-C  oxyCODONE (OXY IR/ROXICODONE) 5 MG immediate release tablet Take 1 tablet (5 mg total) by mouth every 4 (four) hours as needed for moderate pain (pain). Patient not taking: Reported on 09/03/2021 07/19/21   Carlynn Spry, PA-C  Sod Picosulfate-Mag Ox-Cit Acd (CLENPIQ) 10-3.5-12 MG-GM -GM/160ML SOLN Take 1 bottle at 5 PM followed by five 8 oz cups of water and repeat 5 hours before procedure. 09/04/21  Jonathon Bellows, MD    Allergies as of 09/17/2021   (No Known Allergies)    Family History  Problem Relation Age of Onset   Hypertension Mother    Diabetes Mother    Heart attack Father 56    Social History   Socioeconomic History   Marital status: Married    Spouse name: Not on file   Number of children: 3   Years of education: some college   Highest education level: Not on file  Occupational History   Occupation: Firefighter  Tobacco Use   Smoking status: Light Smoker    Types: Cigarettes   Smokeless tobacco: Never   Tobacco comments:    social smoker when having a beer.    Vaping Use   Vaping Use: Never used  Substance and Sexual Activity   Alcohol use: Yes    Comment: social - at most 5 beers per month (smoked consistently from ages [age])   Drug use: No   Sexual activity: Not on file  Other Topics Concern   Not on file  Social History Narrative   Lives with family.   Right-handed.   3 biological children, 3 stepchildren   Caffeine use: 2 cups per day.   Social Determinants of Health   Financial Resource Strain: Not on file  Food Insecurity: Not on file  Transportation Needs: Not on file  Physical Activity: Not on file  Stress: Not on file  Social Connections: Not on file  Intimate Partner Violence: Not on file    Review of Systems: See HPI, otherwise negative ROS  Physical Exam: BP (!) 175/112    Pulse 72    Temp (!) 96.4 F (35.8 C) (Temporal)    Resp 18    Ht 5\' 8"  (1.727 m)    Wt 121.6 kg    SpO2 99%    BMI 40.75 kg/m  General:   Alert,  pleasant and cooperative in NAD Head:  Normocephalic and atraumatic. Neck:  Supple; no masses or thyromegaly. Lungs:  Clear throughout to auscultation, normal respiratory effort.    Heart:  +S1, +S2, Regular rate and rhythm, No edema. Abdomen:  Soft, nontender and nondistended. Normal bowel sounds, without guarding, and without rebound.   Neurologic:  Alert and  oriented x4;  grossly normal neurologically.  Impression/Plan: Jake Kirby is here for an colonoscopy to be performed for Screening colonoscopy average risk   Risks, benefits, limitations, and alternatives regarding  colonoscopy have been reviewed with the patient.  Questions have been answered.  All parties agreeable.   Jonathon Bellows, MD  09/18/2021, 10:37 AM

## 2021-09-18 NOTE — Transfer of Care (Signed)
Immediate Anesthesia Transfer of Care Note  Patient: Jake Kirby  Procedure(s) Performed: COLONOSCOPY WITH PROPOFOL  Patient Location: PACU and Endoscopy Unit  Anesthesia Type:General  Level of Consciousness: drowsy  Airway & Oxygen Therapy: Patient Spontanous Breathing  Post-op Assessment: Report given to RN and Post -op Vital signs reviewed and stable  Post vital signs: Reviewed and stable  Last Vitals:  Vitals Value Taken Time  BP 146/85 09/18/21 1059  Temp 36.1 C 09/18/21 1059  Pulse 76 09/18/21 1059  Resp 15 09/18/21 1059  SpO2      Last Pain:  Vitals:   09/18/21 1059  TempSrc: Temporal  PainSc: Asleep         Complications: No notable events documented.

## 2021-09-19 ENCOUNTER — Encounter: Payer: Self-pay | Admitting: Gastroenterology

## 2021-09-19 LAB — SURGICAL PATHOLOGY

## 2021-09-19 NOTE — Anesthesia Postprocedure Evaluation (Signed)
Anesthesia Post Note  Patient: Jake Kirby  Procedure(s) Performed: COLONOSCOPY WITH PROPOFOL  Patient location during evaluation: Endoscopy Anesthesia Type: General Level of consciousness: awake and alert Pain management: pain level controlled Vital Signs Assessment: post-procedure vital signs reviewed and stable Respiratory status: spontaneous breathing, nonlabored ventilation and respiratory function stable Cardiovascular status: blood pressure returned to baseline and stable Postop Assessment: no apparent nausea or vomiting Anesthetic complications: no   No notable events documented.   Last Vitals:  Vitals:   09/18/21 1109 09/18/21 1119  BP: (!) 139/95 (!) 155/100  Pulse: 77 75  Resp: 19 16  Temp:    SpO2: 98% 98%    Last Pain:  Vitals:   09/19/21 0743  TempSrc:   PainSc: 0-No pain                 [place] Ouch

## 2021-09-20 ENCOUNTER — Encounter: Payer: Self-pay | Admitting: Gastroenterology

## 2021-10-16 IMAGING — CR DG CHEST 2V
2 series · 2 of 2 positions shown · non-contrast
Comparison: 04/27/2020

CLINICAL DATA: Chest pain

EXAM:
CHEST - 2 VIEW

[chest lat]
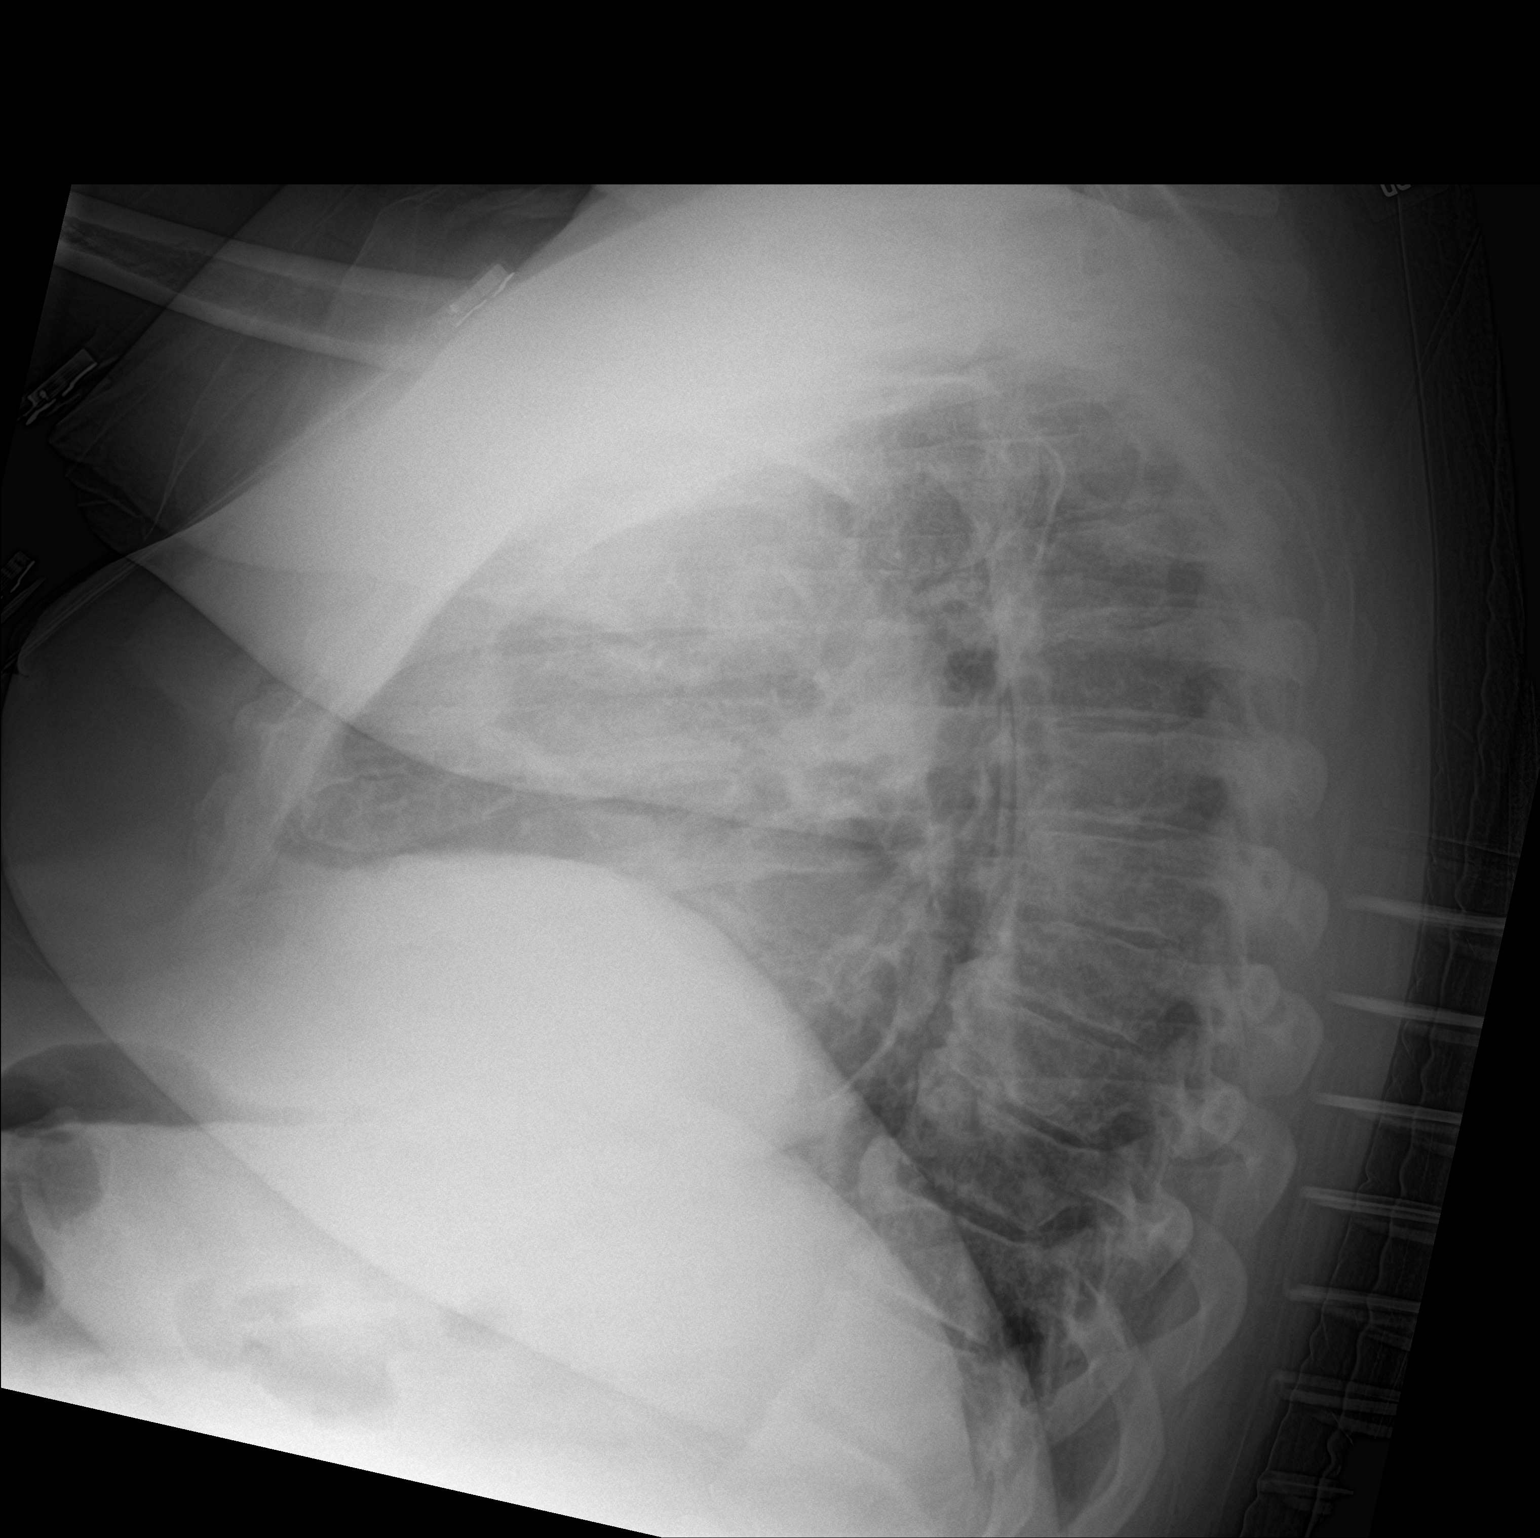

[chest ap]
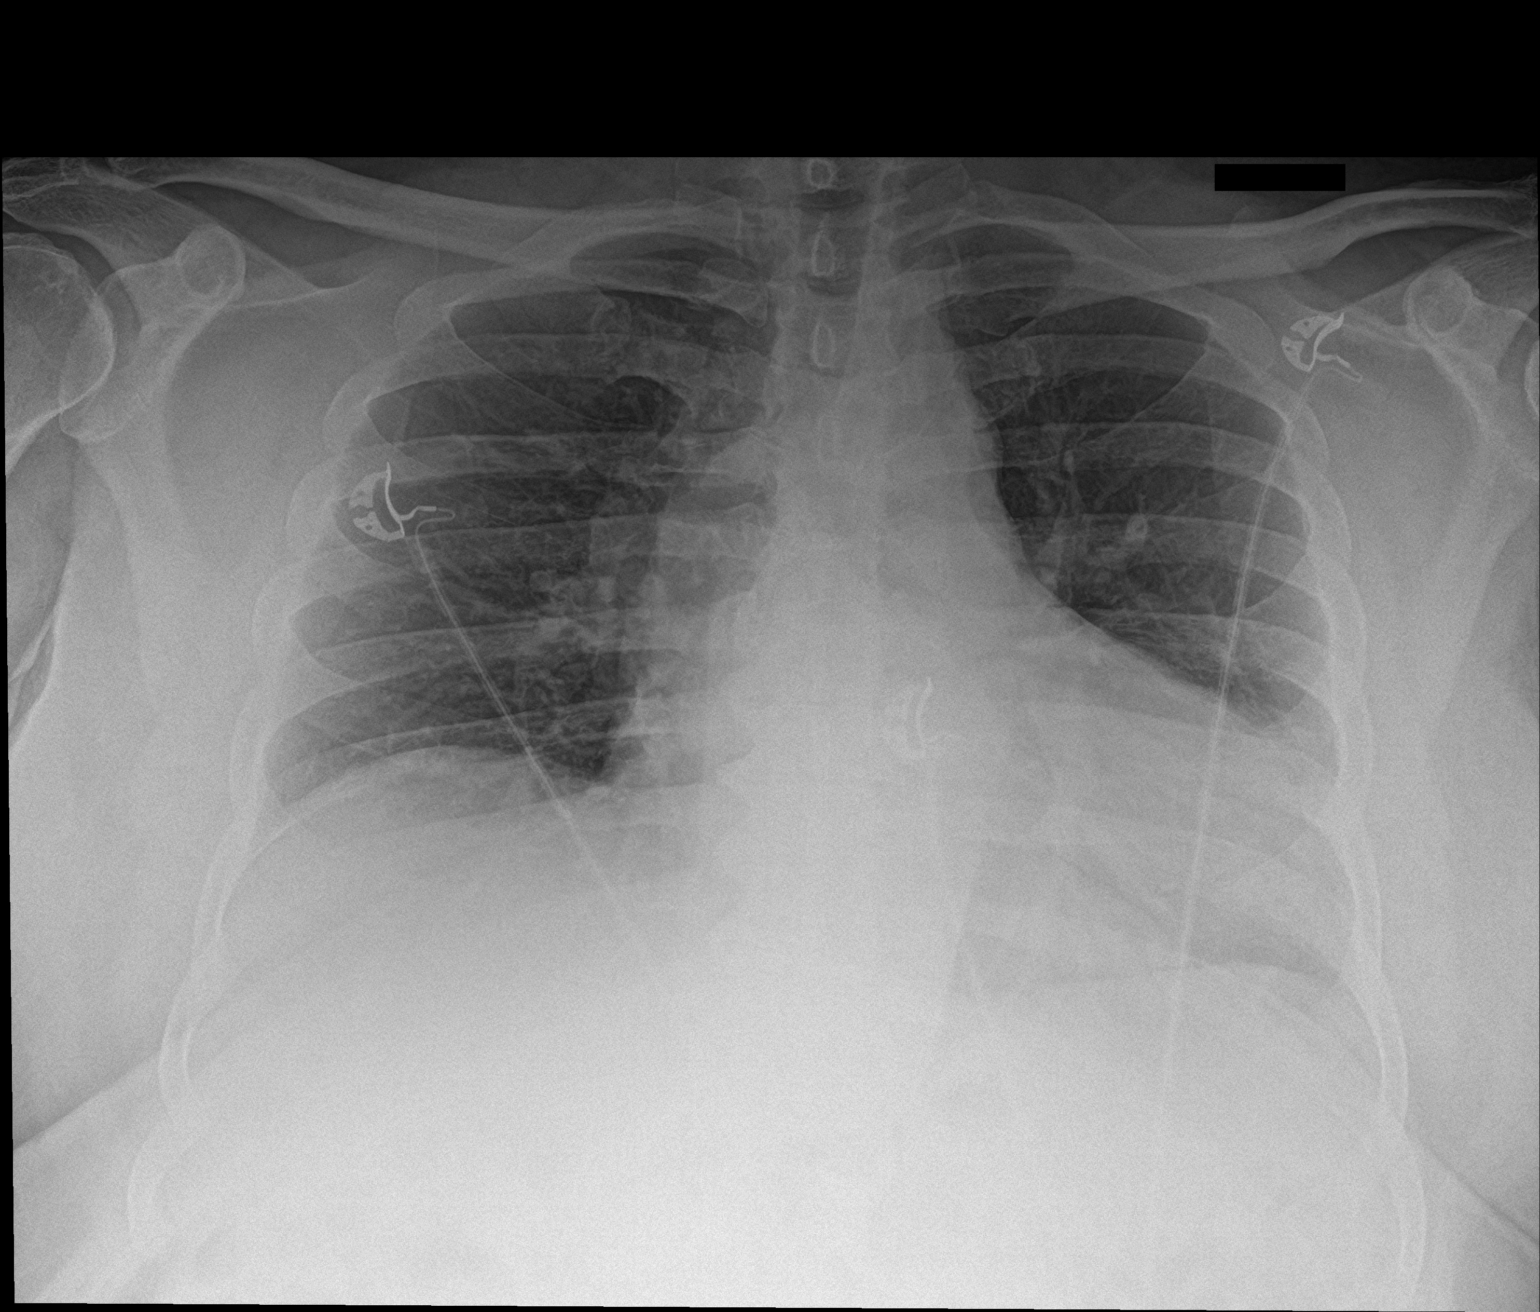

[2 of 2 positions shown; findings below may reference images not displayed]

FINDINGS: Low lung volumes. Mild cardiomegaly. No focal opacity, pleural
effusion or pneumothorax.
IMPRESSION: No active cardiopulmonary disease. Mild cardiomegaly.

## 2021-10-31 ENCOUNTER — Ambulatory Visit: Payer: Commercial Managed Care - PPO | Admitting: Internal Medicine

## 2021-11-01 ENCOUNTER — Encounter: Payer: Self-pay | Admitting: Internal Medicine

## 2022-03-19 ENCOUNTER — Encounter: Payer: Self-pay | Admitting: Internal Medicine

## 2022-03-19 ENCOUNTER — Ambulatory Visit: Payer: Commercial Managed Care - PPO | Admitting: Internal Medicine

## 2022-03-19 DIAGNOSIS — G8929 Other chronic pain: Secondary | ICD-10-CM

## 2022-03-19 DIAGNOSIS — R569 Unspecified convulsions: Secondary | ICD-10-CM

## 2022-03-19 DIAGNOSIS — M5441 Lumbago with sciatica, right side: Secondary | ICD-10-CM

## 2022-03-19 DIAGNOSIS — Z125 Encounter for screening for malignant neoplasm of prostate: Secondary | ICD-10-CM

## 2022-03-19 DIAGNOSIS — M5442 Lumbago with sciatica, left side: Secondary | ICD-10-CM

## 2022-03-19 DIAGNOSIS — Z720 Tobacco use: Secondary | ICD-10-CM | POA: Diagnosis not present

## 2022-03-19 DIAGNOSIS — M5416 Radiculopathy, lumbar region: Secondary | ICD-10-CM

## 2022-03-19 DIAGNOSIS — Z23 Encounter for immunization: Secondary | ICD-10-CM | POA: Insufficient documentation

## 2022-03-19 DIAGNOSIS — E785 Hyperlipidemia, unspecified: Secondary | ICD-10-CM

## 2022-03-19 DIAGNOSIS — I10 Essential (primary) hypertension: Secondary | ICD-10-CM | POA: Diagnosis not present

## 2022-03-19 DIAGNOSIS — Z1329 Encounter for screening for other suspected endocrine disorder: Secondary | ICD-10-CM

## 2022-03-19 NOTE — Assessment & Plan Note (Signed)
Patient was advised to continue antiseizure medication

## 2022-03-19 NOTE — Assessment & Plan Note (Signed)
Patient can get a tetanus shot, he works in the [employer] and is exposed to injury.

## 2022-03-19 NOTE — Progress Notes (Signed)
Established Patient Office Visit  Subjective:  Patient ID: Jake Kirby, male    DOB: 10/02/1965  Age: 56 y.o. MRN: [id number]  CC:  Chief Complaint  Patient presents with   Back Pain    Pt reports sciatic pains on Rt side, notes keeps him up at night pt is requesting meds for pain as this is also preventing him from working, has had this issue in the past but this is recent flare started Friday night    Health Maintenance    Pt would like to get Shingrix and the Tetanus booster if possible today as he is due at this time     Back Pain    Jake Kirby presents for check up  Past Medical History:  Diagnosis Date   Arthritis    Depression    Headache    Hypertension    uncontrolled   Seizure ([place])    last seizure was beginning of 2022   Sleep apnea    has not f/u about getting cpap   Stroke ([place]) 04/2020   has affected memory and left sided weakness/will occ drag left foot    Past Surgical History:  Procedure Laterality Date   BACK SURGERY     lumbar fusion   COLONOSCOPY WITH PROPOFOL N/A 02/12/2019   Procedure: COLONOSCOPY WITH PROPOFOL;  Surgeon: Virgel Manifold, MD;  Location: [facility] ENDOSCOPY;  Service: Endoscopy;  Laterality: N/A;   COLONOSCOPY WITH PROPOFOL N/A 09/03/2021   Procedure: COLONOSCOPY WITH PROPOFOL;  Surgeon: Jonathon Bellows, MD;  Location: [facility] ENDOSCOPY;  Service: Gastroenterology;  Laterality: N/A;   COLONOSCOPY WITH PROPOFOL N/A 09/13/2021   Procedure: COLONOSCOPY WITH PROPOFOL;  Surgeon: Jonathon Bellows, MD;  Location: [facility] ENDOSCOPY;  Service: Gastroenterology;  Laterality: N/A;   COLONOSCOPY WITH PROPOFOL N/A 09/18/2021   Procedure: COLONOSCOPY WITH PROPOFOL;  Surgeon: Jonathon Bellows, MD;  Location: [facility] ENDOSCOPY;  Service: Gastroenterology;  Laterality: N/A;   JOINT REPLACEMENT     TOTAL HIP ARTHROPLASTY Right 07/18/2021   Procedure: TOTAL HIP ARTHROPLASTY ANTERIOR APPROACH;  Surgeon: Lovell Sheehan, MD;  Location: [facility] ORS;  Service: Orthopedics;   Laterality: Right;    Family History  Problem Relation Age of Onset   Hypertension Mother    Diabetes Mother    Heart attack Father 70    Social History   Socioeconomic History   Marital status: Married    Spouse name: Not on file   Number of children: 3   Years of education: some college   Highest education level: Not on file  Occupational History   Occupation: Firefighter  Tobacco Use   Smoking status: Light Smoker    Types: Cigarettes   Smokeless tobacco: Never   Tobacco comments:    social smoker when having a beer.   Vaping Use   Vaping Use: Never used  Substance and Sexual Activity   Alcohol use: Yes    Comment: social - at most 5 beers per month (smoked consistently from ages [age])   Drug use: No   Sexual activity: Not on file  Other Topics Concern   Not on file  Social History Narrative   Lives with family.   Right-handed.   3 biological children, 3 stepchildren   Caffeine use: 2 cups per day.   Social Determinants of Health   Financial Resource Strain: Not on file  Food Insecurity: Not on file  Transportation Needs: Not on file  Physical Activity: Not on file  Stress: Not on file  Social Connections: Not on file  Intimate Partner Violence: Not on file     Current Outpatient Medications:    amLODipine (NORVASC) 5 MG tablet, Take 2 tablets (10 mg total) by mouth daily., Disp: 30 tablet, Rfl: 0   aspirin 81 MG chewable tablet, Chew 1 tablet (81 mg total) by mouth 2 (two) times daily., Disp: 60 tablet, Rfl: 0   atorvastatin (LIPITOR) 20 MG tablet, Take 1 tablet (20 mg total) by mouth daily., Disp: 90 tablet, Rfl: 0   isosorbide mononitrate (IMDUR) 120 MG 24 hr tablet, Take 1 tablet (120 mg total) by mouth daily., Disp: 90 tablet, Rfl: 3   levETIRAcetam (KEPPRA) 500 MG tablet, Take 1 tablet (500 mg total) by mouth 2 (two) times daily., Disp: 180 tablet, Rfl: 3   hydrALAZINE (APRESOLINE) 25 MG tablet, Take 1 tablet (25 mg total) by mouth in the morning  and at bedtime. (Patient not taking: Reported on 03/19/2022), Disp: 180 tablet, Rfl: 1   lisinopril (ZESTRIL) 40 MG tablet, Take 1 tablet (40 mg total) by mouth daily., Disp: 90 tablet, Rfl: 2   No Known Allergies  ROS Review of Systems  Constitutional: Negative.   HENT: Negative.    Eyes: Negative.   Respiratory: Negative.    Cardiovascular: Negative.   Gastrointestinal: Negative.   Endocrine: Negative.   Genitourinary: Negative.   Musculoskeletal:  Positive for back pain.  Skin: Negative.   Allergic/Immunologic: Negative.   Neurological: Negative.   Hematological: Negative.   Psychiatric/Behavioral: Negative.    All other systems reviewed and are negative.     Objective:    Physical Exam Constitutional:      Appearance: He is obese. He is not ill-appearing.  HENT:     Head: Normocephalic.     Mouth/Throat:     Mouth: Mucous membranes are moist.     Comments: Oropharynx  is crowded Eyes:     Pupils: Pupils are equal, round, and reactive to light.  Cardiovascular:     Heart sounds: No murmur heard. Pulmonary:     Breath sounds: No rhonchi.  Abdominal:     General: Bowel sounds are normal.     Palpations: Abdomen is soft.  Musculoskeletal:        General: No swelling or tenderness.  Skin:    General: Skin is warm.  Neurological:     Mental Status: He is alert and oriented to person, place, and time.  Psychiatric:        Mood and Affect: Mood normal.     BP (!) 170/102   Pulse 84   Temp 97.8 F (36.6 C) (Temporal)   Resp 17   Ht 5\' 8"  (1.727 m)   Wt 270 lb 8 oz (122.7 kg)   SpO2 96%   BMI 41.13 kg/m  Wt Readings from Last 3 Encounters:  03/19/22 270 lb 8 oz (122.7 kg)  09/03/21 270 lb 8.8 oz (122.7 kg)  07/31/21 273 lb (123.8 kg)     Health Maintenance Due  Topic Date Due   TETANUS/TDAP  Never done   Zoster Vaccines- Shingrix (1 of 2) Never done    There are no preventive care reminders to display for this patient.  No results found for:  "TSH" Lab Results  Component Value Date   WBC 10.9 (H) 07/19/2021   HGB 11.5 (L) 07/19/2021   HCT 37.1 (L) 07/19/2021   MCV 84.9 07/19/2021   PLT 224 07/19/2021   Lab Results  Component Value Date  NA 138 07/19/2021   K 3.5 07/19/2021   CO2 27 07/19/2021   GLUCOSE 151 (H) 07/19/2021   BUN 20 07/19/2021   CREATININE 1.39 (H) 07/19/2021   BILITOT 0.3 05/29/2021   ALKPHOS 83 05/29/2021   AST 13 05/29/2021   ALT 15 05/29/2021   PROT 7.2 05/29/2021   ALBUMIN 4.1 05/29/2021   CALCIUM 8.2 (L) 07/19/2021   ANIONGAP 8 07/19/2021   EGFR 75 05/29/2021   Lab Results  Component Value Date   CHOL 235 (H) 05/31/2020   Lab Results  Component Value Date   HDL 39 (L) 05/31/2020   Lab Results  Component Value Date   LDLCALC 178 (H) 05/31/2020   Lab Results  Component Value Date   TRIG 79 05/31/2020   Lab Results  Component Value Date   CHOLHDL 6.0 (H) 05/31/2020   Lab Results  Component Value Date   HGBA1C 5.8 (H) 04/29/2020      Assessment & Plan:   Problem List Items Addressed This Visit       Cardiovascular and Mediastinum   Essential hypertension     Patient denies any chest pain or shortness of breath there is no history of palpitation or paroxysmal nocturnal dyspnea   patient was advised to follow low-salt low-cholesterol diet    ideally I want to keep systolic blood pressure below 130 mmHg, patient was asked to check blood pressure one times a week and give me a report on that.  Patient will be follow-up in 3 months  or earlier as needed, patient will call me back for any change in the cardiovascular symptoms Patient was advised to buy a book from local bookstore concerning blood pressure and read several chapters  every day.  This will be supplemented by some of the material we will give him from the office.  Patient should also utilize other resources like YouTube and Internet to learn more about the blood pressure and the diet.        Nervous and Auditory    Lumbar radiculopathy    Refer to Ortho      Chronic bilateral low back pain with bilateral sciatica    Refer to orthopedics. Patient was advised to lose weight. Stop ibuprofen because it is causing the blood pressure problem        Other   Tobacco abuse    - I instructed the patient to stop smoking and provided them with smoking cessation materials.  - I informed the patient that smoking puts them at increased risk for cancer, COPD, hypertension, and more.  - Informed the patient to seek help if they begin to have trouble breathing, develop chest pain, start to cough up blood, feel faint, or pass out.      Seizure [facility])    Patient was advised to continue antiseizure medication      Morbid obesity ([place])    - I encouraged the patient to lose weight.  - I educated them on making healthy dietary choices including eating more fruits and vegetables and less fried foods. - I encouraged the patient to exercise more, and educated on the benefits of exercise including weight loss, diabetes prevention, and hypertension prevention.   Dietary counseling with a registered dietician  Referral to a weight management support group (e.g. Weight Watchers, Overeaters Anonymous)  If your BMI is greater than 29 or you have gained more than 15 pounds you should work on weight loss.  Attend a healthy cooking class  Hyperlipidemia    Hypercholesterolemia  I advised the patient to follow Mediterranean diet This diet is rich in fruits vegetables and whole grain, and This diet is also rich in fish and lean meat Patient should also eat a handful of almonds or walnuts daily Recent heart study indicated that average follow-up on this kind of diet reduces the cardiovascular mortality by 50 to 70%== patient was advised to lose weight.      Need for tetanus booster    Patient can get a tetanus shot, he works in the [employer] and is exposed to injury.      Relevant Orders   Tdap vaccine  greater than or equal to 7yo IM   Need for shingles vaccine - Primary    Refer to local pharmacist that shingles shot      Relevant Orders   Varicella-zoster vaccine IM   We will refer the patient to pulmonologist to evaluate for sleep apnea.  I proceeded today.    Blood pressure No orders of the defined types were placed in this encounter.   Follow-up: No follow-ups on file.    Cletis Athens, MD

## 2022-03-19 NOTE — Assessment & Plan Note (Signed)

## 2022-03-19 NOTE — Assessment & Plan Note (Signed)

## 2022-03-19 NOTE — Assessment & Plan Note (Signed)
Refer to Ortho?

## 2022-03-19 NOTE — Assessment & Plan Note (Signed)
Refer to orthopedics. Patient was advised to lose weight. Stop ibuprofen because it is causing the blood pressure problem

## 2022-03-19 NOTE — Assessment & Plan Note (Signed)
Hypercholesterolemia  I advised the patient to follow Mediterranean diet This diet is rich in fruits vegetables and whole grain, and This diet is also rich in fish and lean meat Patient should also eat a handful of almonds or walnuts daily Recent heart study indicated that average follow-up on this kind of diet reduces the cardiovascular mortality by 50 to 70%== patient was advised to lose weight.

## 2022-03-19 NOTE — Assessment & Plan Note (Signed)
Refer to local pharmacist that shingles shot

## 2022-03-19 NOTE — Assessment & Plan Note (Signed)
-   I instructed the patient to stop smoking and provided them with smoking cessation materials.  - I informed the patient that smoking puts them at increased risk for cancer, COPD, hypertension, and more.  - Informed the patient to seek help if they begin to have trouble breathing, develop chest pain, start to cough up blood, feel faint, or pass out.  

## 2022-03-20 LAB — CBC WITH DIFFERENTIAL/PLATELET
Absolute Monocytes: 752 cells/uL (ref 200–950)
Basophils Absolute: 40 cells/uL (ref 0–200)
Basophils Relative: 0.6 %
Eosinophils Absolute: 73 cells/uL (ref 15–500)
Eosinophils Relative: 1.1 %
HCT: 41.9 % (ref 38.5–50.0)
Hemoglobin: 13.6 g/dL (ref 13.2–17.1)
Lymphs Abs: 1617 cells/uL (ref 850–3900)
MCH: 26.7 pg — ABNORMAL LOW (ref 27.0–33.0)
MCHC: 32.5 g/dL (ref 32.0–36.0)
MCV: 82.2 fL (ref 80.0–100.0)
MPV: 11.2 fL (ref 7.5–12.5)
Monocytes Relative: 11.4 %
Neutro Abs: 4118 cells/uL (ref 1500–7800)
Neutrophils Relative %: 62.4 %
Platelets: 265 10*3/uL (ref 140–400)
RBC: 5.1 10*6/uL (ref 4.20–5.80)
RDW: 13.5 % (ref 11.0–15.0)
Total Lymphocyte: 24.5 %
WBC: 6.6 10*3/uL (ref 3.8–10.8)

## 2022-03-20 LAB — COMPREHENSIVE METABOLIC PANEL
AG Ratio: 1.3 (calc) (ref 1.0–2.5)
ALT: 14 U/L (ref 9–46)
AST: 14 U/L (ref 10–35)
Albumin: 3.9 g/dL (ref 3.6–5.1)
Alkaline phosphatase (APISO): 59 U/L (ref 35–144)
BUN/Creatinine Ratio: 15 (calc) (ref 6–22)
BUN: 21 mg/dL (ref 7–25)
CO2: 28 mmol/L (ref 20–32)
Calcium: 9.2 mg/dL (ref 8.6–10.3)
Chloride: 104 mmol/L (ref 98–110)
Creat: 1.38 mg/dL — ABNORMAL HIGH (ref 0.70–1.30)
Globulin: 3.1 g/dL (calc) (ref 1.9–3.7)
Glucose, Bld: 83 mg/dL (ref 65–99)
Potassium: 3.7 mmol/L (ref 3.5–5.3)
Sodium: 144 mmol/L (ref 135–146)
Total Bilirubin: 0.4 mg/dL (ref 0.2–1.2)
Total Protein: 7 g/dL (ref 6.1–8.1)

## 2022-03-20 LAB — LIPID PANEL
Cholesterol: 243 mg/dL — ABNORMAL HIGH (ref <200)
HDL: 44 mg/dL (ref >=40)
LDL Cholesterol (Calc): 175 mg/dL (calc) — ABNORMAL HIGH
Non-HDL Cholesterol (Calc): 199 mg/dL (calc) — ABNORMAL HIGH (ref <130)
Total CHOL/HDL Ratio: 5.5 (calc) — ABNORMAL HIGH (ref <5.0)
Triglycerides: 112 mg/dL (ref <150)

## 2022-03-20 LAB — TSH: TSH: 1.77 mIU/L (ref 0.40–4.50)

## 2022-03-20 LAB — PSA: PSA: 0.69 ng/mL (ref <=4.00)

## 2022-03-26 ENCOUNTER — Ambulatory Visit: Payer: Commercial Managed Care - PPO | Admitting: Internal Medicine

## 2022-07-03 ENCOUNTER — Ambulatory Visit (INDEPENDENT_AMBULATORY_CARE_PROVIDER_SITE_OTHER): Payer: Commercial Managed Care - PPO | Admitting: Internal Medicine

## 2022-07-03 ENCOUNTER — Encounter: Payer: Self-pay | Admitting: Internal Medicine

## 2022-07-03 DIAGNOSIS — G4733 Obstructive sleep apnea (adult) (pediatric): Secondary | ICD-10-CM

## 2022-07-03 DIAGNOSIS — R569 Unspecified convulsions: Secondary | ICD-10-CM | POA: Diagnosis not present

## 2022-07-03 DIAGNOSIS — E785 Hyperlipidemia, unspecified: Secondary | ICD-10-CM | POA: Diagnosis not present

## 2022-07-03 DIAGNOSIS — I10 Essential (primary) hypertension: Secondary | ICD-10-CM

## 2022-07-03 MED ORDER — CHLORTHALIDONE 25 MG PO TABS: 25.0000 mg | ORAL_TABLET | Freq: Every day | ORAL | 2 refills | Status: DC

## 2022-07-03 NOTE — Progress Notes (Signed)
Established Patient Office Visit  Subjective:  Patient ID: Jake Kirby, male    DOB: [birth year]  Age: 56 y.o. MRN: [id number]  CC:  Chief Complaint  Patient presents with   losing voice    Since June, no sore throat, thought it was due to weather change but it never went back. BP elevated, no meds today yet     HPI  LOUIS IVERY presents for check up/ pt may have sleep apnoea  Past Medical History:  Diagnosis Date   Arthritis    Depression    Headache    Hypertension    uncontrolled   Seizure ([place])    last seizure was beginning of 2022   Sleep apnea    has not f/u about getting cpap   Stroke [facility]) 04/2020   has affected memory and left sided weakness/will occ drag left foot    Past Surgical History:  Procedure Laterality Date   BACK SURGERY     lumbar fusion   COLONOSCOPY WITH PROPOFOL N/A 02/12/2019   Procedure: COLONOSCOPY WITH PROPOFOL;  Surgeon: Virgel Manifold, MD;  Location: [facility] ENDOSCOPY;  Service: Endoscopy;  Laterality: N/A;   COLONOSCOPY WITH PROPOFOL N/A 09/03/2021   Procedure: COLONOSCOPY WITH PROPOFOL;  Surgeon: Jonathon Bellows, MD;  Location: [facility] ENDOSCOPY;  Service: Gastroenterology;  Laterality: N/A;   COLONOSCOPY WITH PROPOFOL N/A 09/13/2021   Procedure: COLONOSCOPY WITH PROPOFOL;  Surgeon: Jonathon Bellows, MD;  Location: [facility] ENDOSCOPY;  Service: Gastroenterology;  Laterality: N/A;   COLONOSCOPY WITH PROPOFOL N/A 09/18/2021   Procedure: COLONOSCOPY WITH PROPOFOL;  Surgeon: Jonathon Bellows, MD;  Location: [facility] ENDOSCOPY;  Service: Gastroenterology;  Laterality: N/A;   JOINT REPLACEMENT     TOTAL HIP ARTHROPLASTY Right 07/18/2021   Procedure: TOTAL HIP ARTHROPLASTY ANTERIOR APPROACH;  Surgeon: Lovell Sheehan, MD;  Location: [facility] ORS;  Service: Orthopedics;  Laterality: Right;    Family History  Problem Relation Age of Onset   Hypertension Mother    Diabetes Mother    Heart attack Father 34    Social History   Socioeconomic History   Marital  status: Married    Spouse name: Not on file   Number of children: 3   Years of education: some college   Highest education level: Not on file  Occupational History   Occupation: Firefighter  Tobacco Use   Smoking status: Light Smoker    Types: Cigarettes   Smokeless tobacco: Never   Tobacco comments:    social smoker when having a beer.   Vaping Use   Vaping Use: Never used  Substance and Sexual Activity   Alcohol use: Yes    Comment: social - at most 5 beers per month (smoked consistently from ages [age])   Drug use: No   Sexual activity: Not on file  Other Topics Concern   Not on file  Social History Narrative   Lives with family.   Right-handed.   3 biological children, 3 stepchildren   Caffeine use: 2 cups per day.   Social Determinants of Health   Financial Resource Strain: Not on file  Food Insecurity: Not on file  Transportation Needs: Not on file  Physical Activity: Not on file  Stress: Not on file  Social Connections: Not on file  Intimate Partner Violence: Not on file     Current Outpatient Medications:    amLODipine (NORVASC) 5 MG tablet, Take 2 tablets (10 mg total) by mouth daily., Disp: 30 tablet, Rfl: 0   aspirin  81 MG chewable tablet, Chew 1 tablet (81 mg total) by mouth 2 (two) times daily., Disp: 60 tablet, Rfl: 0   atorvastatin (LIPITOR) 20 MG tablet, Take 1 tablet (20 mg total) by mouth daily., Disp: 90 tablet, Rfl: 0   hydrALAZINE (APRESOLINE) 25 MG tablet, Take 1 tablet (25 mg total) by mouth in the morning and at bedtime., Disp: 180 tablet, Rfl: 1   isosorbide mononitrate (IMDUR) 120 MG 24 hr tablet, Take 1 tablet (120 mg total) by mouth daily., Disp: 90 tablet, Rfl: 3   levETIRAcetam (KEPPRA) 500 MG tablet, Take 1 tablet (500 mg total) by mouth 2 (two) times daily., Disp: 180 tablet, Rfl: 3   lisinopril (ZESTRIL) 40 MG tablet, Take 1 tablet (40 mg total) by mouth daily., Disp: 90 tablet, Rfl: 2   No Known Allergies  ROS Review of Systems   Constitutional: Negative.   HENT: Negative.    Eyes: Negative.   Respiratory: Negative.    Cardiovascular: Negative.   Gastrointestinal: Negative.   Endocrine: Negative.   Genitourinary: Negative.   Musculoskeletal: Negative.   Skin: Negative.   Allergic/Immunologic: Negative.   Neurological: Negative.   Hematological: Negative.   Psychiatric/Behavioral: Negative.    All other systems reviewed and are negative.     Objective:    Physical Exam Vitals reviewed.  Constitutional:      Appearance: Normal appearance. He is obese.  HENT:     Mouth/Throat:     Mouth: Mucous membranes are moist.  Eyes:     Pupils: Pupils are equal, round, and reactive to light.  Neck:     Vascular: No carotid bruit.  Cardiovascular:     Rate and Rhythm: Normal rate and regular rhythm.     Pulses: Normal pulses.     Heart sounds: Normal heart sounds.  Pulmonary:     Effort: Pulmonary effort is normal.     Breath sounds: Normal breath sounds.  Abdominal:     General: Bowel sounds are normal.     Palpations: Abdomen is soft. There is no hepatomegaly, splenomegaly or mass.     Tenderness: There is no abdominal tenderness.     Hernia: No hernia is present.  Musculoskeletal:     Cervical back: Neck supple.     Right lower leg: No edema.     Left lower leg: No edema.  Skin:    Findings: No rash.  Neurological:     Mental Status: He is alert and oriented to person, place, and time.     Motor: No weakness.  Psychiatric:        Mood and Affect: Mood normal.        Behavior: Behavior normal.     BP (!) 178/112   Pulse 69   Ht _0  (1.727 m)   Wt 272 lb 1.6 oz (123.4 kg)   SpO2 95%   BMI 41.37 kg/m  Wt Readings from Last 3 Encounters:  07/03/22 272 lb 1.6 oz (123.4 kg)  03/19/22 270 lb 8 oz (122.7 kg)  09/03/21 270 lb 8.8 oz (122.7 kg)     Health Maintenance Due  Topic Date Due   DTaP/Tdap/Td (1 - Tdap) Never done   Zoster Vaccines- Shingrix (1 of 2) Never done   COVID-19  Vaccine (3 - 2023-24 season) 03/22/2022    There are no preventive care reminders to display for this patient.  Lab Results  Component Value Date   TSH 1.77 03/19/2022   Lab Results  Component Value  Date   WBC 6.6 03/19/2022   HGB 13.6 03/19/2022   HCT 41.9 03/19/2022   MCV 82.2 03/19/2022   PLT 265 03/19/2022   Lab Results  Component Value Date   NA 144 03/19/2022   K 3.7 03/19/2022   CO2 28 03/19/2022   GLUCOSE 83 03/19/2022   BUN 21 03/19/2022   CREATININE 1.38 (H) 03/19/2022   BILITOT 0.4 03/19/2022   ALKPHOS 83 05/29/2021   AST 14 03/19/2022   ALT 14 03/19/2022   PROT 7.0 03/19/2022   ALBUMIN 4.1 05/29/2021   CALCIUM 9.2 03/19/2022   ANIONGAP 8 07/19/2021   EGFR 75 05/29/2021   Lab Results  Component Value Date   CHOL 243 (H) 03/19/2022   Lab Results  Component Value Date   HDL 44 03/19/2022   Lab Results  Component Value Date   LDLCALC 175 (H) 03/19/2022   Lab Results  Component Value Date   TRIG 112 03/19/2022   Lab Results  Component Value Date   CHOLHDL 5.5 (H) 03/19/2022   Lab Results  Component Value Date   HGBA1C 5.8 (H) 04/29/2020      Assessment & Plan:   Problem List Items Addressed This Visit       Cardiovascular and Mediastinum   Essential hypertension    I will add chlorthalidone to the new drug regimen        Respiratory   Sleep apnea, obstructive    Refer to ENT        Other   Seizure ([place])    Stable      Morbid obesity ([place]) - Primary    - I encouraged the patient to lose weight.  - I educated them on making healthy dietary choices including eating more fruits and vegetables and less fried foods. - I encouraged the patient to exercise more, and educated on the benefits of exercise including weight loss, diabetes prevention, and hypertension prevention.   Dietary counseling with a registered dietician  Referral to a weight management support group (e.g. Weight Watchers, Overeaters Anonymous)  If your BMI is  greater than 29 or you have gained more than 15 pounds you should work on weight loss.  Attend a healthy cooking class      Hyperlipidemia  Hypercholesterolemia  I advised the patient to follow Mediterranean diet This diet is rich in fruits vegetables and whole grain, and This diet is also rich in fish and lean meat Patient should also eat a handful of almonds or walnuts daily Recent heart study indicated that average follow-up on this kind of diet reduces the cardiovascular mortality by 50 to 70%==   No orders of the defined types were placed in this encounter.   Follow-up: No follow-ups on file.    Cletis Athens, MD

## 2022-07-03 NOTE — Assessment & Plan Note (Signed)

## 2022-07-03 NOTE — Assessment & Plan Note (Signed)
Stable

## 2022-07-03 NOTE — Assessment & Plan Note (Signed)
Refer to ENT

## 2022-07-03 NOTE — Assessment & Plan Note (Signed)
I will add chlorthalidone to the new drug regimen

## 2022-07-31 ENCOUNTER — Ambulatory Visit: Payer: Commercial Managed Care - PPO | Admitting: Family Medicine

## 2022-07-31 ENCOUNTER — Encounter: Payer: Self-pay | Admitting: Family Medicine

## 2022-07-31 VITALS — BP 191/82 | HR 78 | Ht 68.0 in | Wt 271.5 lb

## 2022-07-31 DIAGNOSIS — G40909 Epilepsy, unspecified, not intractable, without status epilepticus: Secondary | ICD-10-CM

## 2022-07-31 MED ORDER — LEVETIRACETAM 500 MG PO TABS: 500.0000 mg | ORAL_TABLET | Freq: Two times a day (BID) | ORAL | 3 refills | Status: DC

## 2022-07-31 NOTE — Patient Instructions (Signed)
Below is our plan:  We will continue levetiracetam '500mg'$  twice daily. Please keep a close eye on your blood pressure. It was elevated again in the office. Please call PCP for readings consistently   Please make sure you are consistent with timing of seizure medication. I recommend annual visit with primary care provider (PCP) for complete physical and routine blood work. I recommend daily intake of vitamin D (400-800iu) and calcium (800-'1000mg'$ ) for bone health. Discuss Dexa screening with PCP.   According to [place] law, you can not drive unless you are seizure / syncope free for at least 6 months and under physician's care.  Please maintain precautions. Do not participate in activities where a loss of awareness could harm you or someone else. No swimming alone, no tub bathing, no hot tubs, no driving, no operating motorized vehicles (cars, ATVs, motocycles, etc), lawnmowers, power tools or firearms. No standing at heights, such as rooftops, ladders or stairs. Avoid hot objects such as stoves, heaters, open fires. Wear a helmet when riding a bicycle, scooter, skateboard, etc. and avoid areas of traffic. Set your water heater to 120 degrees or less.  Please make sure you are staying well hydrated. I recommend 50-60 ounces daily. Well balanced diet and regular exercise encouraged. Consistent sleep schedule with 6-8 hours recommended.   Please continue follow up with care team as directed.   Follow up with me in 1 year   You may receive a survey regarding today's visit. I encourage you to leave honest feed back as I do use this information to improve patient care. Thank you for seeing me today!

## 2022-07-31 NOTE — Progress Notes (Signed)
Chief Complaint  Patient presents with   Follow-up    Patient in room #1 and alone.  Patient here today for f/u on his seizures.     HISTORY OF PRESENT ILLNESS:  07/31/22 ALL:  Jake Kirby returns for follow up for seizures. He continues levetiracetam '500mg'$  BID. He is tolerating med well. No seizures since last being seen. He is taking meds consistently. He does report feeling "off" if he misses med. BP is usually lower at home. He reports systolic readings of around 155. Not usually lower than 140. He reports taking all HTN agents at bedtime. We have discussed hydralazine dosing is BID. He is asymptomatic, today.   07/30/2021 ALL: Jake Kirby is a 57 y.o. male here today for follow up for seizures. He was restarted on levetiracetam '500mg'$  BID at consult 07/2020. When taking consistently he feels well. He tried to wean medication in 09/2020 but felt that he started to notice more generalized shaking and more zoning out. No definite seizure activity. BP is elevated. Home readings are 130-140. He has not taken his BP meds, today. He is s/p right total hip replacement 12/28. He is recovering well but feels pain is causing his BP to be elevated, today.   HISTORY (copied from Dr Gladstone Lighter previous note)  57 year old male here for evaluation of seizures.  History of hypertension, tobacco abuse, obstructive sleep apnea.  On 10/7/1 patient was at home and not feeling well, had multiple generalized seizures witnessed by family.  Patient brought to the hospital by ambulance.  He was transferred from [place] [facility] to [facility] for long-term EEG monitoring.  Multiple EEGs were performed which showed no epileptiform discharges.  Patient was felt to have combination of epileptic and nonepileptic spells.  He was discharged on levetiracetam 5 mg twice a day.  Patient completed this dosing through the end of November and then ran out of medication.  Since that time has had intermittent episodes of  staring spells, jittery shaking sensation, amnesia.  Episodes can last 1 or 2 minutes at a time.  Afterwards he feels tired.   REVIEW OF SYSTEMS: Out of a complete 14 system review of symptoms, the patient complains only of the following symptoms, right hip pain, and all other reviewed systems are negative.   ALLERGIES: No Known Allergies   HOME MEDICATIONS: Outpatient Medications Prior to Visit  Medication Sig Dispense Refill   amLODipine (NORVASC) 5 MG tablet Take 2 tablets (10 mg total) by mouth daily. 30 tablet 0   aspirin 81 MG chewable tablet Chew 1 tablet (81 mg total) by mouth 2 (two) times daily. 60 tablet 0   atorvastatin (LIPITOR) 20 MG tablet Take 1 tablet (20 mg total) by mouth daily. 90 tablet 0   chlorthalidone (HYGROTON) 25 MG tablet Take 1 tablet (25 mg total) by mouth daily. 90 tablet 2   hydrALAZINE (APRESOLINE) 25 MG tablet Take 1 tablet (25 mg total) by mouth in the morning and at bedtime. 180 tablet 1   isosorbide mononitrate (IMDUR) 120 MG 24 hr tablet Take 1 tablet (120 mg total) by mouth daily. 90 tablet 3   levETIRAcetam (KEPPRA) 500 MG tablet Take 1 tablet (500 mg total) by mouth 2 (two) times daily. 180 tablet 3   lisinopril (ZESTRIL) 40 MG tablet Take 1 tablet (40 mg total) by mouth daily. 90 tablet 2   No facility-administered medications prior to visit.     PAST MEDICAL HISTORY: Past Medical History:  Diagnosis Date  Arthritis    Depression    Headache    Hypertension    uncontrolled   Seizure ([place])    last seizure was beginning of 2022   Sleep apnea    has not f/u about getting cpap   Stroke [facility]) 04/2020   has affected memory and left sided weakness/will occ drag left foot     PAST SURGICAL HISTORY: Past Surgical History:  Procedure Laterality Date   BACK SURGERY     lumbar fusion   COLONOSCOPY WITH PROPOFOL N/A 02/12/2019   Procedure: COLONOSCOPY WITH PROPOFOL;  Surgeon: Virgel Manifold, MD;  Location: [facility] ENDOSCOPY;  Service:  Endoscopy;  Laterality: N/A;   COLONOSCOPY WITH PROPOFOL N/A 09/03/2021   Procedure: COLONOSCOPY WITH PROPOFOL;  Surgeon: Jonathon Bellows, MD;  Location: [facility] ENDOSCOPY;  Service: Gastroenterology;  Laterality: N/A;   COLONOSCOPY WITH PROPOFOL N/A 09/13/2021   Procedure: COLONOSCOPY WITH PROPOFOL;  Surgeon: Jonathon Bellows, MD;  Location: [facility] ENDOSCOPY;  Service: Gastroenterology;  Laterality: N/A;   COLONOSCOPY WITH PROPOFOL N/A 09/18/2021   Procedure: COLONOSCOPY WITH PROPOFOL;  Surgeon: Jonathon Bellows, MD;  Location: [facility] ENDOSCOPY;  Service: Gastroenterology;  Laterality: N/A;   JOINT REPLACEMENT     TOTAL HIP ARTHROPLASTY Right 07/18/2021   Procedure: TOTAL HIP ARTHROPLASTY ANTERIOR APPROACH;  Surgeon: Lovell Sheehan, MD;  Location: [facility] ORS;  Service: Orthopedics;  Laterality: Right;     FAMILY HISTORY: Family History  Problem Relation Age of Onset   Hypertension Mother    Diabetes Mother    Heart attack Father 54     SOCIAL HISTORY: Social History   Socioeconomic History   Marital status: Married    Spouse name: Not on file   Number of children: 3   Years of education: some college   Highest education level: Not on file  Occupational History   Occupation: Firefighter  Tobacco Use   Smoking status: Light Smoker    Types: Cigarettes   Smokeless tobacco: Never   Tobacco comments:    social smoker when having a beer.   Vaping Use   Vaping Use: Never used  Substance and Sexual Activity   Alcohol use: Yes    Comment: social - at most 5 beers per month (smoked consistently from ages [age])   Drug use: No   Sexual activity: Not on file  Other Topics Concern   Not on file  Social History Narrative   Lives with family.   Right-handed.   3 biological children, 3 stepchildren   Caffeine use: 2 cups per day.   Social Determinants of Health   Financial Resource Strain: Not on file  Food Insecurity: Not on file  Transportation Needs: Not on file  Physical Activity: Not on file   Stress: Not on file  Social Connections: Not on file  Intimate Partner Violence: Not on file     PHYSICAL EXAM  Vitals:   07/31/22 0857  BP: (!) 191/82  Pulse: 78  Weight: 271 lb 8 oz (123.2 kg)  Height: '5\' 8"'$  (1.727 m)    Body mass index is 41.28 kg/m.  Generalized: Well developed, in no acute distress  Cardiology: normal rate and rhythm, no murmur auscultated  Respiratory: clear to auscultation bilaterally    Neurological examination  Mentation: Alert oriented to time, place, history taking. Follows all commands speech and language fluent Cranial nerve II-XII: Pupils were equal round reactive to light. Extraocular movements were full, visual field were full on confrontational test. Facial sensation and strength were  normal. Uvula tongue midline. Head turning and shoulder shrug  were normal and symmetric. Motor: The motor testing reveals 5 over 5 strength of all 4 extremities. Good symmetric motor tone is noted throughout.  Gait and station: gait normal    DIAGNOSTIC DATA (LABS, IMAGING, TESTING) - I reviewed patient records, labs, notes, testing and imaging myself where available.  Lab Results  Component Value Date   WBC 6.6 03/19/2022   HGB 13.6 03/19/2022   HCT 41.9 03/19/2022   MCV 82.2 03/19/2022   PLT 265 03/19/2022      Component Value Date/Time   NA 144 03/19/2022 1243   NA 143 05/29/2021 0853   K 3.7 03/19/2022 1243   CL 104 03/19/2022 1243   CO2 28 03/19/2022 1243   GLUCOSE 83 03/19/2022 1243   BUN 21 03/19/2022 1243   BUN 13 05/29/2021 0853   CREATININE 1.38 (H) 03/19/2022 1243   CALCIUM 9.2 03/19/2022 1243   PROT 7.0 03/19/2022 1243   PROT 7.2 05/29/2021 0853   ALBUMIN 4.1 05/29/2021 0853   AST 14 03/19/2022 1243   ALT 14 03/19/2022 1243   ALKPHOS 83 05/29/2021 0853   BILITOT 0.4 03/19/2022 1243   BILITOT 0.3 05/29/2021 0853   GFRNONAA >60 07/19/2021 0531   GFRAA 82 06/20/2020 1030   Lab Results  Component Value Date   CHOL 243 (H)  03/19/2022   HDL 44 03/19/2022   LDLCALC 175 (H) 03/19/2022   TRIG 112 03/19/2022   CHOLHDL 5.5 (H) 03/19/2022   Lab Results  Component Value Date   HGBA1C 5.8 (H) 04/29/2020   No results found for: "VITAMINB12" Lab Results  Component Value Date   TSH 1.77 03/19/2022        No data to display               No data to display           ASSESSMENT AND PLAN  57 y.o. year old male  has a past medical history of Arthritis, Depression, Headache, Hypertension, Seizure ([place]), Sleep apnea, and Stroke ([place]) (04/2020). here with    Seizure disorder ([place])  Zalan is tolerating levetiracetam well. No seizure activity. We will continue levetiracetam '500mg'$  BID. He was advised to take medication consistently. BP is elevated. He is asymptomatic. He has not taken BP meds this morning. I have reviewed his medication list and dosing instructions. He was advised to take as soon as he gets home. Home readings usually 035-009 systolic. He will call PCP for continued concerns of elevation. He has follow up with orthopedics, tomorrow. Healthy lifestyle habits encouraged. He will follow up with me in 1 year, sooner if needed.    No orders of the defined types were placed in this encounter.    Meds ordered this encounter  Medications   levETIRAcetam (KEPPRA) 500 MG tablet    Sig: Take 1 tablet (500 mg total) by mouth 2 (two) times daily.    Dispense:  180 tablet    Refill:  3    Order Specific Question:   Supervising Provider    Answer:   Melvenia Beam [[id number]]      Debbora Presto, MSN, FNP-C 07/31/2022, 9:34 AM  [facility] [address] [place], [place] [address] [phone]

## 2022-09-20 DIAGNOSIS — J382 Nodules of vocal cords: Secondary | ICD-10-CM

## 2022-09-20 HISTORY — DX: Nodules of vocal cords: J38.2

## 2022-10-15 ENCOUNTER — Encounter
Admission: RE | Admit: 2022-10-15 | Discharge: 2022-10-15 | Disposition: A | Discharge status: Home / Self Care | Payer: Commercial Managed Care - PPO | Source: Ambulatory Visit | Attending: Otolaryngology | Admitting: Otolaryngology

## 2022-10-15 ENCOUNTER — Encounter: Payer: Self-pay | Admitting: Otolaryngology

## 2022-10-15 VITALS — Ht 68.0 in | Wt 269.0 lb

## 2022-10-15 DIAGNOSIS — I10 Essential (primary) hypertension: Secondary | ICD-10-CM

## 2022-10-15 DIAGNOSIS — Z01812 Encounter for preprocedural laboratory examination: Secondary | ICD-10-CM | POA: Insufficient documentation

## 2022-10-15 DIAGNOSIS — Z01810 Encounter for preprocedural cardiovascular examination: Secondary | ICD-10-CM | POA: Diagnosis not present

## 2022-10-15 HISTORY — DX: Morbid (severe) obesity due to excess calories: E66.01

## 2022-10-15 HISTORY — DX: Thoracic aortic aneurysm, without rupture, unspecified: I71.20

## 2022-10-15 HISTORY — DX: Radiculopathy, lumbar region: M54.16

## 2022-10-15 HISTORY — DX: Essential (primary) hypertension: I10

## 2022-10-15 HISTORY — DX: Hyperlipidemia, unspecified: E78.5

## 2022-10-15 LAB — CBC
HCT: 42.8 % (ref 39.0–52.0)
Hemoglobin: 13.8 g/dL (ref 13.0–17.0)
MCH: 27.1 pg (ref 26.0–34.0)
MCHC: 32.2 g/dL (ref 30.0–36.0)
MCV: 84.1 fL (ref 80.0–100.0)
Platelets: 223 10*3/uL (ref 150–400)
RBC: 5.09 MIL/uL (ref 4.22–5.81)
RDW: 12.8 % (ref 11.5–15.5)
WBC: 5.7 10*3/uL (ref 4.0–10.5)
nRBC: 0 % (ref 0.0–0.2)

## 2022-10-15 LAB — BASIC METABOLIC PANEL
Anion gap: 12 (ref 5–15)
BUN: 24 mg/dL — ABNORMAL HIGH (ref 6–20)
CO2: 26 mmol/L (ref 22–32)
Calcium: 8.7 mg/dL — ABNORMAL LOW (ref 8.9–10.3)
Chloride: 100 mmol/L (ref 98–111)
Creatinine, Ser: 1.25 mg/dL — ABNORMAL HIGH (ref 0.61–1.24)
GFR, Estimated: >60 mL/min (ref >60)
Glucose, Bld: 93 mg/dL (ref 70–99)
Potassium: 3.3 mmol/L — ABNORMAL LOW (ref 3.5–5.1)
Sodium: 138 mmol/L (ref 135–145)

## 2022-10-15 NOTE — Patient Instructions (Signed)
Your procedure is scheduled on: Wednesday, April 17 Report to the Registration Desk on the 1st floor of the [facility]. To find out your arrival time, please call [phone] between 1PM - 3PM on: Tuesday, April 16 If your arrival time is 6:00 am, do not arrive before that time as the [place] entrance doors do not open until 6:00 am.  REMEMBER: Instructions that are not followed completely may result in serious medical risk, up to and including death; or upon the discretion of your surgeon and anesthesiologist your surgery may need to be rescheduled.  Do not eat food after midnight the night before surgery.  No gum chewing or hard candies.  You may however, drink CLEAR liquids up to 2 hours before you are scheduled to arrive for your surgery. Do not drink anything within 2 hours of your scheduled arrival time.  Clear liquids include: - water  - apple juice without pulp - gatorade (not RED colors) - black coffee or tea (Do NOT add milk or creamers to the coffee or tea) Do NOT drink anything that is not on this list.  One week prior to surgery: starting April 10 Stop Anti-inflammatories (NSAIDS) such as Advil, Aleve, Ibuprofen, Motrin, Naproxen, Naprosyn and Aspirin based products such as Excedrin, Goody's Powder, BC Powder. Stop ANY OVER THE COUNTER supplements until after surgery. You may however, continue to take Tylenol if needed for pain up until the day of surgery.  Continue taking all prescribed medications  TAKE ONLY THESE MEDICATIONS THE MORNING OF SURGERY WITH A SIP OF WATER:  Amlodipine Atorvastatin (Lipitor) Hydralazine Isosorbide mononitrate Levetiracetam (Keppra)  No Alcohol for 24 hours before or after surgery.  No Smoking including e-cigarettes for 24 hours before surgery.  No chewable tobacco products for at least 6 hours before surgery.  No nicotine patches on the day of surgery.  Do not use any "recreational" drugs for at least a week (preferably 2  weeks) before your surgery.  Please be advised that the combination of cocaine and anesthesia may have negative outcomes, up to and including death. If you test positive for cocaine, your surgery will be cancelled.  On the morning of surgery brush your teeth with toothpaste and water, you may rinse your mouth with mouthwash if you wish. Do not swallow any toothpaste or mouthwash.  Do not wear jewelry, make-up, hairpins, clips or nail polish.  Do not wear lotions, powders, or perfumes.   Do not shave body hair from the neck down 48 hours before surgery.  Contact lenses, hearing aids and dentures may not be worn into surgery.  Do not bring valuables to the hospital. [facility] is not responsible for any missing/lost belongings or valuables.   Notify your doctor if there is any change in your medical condition (cold, fever, infection).  Wear comfortable clothing (specific to your surgery type) to the hospital.  If you are being discharged the day of surgery, you will not be allowed to drive home. You will need a responsible individual to drive you home and stay with you for 24 hours after surgery.   If you are taking public transportation, you will need to have a responsible individual with you.  Please call the [place] Dept. at [phone] if you have any questions about these instructions.  Surgery Visitation Policy:  Patients having surgery or a procedure may have two visitors.  Children under the age of 65 must have an adult with them who is not the patient.

## 2022-10-31 ENCOUNTER — Encounter
Admission: RE | Admit: 2022-10-31 | Discharge: 2022-10-31 | Disposition: A | Discharge status: Home / Self Care | Payer: Commercial Managed Care - PPO | Source: Ambulatory Visit | Attending: Otolaryngology | Admitting: Otolaryngology

## 2022-10-31 NOTE — Pre-Procedure Instructions (Signed)
Chart reviewed, PAT follow up before scheduled surgery on 11/06/22, detailed message left on VM. Instructed to call for any questions or concerns.

## 2022-11-04 ENCOUNTER — Encounter: Payer: Self-pay | Admitting: Otolaryngology

## 2022-11-06 ENCOUNTER — Other Ambulatory Visit: Payer: Self-pay

## 2022-11-06 ENCOUNTER — Ambulatory Visit: Payer: Commercial Managed Care - PPO | Admitting: Urgent Care

## 2022-11-06 ENCOUNTER — Ambulatory Visit
Admission: RE | Admit: 2022-11-06 | Discharge: 2022-11-06 | Disposition: A | Discharge status: Home / Self Care | Payer: Commercial Managed Care - PPO | Attending: Otolaryngology | Admitting: Otolaryngology

## 2022-11-06 ENCOUNTER — Encounter
Admission: RE | Disposition: A | Discharge status: Home / Self Care | Payer: Self-pay | Source: Home / Self Care | Attending: Otolaryngology

## 2022-11-06 ENCOUNTER — Encounter: Payer: Self-pay | Admitting: Otolaryngology

## 2022-11-06 ENCOUNTER — Other Ambulatory Visit: Payer: Self-pay | Admitting: Medical

## 2022-11-06 DIAGNOSIS — I10 Essential (primary) hypertension: Secondary | ICD-10-CM | POA: Diagnosis not present

## 2022-11-06 DIAGNOSIS — R079 Chest pain, unspecified: Secondary | ICD-10-CM

## 2022-11-06 DIAGNOSIS — R569 Unspecified convulsions: Secondary | ICD-10-CM | POA: Insufficient documentation

## 2022-11-06 DIAGNOSIS — M1990 Unspecified osteoarthritis, unspecified site: Secondary | ICD-10-CM | POA: Diagnosis not present

## 2022-11-06 DIAGNOSIS — J381 Polyp of vocal cord and larynx: Secondary | ICD-10-CM | POA: Diagnosis not present

## 2022-11-06 DIAGNOSIS — Z6841 Body Mass Index (BMI) 40.0 and over, adult: Secondary | ICD-10-CM | POA: Diagnosis not present

## 2022-11-06 DIAGNOSIS — I69354 Hemiplegia and hemiparesis following cerebral infarction affecting left non-dominant side: Secondary | ICD-10-CM | POA: Insufficient documentation

## 2022-11-06 DIAGNOSIS — Z79899 Other long term (current) drug therapy: Secondary | ICD-10-CM | POA: Diagnosis not present

## 2022-11-06 DIAGNOSIS — J382 Nodules of vocal cords: Secondary | ICD-10-CM | POA: Diagnosis present

## 2022-11-06 DIAGNOSIS — G4730 Sleep apnea, unspecified: Secondary | ICD-10-CM | POA: Diagnosis not present

## 2022-11-06 DIAGNOSIS — Z981 Arthrodesis status: Secondary | ICD-10-CM | POA: Diagnosis not present

## 2022-11-06 HISTORY — DX: Cardiomegaly: I51.7

## 2022-11-06 HISTORY — DX: Chronic pain syndrome: G89.4

## 2022-11-06 HISTORY — PX: MICROLARYNGOSCOPY: SHX5208

## 2022-11-06 HISTORY — DX: Atherosclerosis of aorta: I70.0

## 2022-11-06 SURGERY — MICROLARYNGOSCOPY
Anesthesia: General | Laterality: Right

## 2022-11-06 MED ORDER — OXYCODONE HCL 5 MG PO TABS
5.0000 mg | ORAL_TABLET | Freq: Once | ORAL | Status: AC | PRN
  Administered 2022-11-06: 5 mg via ORAL

## 2022-11-06 MED ORDER — SUCCINYLCHOLINE CHLORIDE 200 MG/10ML IV SOSY
PREFILLED_SYRINGE | INTRAVENOUS | Status: DC | PRN
  Administered 2022-11-06: 120 mg via INTRAVENOUS

## 2022-11-06 MED ORDER — LIDOCAINE HCL (PF) 2 % IJ SOLN
INTRAMUSCULAR | Status: AC
  Filled 2022-11-06: qty 5

## 2022-11-06 MED ORDER — LIDOCAINE HCL (CARDIAC) PF 100 MG/5ML IV SOSY
PREFILLED_SYRINGE | INTRAVENOUS | Status: DC | PRN
  Administered 2022-11-06: 100 mg via INTRAVENOUS

## 2022-11-06 MED ORDER — OXYMETAZOLINE HCL 0.05 % NA SOLN
NASAL | Status: DC | PRN
  Administered 2022-11-06: 1 via TOPICAL

## 2022-11-06 MED ORDER — 0.9 % SODIUM CHLORIDE (POUR BTL) OPTIME
TOPICAL | Status: DC | PRN
  Administered 2022-11-06: 10 mL

## 2022-11-06 MED ORDER — PHENYLEPHRINE HCL (PRESSORS) 10 MG/ML IV SOLN
INTRAVENOUS | Status: DC | PRN
  Administered 2022-11-06: 80 ug via INTRAVENOUS

## 2022-11-06 MED ORDER — ACETAMINOPHEN 10 MG/ML IV SOLN
INTRAVENOUS | Status: DC | PRN
  Administered 2022-11-06: 1000 mg via INTRAVENOUS

## 2022-11-06 MED ORDER — GLYCOPYRROLATE 0.2 MG/ML IJ SOLN
INTRAMUSCULAR | Status: AC
  Filled 2022-11-06: qty 1

## 2022-11-06 MED ORDER — DEXAMETHASONE SODIUM PHOSPHATE 10 MG/ML IJ SOLN
INTRAMUSCULAR | Status: DC | PRN
  Administered 2022-11-06: 10 mg via INTRAVENOUS

## 2022-11-06 MED ORDER — FENTANYL CITRATE (PF) 100 MCG/2ML IJ SOLN
INTRAMUSCULAR | Status: AC
  Filled 2022-11-06: qty 2

## 2022-11-06 MED ORDER — HYDRALAZINE HCL 20 MG/ML IJ SOLN: 10.0000 mg | Freq: Once | INTRAMUSCULAR | Status: DC

## 2022-11-06 MED ORDER — ONDANSETRON HCL 4 MG/2ML IJ SOLN
INTRAMUSCULAR | Status: DC | PRN
  Administered 2022-11-06: 4 mg via INTRAVENOUS

## 2022-11-06 MED ORDER — DEXMEDETOMIDINE HCL IN NACL 80 MCG/20ML IV SOLN
INTRAVENOUS | Status: DC | PRN
  Administered 2022-11-06: 8 ug via BUCCAL

## 2022-11-06 MED ORDER — CHLORHEXIDINE GLUCONATE 0.12 % MT SOLN
OROMUCOSAL | Status: AC
  Filled 2022-11-06: qty 15

## 2022-11-06 MED ORDER — OXYCODONE HCL 5 MG PO TABS
ORAL_TABLET | ORAL | Status: AC
  Filled 2022-11-06: qty 1

## 2022-11-06 MED ORDER — SUCCINYLCHOLINE CHLORIDE 200 MG/10ML IV SOSY
PREFILLED_SYRINGE | INTRAVENOUS | Status: AC
  Filled 2022-11-06: qty 10

## 2022-11-06 MED ORDER — FAMOTIDINE 20 MG PO TABS
ORAL_TABLET | ORAL | Status: AC
  Filled 2022-11-06: qty 1

## 2022-11-06 MED ORDER — SUGAMMADEX SODIUM 200 MG/2ML IV SOLN
INTRAVENOUS | Status: DC | PRN
  Administered 2022-11-06: 200 mg via INTRAVENOUS

## 2022-11-06 MED ORDER — ORAL CARE MOUTH RINSE: 15.0000 mL | Freq: Once | OROMUCOSAL | Status: AC

## 2022-11-06 MED ORDER — ROCURONIUM BROMIDE 10 MG/ML (PF) SYRINGE
PREFILLED_SYRINGE | INTRAVENOUS | Status: AC
  Filled 2022-11-06: qty 10

## 2022-11-06 MED ORDER — FENTANYL CITRATE (PF) 100 MCG/2ML IJ SOLN: 25.0000 ug | INTRAMUSCULAR | Status: DC | PRN

## 2022-11-06 MED ORDER — MIDAZOLAM HCL 2 MG/2ML IJ SOLN
INTRAMUSCULAR | Status: AC
  Filled 2022-11-06: qty 2

## 2022-11-06 MED ORDER — FENTANYL CITRATE (PF) 100 MCG/2ML IJ SOLN
INTRAMUSCULAR | Status: DC | PRN
  Administered 2022-11-06: 50 ug via INTRAVENOUS

## 2022-11-06 MED ORDER — FAMOTIDINE 20 MG PO TABS
20.0000 mg | ORAL_TABLET | Freq: Once | ORAL | Status: AC
  Administered 2022-11-06: 20 mg via ORAL

## 2022-11-06 MED ORDER — ROCURONIUM BROMIDE 100 MG/10ML IV SOLN
INTRAVENOUS | Status: DC | PRN
  Administered 2022-11-06: 30 mg via INTRAVENOUS
  Administered 2022-11-06: 10 mg via INTRAVENOUS

## 2022-11-06 MED ORDER — HYDROCODONE-ACETAMINOPHEN 5-325 MG PO TABS: 1.0000 | ORAL_TABLET | Freq: Four times a day (QID) | ORAL | 0 refills | Status: DC | PRN

## 2022-11-06 MED ORDER — LACTATED RINGERS IV SOLN: INTRAVENOUS | Status: DC

## 2022-11-06 MED ORDER — ACETAMINOPHEN 10 MG/ML IV SOLN
INTRAVENOUS | Status: AC
  Filled 2022-11-06: qty 100

## 2022-11-06 MED ORDER — ONDANSETRON HCL 4 MG/2ML IJ SOLN
INTRAMUSCULAR | Status: AC
  Filled 2022-11-06: qty 2

## 2022-11-06 MED ORDER — MIDAZOLAM HCL 2 MG/2ML IJ SOLN
INTRAMUSCULAR | Status: DC | PRN
  Administered 2022-11-06: 2 mg via INTRAVENOUS

## 2022-11-06 MED ORDER — DEXAMETHASONE SODIUM PHOSPHATE 10 MG/ML IJ SOLN
INTRAMUSCULAR | Status: AC
  Filled 2022-11-06: qty 1

## 2022-11-06 MED ORDER — PROPOFOL 10 MG/ML IV BOLUS
INTRAVENOUS | Status: AC
  Filled 2022-11-06: qty 20

## 2022-11-06 MED ORDER — CHLORHEXIDINE GLUCONATE 0.12 % MT SOLN
15.0000 mL | Freq: Once | OROMUCOSAL | Status: AC
  Administered 2022-11-06: 15 mL via OROMUCOSAL

## 2022-11-06 MED ORDER — HYDRALAZINE HCL 20 MG/ML IJ SOLN
10.0000 mg | Freq: Once | INTRAMUSCULAR | Status: AC
  Administered 2022-11-06: 10 mg via INTRAVENOUS

## 2022-11-06 MED ORDER — HYDRALAZINE HCL 20 MG/ML IJ SOLN
INTRAMUSCULAR | Status: AC
  Filled 2022-11-06: qty 1

## 2022-11-06 MED ORDER — OXYCODONE HCL 5 MG/5ML PO SOLN: 5.0000 mg | Freq: Once | ORAL | Status: AC | PRN

## 2022-11-06 MED ORDER — PROPOFOL 10 MG/ML IV BOLUS
INTRAVENOUS | Status: DC | PRN
  Administered 2022-11-06: 200 mg via INTRAVENOUS

## 2022-11-06 MED ORDER — OXYMETAZOLINE HCL 0.05 % NA SOLN
NASAL | Status: AC
  Filled 2022-11-06: qty 30

## 2022-11-06 MED ORDER — GLYCOPYRROLATE 0.2 MG/ML IJ SOLN
INTRAMUSCULAR | Status: DC | PRN
  Administered 2022-11-06: .1 mg via INTRAVENOUS

## 2022-11-06 MED ORDER — PHENYLEPHRINE 80 MCG/ML (10ML) SYRINGE FOR IV PUSH (FOR BLOOD PRESSURE SUPPORT)
PREFILLED_SYRINGE | INTRAVENOUS | Status: AC
  Filled 2022-11-06: qty 10

## 2022-11-06 SURGICAL SUPPLY — 20 items
BLOCK BITE GUARD (MISCELLANEOUS) ×1 IMPLANT
COVER BACK TABLE REUSABLE LG (DRAPES) ×1 IMPLANT
COVER MAYO STAND STRL (DRAPES) ×1 IMPLANT
CUP MEDICINE 2OZ PLAST GRAD ST (MISCELLANEOUS) ×1 IMPLANT
DRSG TELFA 3X4 N-ADH STERILE (GAUZE/BANDAGES/DRESSINGS) ×1 IMPLANT
GAUZE 4X4 16PLY ~~LOC~~+RFID DBL (SPONGE) ×1 IMPLANT
GLOVE BIO SURGEON STRL SZ7.5 (GLOVE) ×1 IMPLANT
GOWN STRL REUS W/ TWL LRG LVL3 (GOWN DISPOSABLE) ×1 IMPLANT
GOWN STRL REUS W/TWL LRG LVL3 (GOWN DISPOSABLE) ×2
KIT TURNOVER KIT A (KITS) ×1 IMPLANT
MANIFOLD NEPTUNE II (INSTRUMENTS) ×1 IMPLANT
MARKER SKIN DUAL TIP RULER LAB (MISCELLANEOUS) ×1 IMPLANT
NDL HYPO 25GX1X1/2 BEV (NEEDLE) IMPLANT
NDL SAFETY ECLIP 18X1.5 (MISCELLANEOUS) ×1 IMPLANT
NEEDLE HYPO 25GX1X1/2 BEV (NEEDLE) IMPLANT
PATTIES SURGICAL .5 X.5 (GAUZE/BANDAGES/DRESSINGS) ×1 IMPLANT
SYR 3ML LL SCALE MARK (SYRINGE) IMPLANT
TOWEL OR 17X26 4PK STRL BLUE (TOWEL DISPOSABLE) ×1 IMPLANT
TUBING CONNECTING 10 (TUBING) ×1 IMPLANT
WATER STERILE IRR 500ML POUR (IV SOLUTION) ×1 IMPLANT

## 2022-11-06 NOTE — Anesthesia Preprocedure Evaluation (Signed)
Anesthesia Evaluation  Patient identified by MRN, date of birth, ID band Patient awake    Reviewed: Allergy & Precautions, NPO status , Patient's Chart, lab work & pertinent test results  History of Anesthesia Complications Negative for: history of anesthetic complications  Airway Mallampati: IV  TM Distance: >3 FB Neck ROM: Full    Dental no notable dental hx. (+) Teeth Intact, Dental Advidsory Given Missing molars x4:   Pulmonary shortness of breath and with exertion, sleep apnea , neg COPD, Current Smoker and Patient abstained from smoking. Occasional smoker, has not smoked in weeks.   Pulmonary exam normal breath sounds clear to auscultation       Cardiovascular Exercise Tolerance: Poor METS: 3 - Mets hypertension, Pt. on medications (-) CAD and (-) Past MI Normal cardiovascular exam(-) dysrhythmias  Rhythm:Regular Rate:Normal - Systolic murmurs Ascending thoracic aneurysm 4.2 cm  ECG 06/25/21:  SR, 97bpm, LVH, nonspecific T wave changes  Echo 06/13/20 1. Left ventricular ejection fraction, by estimation, is 55 to 60%. The left ventricle has normal function. Left ventricular endocardial border not optimally defined to evaluate regional wall motion. There is moderate  left ventricular hypertrophy. Left ventricular diastolic parameters are consistent with Grade I diastolic  dysfunction (impaired relaxation).  2. Right ventricular systolic function is normal. The right ventricular size is mildly enlarged. Tricuspid regurgitation signal is inadequate for assessing PA pressure.  3. The mitral valve is normal in structure. No evidence of mitral valve regurgitation. No evidence of mitral stenosis.  4. The aortic valve has an indeterminant number of cusps. Aortic valve regurgitation is not visualized. No aortic stenosis is present.  5. Aortic dilatation noted. There is mild dilatation of the aortic root, measuring 40 mm.  6.  Mildly dilated pulmonary artery.   Neuro/Psych Seizures - (last sz 07/2020), Well Controlled,  PSYCHIATRIC DISORDERS  Depression    S/p lumbar fusion.  CVA (04/2020, residual left foot and hand weakness), Residual Symptoms    GI/Hepatic negative GI ROS,neg GERD  ,,(+)     (-) substance abuse    Endo/Other  neg diabetes  Morbid obesityClass 3 obesity  Renal/GU negative Renal ROS     Musculoskeletal  (+) Arthritis ,    Abdominal  (+) + obese  Peds  Hematology negative hematology ROS (+)   Anesthesia Other Findings Past Medical History: No date: Arthritis No date: Depression No date: Headache No date: Hypertension     Comment:  uncontrolled No date: Seizure ([facility])     Comment:  last seizure was beginning of 2022 No date: Sleep apnea     Comment:  has not f/u about getting cpap 04/2020: Stroke [facility])     Comment:  has affected memory and left sided weakness/will occ               drag left foot  Reproductive/Obstetrics                             Anesthesia Physical Anesthesia Plan  ASA: 3  Anesthesia Plan: General   Post-op Pain Management: Minimal or no pain anticipated   Induction: Intravenous  PONV Risk Score and Plan: 1 and Propofol infusion, TIVA, Treatment may vary due to age or medical condition and Ondansetron  Airway Management Planned: Oral ETT  Additional Equipment: None  Intra-op Plan:   Post-operative Plan: Extubation in OR  Informed Consent: I have reviewed the patients History and Physical, chart, labs and discussed the procedure  including the risks, benefits and alternatives for the proposed anesthesia with the patient or authorized representative who has indicated his/her understanding and acceptance.     Dental advisory given  Plan Discussed with: CRNA  Anesthesia Plan Comments: (Patient consented for risks of anesthesia including but not limited to:  - adverse reactions to medications - damage to eyes,  teeth, lips or other oral mucosa - nerve damage due to positioning  - sore throat or hoarseness - Damage to heart, brain, nerves, lungs, other parts of body or loss of life  Patient voiced understanding.)        Anesthesia Quick Evaluation

## 2022-11-06 NOTE — Transfer of Care (Signed)
Immediate Anesthesia Transfer of Care Note  Patient: Jake Kirby  Procedure(s) Performed: MICROLARYNGOSCOPY WITH EXCISION OF VOCAL CORD NODULE (Right)  Patient Location: PACU  Anesthesia Type:General  Level of Consciousness: drowsy  Airway & Oxygen Therapy: Patient Spontanous Breathing and Patient connected to face mask oxygen  Post-op Assessment: Report given to RN  Post vital signs: stable  Last Vitals:  Vitals Value Taken Time  BP 175/102 11/06/22 1215  Temp    Pulse 84 11/06/22 1217  Resp 17 11/06/22 1217  SpO2 99 % 11/06/22 1217  Vitals shown include unvalidated device data.  Last Pain:  Vitals:   11/06/22 1012  TempSrc: Oral         Complications: No notable events documented.

## 2022-11-06 NOTE — Discharge Instructions (Signed)

## 2022-11-06 NOTE — Anesthesia Postprocedure Evaluation (Signed)
Anesthesia Post Note  Patient: Jake Kirby  Procedure(s) Performed: MICROLARYNGOSCOPY WITH EXCISION OF VOCAL CORD NODULE (Right)  Patient location during evaluation: PACU Anesthesia Type: General Level of consciousness: awake and alert Pain management: pain level controlled Vital Signs Assessment: post-procedure vital signs reviewed and stable Respiratory status: spontaneous breathing, nonlabored ventilation, respiratory function stable and patient connected to nasal cannula oxygen Cardiovascular status: blood pressure returned to baseline and stable Postop Assessment: no apparent nausea or vomiting Anesthetic complications: no  No notable events documented.   Last Vitals:  Vitals:   11/06/22 1330 11/06/22 1345  BP: (!) 164/90 (!) 160/90  Pulse:  66  Resp:  15  Temp: 36.6 C (!) 36.2 C  SpO2: 96% 97%    Last Pain:  Vitals:   11/06/22 1345  TempSrc: Temporal  PainSc: 0-No pain                 Stephanie Coup

## 2022-11-06 NOTE — H&P (Signed)
History and physical reviewed and will be scanned in later. No change in medical status reported by the patient or family, appears stable for surgery. All questions regarding the procedure answered, and patient (or family if a child) expressed understanding of the procedure. ? ?Jake Kirby S Mithcell Schumpert ?@TODAY@ ?

## 2022-11-06 NOTE — Anesthesia Procedure Notes (Signed)
Procedure Name: Intubation Date/Time: 11/06/2022 11:21 AM  Performed by: Morene Crocker, CRNAPre-anesthesia Checklist: Patient identified, Patient being monitored, Timeout performed, Emergency Drugs available and Suction available Patient Re-evaluated:Patient Re-evaluated prior to induction Oxygen Delivery Method: Circle system utilized Preoxygenation: Pre-oxygenation with 100% oxygen Induction Type: IV induction Ventilation: Two handed mask ventilation required and Oral airway inserted - appropriate to patient size Laryngoscope Size: 3 and McGraph Grade View: Grade I Tube type: Oral Tube size: 6.0 mm Number of attempts: 1 Airway Equipment and Method: Stylet Placement Confirmation: ETT inserted through vocal cords under direct vision, positive ETCO2 and breath sounds checked- equal and bilateral Secured at: 22 cm Tube secured with: Tape Dental Injury: Teeth and Oropharynx as per pre-operative assessment  Comments: Microlaryngeal tube requested by surgeon. Smooth atraumatic intubation, no complications noted

## 2022-11-06 NOTE — Telephone Encounter (Signed)
Left message for patient to call back to discuss medication and office visit

## 2022-11-06 NOTE — Op Note (Signed)
11/06/2022  12:03 PM    Jake Kirby  [id number]    Pre-Op Diagnosis:  Right Vocal cord nodule, Laryngopharyngeal reflux  Post-op Diagnosis: Right Vocal cord nodule, Laryngopharyngeal reflux  Procedure:  Microlaryngoscopy with excision of right vocal cord lesion  Surgeon:  Sandi Mealy., MD  Anesthesia:  General Endotracheal  EBL:  minimal  Complications:  None  Findings:  polypoid lesion involving the anterior right vocal cord  Procedure: With the patient in a comfortable supine position, general endotracheal anesthesia was induced without difficulty.  At an appropriate level, the table was turned 90 degrees away from Anesthesia.  A clean preparation and draping was performed in the standard fashion.  A tooth guard was placed. Using the Dedo laryngoscope, the oropharynx, hypopharynx and larynx were carefully inspected. The patient was placed into suspension using the Lewy arm, visualizing the endolarynx. Photodocumentation of the laryngeal lesion was obtained using the 0 degree scope.   Using microsurgical technique, a polypoid growth was grasped with cup forceps and carefully excised from the right vocal cord with the microlaryngeal scissors, taking care to avoid injury to the underlying vocalis muscle. Minor bleeding was controlled with Afrin moistened pledgets.  The findings were as described above.  The laryngoscope was removed.  At this point the procedure was completed.  Dental status was intact.  The patient was returned to Anesthesia, awakened, extubated, and transferred to PACU in satisfactory condition.   Disposition: To PACU, then discharge home  Plan: Soft, bland diet, advance as tolerated. Take pain medications as prescribed. Voice rest for 2 weeks. Follow-up in 3 weeks.  Sandi Mealy 11/06/2022 12:03 PM

## 2022-11-07 ENCOUNTER — Encounter: Payer: Self-pay | Admitting: Otolaryngology

## 2022-11-07 LAB — SURGICAL PATHOLOGY

## 2023-04-24 LAB — LAB REPORT - SCANNED
EGFR: 89
TSH: 1.57 (ref 0.41–5.90)

## 2023-07-31 NOTE — Patient Instructions (Signed)
 Below is our plan:  We will continue levetiracetam  500mg  twice daily. Please keep a close eye on your blood pressure at home   Please make sure you are consistent with timing of seizure medication. I recommend annual visit with primary care provider (PCP) for complete physical and routine blood work. I recommend daily intake of vitamin D (400-800iu) and calcium  (800-1000mg ) for bone health. Discuss Dexa screening with PCP.   According to [place] law, you can not drive unless you are seizure / syncope free for at least 6 months and under physician's care.  Please maintain precautions. Do not participate in activities where a loss of awareness could harm you or someone else. No swimming alone, no tub bathing, no hot tubs, no driving, no operating motorized vehicles (cars, ATVs, motocycles, etc), lawnmowers, power tools or firearms. No standing at heights, such as rooftops, ladders or stairs. Avoid hot objects such as stoves, heaters, open fires. Wear a helmet when riding a bicycle, scooter, skateboard, etc. and avoid areas of traffic. Set your water heater to 120 degrees or less.  SUDEP is the sudden, unexpected death of someone with epilepsy, who was otherwise healthy. In SUDEP cases, no other cause of death is found when an autopsy is done. Each year, more than 1 in 1,000 people with epilepsy die from SUDEP. This is the leading cause of death in people with uncontrolled seizures. Until further answers are available, the best way to prevent SUDEP is to lower your risk by controlling seizures. Research has found that people with all types of epilepsy that experience convulsive seizures can be at risk.  Please make sure you are staying well hydrated. I recommend 50-60 ounces daily. Well balanced diet and regular exercise encouraged. Consistent sleep schedule with 6-8 hours recommended.   Please continue follow up with care team as directed.   Follow up with me in 1 year   You may receive a survey regarding  today's visit. I encourage you to leave honest feed back as I do use this information to improve patient care. Thank you for seeing me today!

## 2023-07-31 NOTE — Progress Notes (Signed)
 Chief Complaint  Patient presents with   Seizures    Rm 2 alone  Pt is well and stable, reports no sz or new concerns since last visit.       HISTORY OF PRESENT ILLNESS:  08/04/23 ALL:  Jake Kirby returns for follow up for seizures. He continues lev 500mg  BID. He is tolerating it well with no obvious adverse effects. He denies seizure activity. He reports hurting his back while working and currently planning surgery. BP has been a little higher due to pain. He continues HTN agents as prescribed. Home BP readings have been 150-160s. He is asymptomatic, today.   07/31/2022 ALL:  Jake Kirby returns for follow up for seizures. He continues levetiracetam  500mg  BID. He is tolerating med well. No seizures since last being seen. He is taking meds consistently. He does report feeling off if he misses med. BP is usually lower at home. He reports systolic readings of around 155. Not usually lower than 140. He reports taking all HTN agents at bedtime. We have discussed hydralazine  dosing is BID. He is asymptomatic, today.   07/30/2021 ALL: Jake Kirby is a 58 y.o. male here today for follow up for seizures. He was restarted on levetiracetam  500mg  BID at consult 07/2020. When taking consistently he feels well. He tried to wean medication in 09/2020 but felt that he started to notice more generalized shaking and more zoning out. No definite seizure activity. BP is elevated. Home readings are 130-140. He has not taken his BP meds, today. He is s/p right total hip replacement 12/28. He is recovering well but feels pain is causing his BP to be elevated, today.   HISTORY (copied from Dr Chancy previous note)  58 year old male here for evaluation of seizures.  History of hypertension, tobacco abuse, obstructive sleep apnea.  On 10/7/1 patient was at home and not feeling well, had multiple generalized seizures witnessed by family.  Patient brought to the hospital by ambulance.  He was transferred from [place]  [facility] to [facility] for long-term EEG monitoring.  Multiple EEGs were performed which showed no epileptiform discharges.  Patient was felt to have combination of epileptic and nonepileptic spells.  He was discharged on levetiracetam  5 mg twice a day.  Patient completed this dosing through the end of November and then ran out of medication.  Since that time has had intermittent episodes of staring spells, jittery shaking sensation, amnesia.  Episodes can last 1 or 2 minutes at a time.  Afterwards he feels tired.   REVIEW OF SYSTEMS: Out of a complete 14 system review of symptoms, the patient complains only of the following symptoms, right hip pain, and all other reviewed systems are negative.   ALLERGIES: No Known Allergies   HOME MEDICATIONS: Outpatient Medications Prior to Visit  Medication Sig Dispense Refill   amLODipine  (NORVASC ) 5 MG tablet Take 2 tablets (10 mg total) by mouth daily. 30 tablet 0   atorvastatin  (LIPITOR) 20 MG tablet Take 1 tablet (20 mg total) by mouth daily. 90 tablet 0   chlorthalidone  (HYGROTON ) 25 MG tablet Take 1 tablet (25 mg total) by mouth daily. 90 tablet 2   gabapentin (NEURONTIN) 300 MG capsule Take 1 capsule twice a day by oral route as needed.     hydrALAZINE  (APRESOLINE ) 25 MG tablet Take 1 tablet (25 mg total) by mouth in the morning and at bedtime. 180 tablet 1   HYDROcodone -acetaminophen  (NORCO/VICODIN) 5-325 MG tablet Take 1-2 tablets by mouth every 6 (  six) hours as needed for moderate pain. 20 tablet 0   isosorbide  mononitrate (IMDUR ) 120 MG 24 hr tablet Take 1 tablet (120 mg total) by mouth daily. 90 tablet 3   lisinopril  (ZESTRIL ) 40 MG tablet Take 1 tablet (40 mg total) by mouth daily. 90 tablet 2   omeprazole (PRILOSEC) 40 MG capsule      levETIRAcetam  (KEPPRA ) 500 MG tablet Take 1 tablet (500 mg total) by mouth 2 (two) times daily. 180 tablet 3   No facility-administered medications prior to visit.     PAST MEDICAL HISTORY: Past  Medical History:  Diagnosis Date   Aortic atherosclerosis (HCC)    Aortic root dilatation (HCC) 06/13/2020   a.) TTE 06/13/2020: Ao root 40 mm   Arthritis    Cardiomegaly    Chronic pain syndrome    Depression    Diastolic dysfunction 06/13/2020   a.) TTE 06/13/2020: EF 55-60%, mod LVH, mild RVE, G1DD   Essential hypertension    Headache    Hyperlipidemia    Lumbar radiculopathy    a.) s/p L5-S1 fusion   Morbid obesity with BMI of 40.0-44.9, adult (HCC)    Parietal lobe infarction (CVA) 04/27/2020   a.) MRI brain 04/27/2020: remote left parietal infarct --> has affected memory and caused left sided weakness/will occ drag left foot   Seizure (HCC)    last seizure was beginning of 2022   Sleep apnea    has not f/u about getting cpap   Thoracic aortic aneurysm (HCC)    a.) a.) cCTA 07/20/2020: asc Ao aneurysm measuring at least 4.5 cm; b.) cCTA 07/24/2020: at least 4.5 cm.   Vocal cord nodule (RIGHT) 09/2022     PAST SURGICAL HISTORY: Past Surgical History:  Procedure Laterality Date   COLONOSCOPY WITH PROPOFOL  N/A 02/12/2019   Procedure: COLONOSCOPY WITH PROPOFOL ;  Surgeon: Janalyn Keene NOVAK, MD;  Location: [facility] ENDOSCOPY;  Service: Endoscopy;  Laterality: N/A;   COLONOSCOPY WITH PROPOFOL  N/A 09/03/2021   Procedure: COLONOSCOPY WITH PROPOFOL ;  Surgeon: Therisa Bi, MD;  Location[facility] ENDOSCOPY;  Service: Gastroenterology;  Laterality: N/A;   COLONOSCOPY WITH PROPOFOL  N/A 09/13/2021   Procedure: COLONOSCOPY WITH PROPOFOL ;  Surgeon: Therisa Bi, MD;  Location: [facility] ENDOSCOPY;  Service: Gastroenterology;  Laterality: N/A;   COLONOSCOPY WITH PROPOFOL  N/A 09/18/2021   Procedure: COLONOSCOPY WITH PROPOFOL ;  Surgeon: Therisa Bi, MD;  Location: [facility] ENDOSCOPY;  Service: Gastroenterology;  Laterality: N/A;   LUMBAR FUSION  2004   L5-S1   MICROLARYNGOSCOPY Right 11/06/2022   Procedure: MICROLARYNGOSCOPY WITH EXCISION OF VOCAL CORD NODULE;  Surgeon: Blair Mt, MD;  Location:  [facility] ORS;  Service: ENT;  Laterality: Right;   TOTAL HIP ARTHROPLASTY Right 07/18/2021   Procedure: TOTAL HIP ARTHROPLASTY ANTERIOR APPROACH;  Surgeon: Leora Lynwood SAUNDERS, MD;  Location: [facility] ORS;  Service: Orthopedics;  Laterality: Right;     FAMILY HISTORY: Family History  Problem Relation Age of Onset   Hypertension Mother    Diabetes Mother    Heart attack Father 78     SOCIAL HISTORY: Social History   Socioeconomic History   Marital status: Married    Spouse name: Ellouise   Number of children: 3   Years of education: some college   Highest education level: Not on file  Occupational History   Occupation: probation officer  Tobacco Use   Smoking status: Light Smoker    Types: Cigarettes   Smokeless tobacco: Never   Tobacco comments:    social smoker when having a beer.  Vaping Use   Vaping status: Never Used  Substance and Sexual Activity   Alcohol use: Yes    Comment: social - at most 5 beers per month (smoked consistently from ages [age])   Drug use: No   Sexual activity: Not on file  Other Topics Concern   Not on file  Social History Narrative   Lives with family.   Right-handed.   3 biological children, 3 stepchildren   Caffeine use: 2 cups per day.   Social Drivers of Corporate Investment Banker Strain: Not on file  Food Insecurity: Not on file  Transportation Needs: Not on file  Physical Activity: Not on file  Stress: Not on file  Social Connections: Not on file  Intimate Partner Violence: Not on file     PHYSICAL EXAM  Vitals:   08/04/23 0936 08/04/23 0938 08/04/23 0940  BP: (!) 208/138 (!) 187/124 (!) 174/122  Pulse: 89 79 90  Weight: 280 lb (127 kg)    Height: 5' 8 (1.727 m)       Body mass index is 42.57 kg/m.  Generalized: Well developed, in no acute distress  Cardiology: normal rate and rhythm, no murmur auscultated  Respiratory: clear to auscultation bilaterally    Neurological examination  Mentation: Alert oriented to time, place,  history taking. Follows all commands speech and language fluent Cranial nerve II-XII: Pupils were equal round reactive to light. Extraocular movements were full, visual field were full on confrontational test. Facial sensation and strength were normal. Uvula tongue midline. Head turning and shoulder shrug  were normal and symmetric. Motor: The motor testing reveals 5 over 5 strength of all 4 extremities. Good symmetric motor tone is noted throughout.  Gait and station: gait normal    DIAGNOSTIC DATA (LABS, IMAGING, TESTING) - I reviewed patient records, labs, notes, testing and imaging myself where available.  Lab Results  Component Value Date   WBC 5.7 10/15/2022   HGB 13.8 10/15/2022   HCT 42.8 10/15/2022   MCV 84.1 10/15/2022   PLT 223 10/15/2022      Component Value Date/Time   NA 138 10/15/2022 1540   NA 143 05/29/2021 0853   K 3.3 (L) 10/15/2022 1540   CL 100 10/15/2022 1540   CO2 26 10/15/2022 1540   GLUCOSE 93 10/15/2022 1540   BUN 24 (H) 10/15/2022 1540   BUN 13 05/29/2021 0853   CREATININE 1.25 (H) 10/15/2022 1540   CREATININE 1.38 (H) 03/19/2022 1243   CALCIUM  8.7 (L) 10/15/2022 1540   PROT 7.0 03/19/2022 1243   PROT 7.2 05/29/2021 0853   ALBUMIN 4.1 05/29/2021 0853   AST 14 03/19/2022 1243   ALT 14 03/19/2022 1243   ALKPHOS 83 05/29/2021 0853   BILITOT 0.4 03/19/2022 1243   BILITOT 0.3 05/29/2021 0853   GFRNONAA >60 10/15/2022 1540   GFRAA 82 06/20/2020 1030   Lab Results  Component Value Date   CHOL 243 (H) 03/19/2022   HDL 44 03/19/2022   LDLCALC 175 (H) 03/19/2022   TRIG 112 03/19/2022   CHOLHDL 5.5 (H) 03/19/2022   Lab Results  Component Value Date   HGBA1C 5.8 (H) 04/29/2020   No results found for: VITAMINB12 Lab Results  Component Value Date   TSH 1.77 03/19/2022        No data to display               No data to display           ASSESSMENT AND  PLAN  58 y.o. year old male  has a past medical history of Aortic  atherosclerosis (HCC), Aortic root dilatation (HCC) (06/13/2020), Arthritis, Cardiomegaly, Chronic pain syndrome, Depression, Diastolic dysfunction (06/13/2020), Essential hypertension, Headache, Hyperlipidemia, Lumbar radiculopathy, Morbid obesity with BMI of 40.0-44.9, adult (HCC), Parietal lobe infarction (CVA) (04/27/2020), Seizure [facility]), Sleep apnea, Thoracic aortic aneurysm (HCC), and Vocal cord nodule (RIGHT) (09/2022). here with    Seizure disorder (HCC)  Sotirios is tolerating levetiracetam  well. No seizure activity. We will continue levetiracetam  500mg  BID. He was advised to take medication consistently. BP is elevated. He is asymptomatic. Home readings usually 150-160 systolic. He will call PCP for continued concerns of elevation. He has consult with cardiology soon. Healthy lifestyle habits encouraged. He will follow up with me in 1 year, sooner if needed.    No orders of the defined types were placed in this encounter.    Meds ordered this encounter  Medications   levETIRAcetam  (KEPPRA ) 500 MG tablet    Sig: Take 1 tablet (500 mg total) by mouth 2 (two) times daily.    Dispense:  180 tablet    Refill:  3    Supervising Provider:   INES ONETHA NOVAK [[id number]]      Greig Forbes, MSN, FNP-C 08/04/2023, 9:55 AM  [facility] [address] [place], [place] [address] [phone]

## 2023-08-04 ENCOUNTER — Ambulatory Visit: Payer: Commercial Managed Care - PPO | Admitting: Family Medicine

## 2023-08-04 ENCOUNTER — Encounter: Payer: Self-pay | Admitting: Family Medicine

## 2023-08-04 VITALS — BP 174/122 | HR 90 | Ht 68.0 in | Wt 280.0 lb

## 2023-08-04 DIAGNOSIS — G40909 Epilepsy, unspecified, not intractable, without status epilepticus: Secondary | ICD-10-CM

## 2023-08-04 MED ORDER — LEVETIRACETAM 500 MG PO TABS: 500.0000 mg | ORAL_TABLET | Freq: Two times a day (BID) | ORAL | 3 refills | Status: DC

## 2023-08-07 ENCOUNTER — Telehealth: Payer: Self-pay | Admitting: Medical

## 2023-08-07 NOTE — Telephone Encounter (Signed)
   Pre-operative Risk Assessment    Patient Name: Jake Kirby  DOB: [birth year] MRN: [id number]   Date of last office visit: 06/25/21 Date of next office visit: 08/08/23   Request for Surgical Clearance    Procedure:  Non Cardiac Surgery  Date of Surgery:  Clearance TBD                                Surgeon:  Bradley Ferris Group or Practice Name:  [facility]  Phone number:  [phone] Fax number:  [phone]   Type of Clearance Requested:   - Medical    Type of Anesthesia:  Not Indicated   Additional requests/questions:    Signed, Narda Amber   08/07/2023, 4:30 PM

## 2023-08-08 ENCOUNTER — Ambulatory Visit: Payer: Commercial Managed Care - PPO | Attending: Medical | Admitting: Medical

## 2023-08-08 ENCOUNTER — Encounter: Payer: Self-pay | Admitting: Medical

## 2023-08-08 VITALS — BP 150/92 | HR 91 | Ht 68.0 in | Wt 279.6 lb

## 2023-08-08 DIAGNOSIS — Z01818 Encounter for other preprocedural examination: Secondary | ICD-10-CM

## 2023-08-08 DIAGNOSIS — E782 Mixed hyperlipidemia: Secondary | ICD-10-CM | POA: Diagnosis not present

## 2023-08-08 DIAGNOSIS — I77810 Thoracic aortic ectasia: Secondary | ICD-10-CM

## 2023-08-08 DIAGNOSIS — I10 Essential (primary) hypertension: Secondary | ICD-10-CM | POA: Diagnosis not present

## 2023-08-08 DIAGNOSIS — G4733 Obstructive sleep apnea (adult) (pediatric): Secondary | ICD-10-CM

## 2023-08-08 MED ORDER — HYDRALAZINE HCL 50 MG PO TABS: 50.0000 mg | ORAL_TABLET | Freq: Two times a day (BID) | ORAL | 3 refills | Status: AC

## 2023-08-08 NOTE — Progress Notes (Unsigned)
Cardiology Office Note:    Date:  08/08/2023   ID:  Jake Kirby, DOB Mar 15, 1966, MRN [id number]  PCP:  Corky Downs, MD  [facility] Cardiologist:  Yvonne Kendall, MD  [facility] Electrophysiologist:  None   Referring MD: Corky Downs, MD   Chief Complaint: Pre-op evaluation  History of Present Illness:    Jake Kirby is a 58 y.o. male with a hx of HTN, OSA, seizure, mild dilation of ascending aorta, chest pain, dyspnea who presents for follow-up.    He was seen in consult by Dr. Okey Dupre for evaluation of hypertension. He was hospitalized 04/28/20 after witnessed seizure, head CT notable for intracranial lipoma and remote left parietal stroke versus focal cortical dysplasia.     When seen by Dr. Okey Dupre he noted fatigue, orthopnea without functional CPAP, nonexertional chest pain, and dyspnea on exertion. Murmur was noted on exam. He was recommended for echo and carotid duplex with consideration of cardiac CTA at follow up. He was started on Chlorthalidone for improved BP control. Carotid duplex 06/13/20 with no evidence of plaque nor stenosis bilaterally. Echo 06/13/20 with LVEF 55-60%, unable to evaluate wall motion, moderate LVH, grade 1 diastolic dysfunction, RV mildly enlarged, no significant valvular abnormalities, mild dilation of aortic root (40mm), mildly dilated pulmonary artery.    Seen 05/2020 and had a recent ER visit for chest pain and sob. Cardiac CTA was ordered. Hydralazine was started for high BP. Cardiac CTA showed calcium score of 0, no CAD.  Patient was last seen in December 2022 blood pressure was still high.  Memory was not good since the stroke and wife was not with the patient.  Imdur was increased.  Today,  patient is having lumbar surgery. He had an injury at work to his lower back. He had a lower back surgery 20 years ago. The patient has been doing well from a cardiac perspective. BP is a litte high, likely from the pain. He denies chest pain, SOB,  orthopnea, pnd, lightheadedness, dizziness, palpitations. He can walk 1-2 blocks. Can walk up a flight of stairs. He has OSA but does not use CPAP  Past Medical History:  Diagnosis Date   Aortic atherosclerosis (HCC)    Aortic root dilatation (HCC) 06/13/2020   a.) TTE 06/13/2020: Ao root 40 mm   Arthritis    Cardiomegaly    Chronic pain syndrome    Depression    Diastolic dysfunction 06/13/2020   a.) TTE 06/13/2020: EF 55-60%, mod LVH, mild RVE, G1DD   Essential hypertension    Headache    Hyperlipidemia    Lumbar radiculopathy    a.) s/p L5-S1 fusion   Morbid obesity with BMI of 40.0-44.9, adult (HCC)    Parietal lobe infarction (CVA) 04/27/2020   a.) MRI brain 04/27/2020: remote left parietal infarct --> has affected memory and caused left sided weakness/will occ drag left foot   Seizure (HCC)    last seizure was beginning of 2022   Sleep apnea    has not f/u about getting cpap   Thoracic aortic aneurysm (HCC)    a.) a.) cCTA 07/20/2020: asc Ao aneurysm measuring at least 4.5 cm; b.) cCTA 07/24/2020: at least 4.5 cm.   Vocal cord nodule (RIGHT) 09/2022    Past Surgical History:  Procedure Laterality Date   COLONOSCOPY WITH PROPOFOL N/A 02/12/2019   Procedure: COLONOSCOPY WITH PROPOFOL;  Surgeon: Pasty Spillers, MD;  Location: [facility] ENDOSCOPY;  Service: Endoscopy;  Laterality: N/A;   COLONOSCOPY WITH PROPOFOL  N/A 09/03/2021   Procedure: COLONOSCOPY WITH PROPOFOL;  Surgeon: Wyline Mood, MD;  Location: [facility] ENDOSCOPY;  Service: Gastroenterology;  Laterality: N/A;   COLONOSCOPY WITH PROPOFOL N/A 09/13/2021   Procedure: COLONOSCOPY WITH PROPOFOL;  Surgeon: Wyline Mood, MD;  Location: [facility] ENDOSCOPY;  Service: Gastroenterology;  Laterality: N/A;   COLONOSCOPY WITH PROPOFOL N/A 09/18/2021   Procedure: COLONOSCOPY WITH PROPOFOL;  Surgeon: Wyline Mood, MD;  Location: [facility] ENDOSCOPY;  Service: Gastroenterology;  Laterality: N/A;   LUMBAR FUSION  2004   L5-S1    MICROLARYNGOSCOPY Right 11/06/2022   Procedure: MICROLARYNGOSCOPY WITH EXCISION OF VOCAL CORD NODULE;  Surgeon: Geanie Logan, MD;  Location: [facility] ORS;  Service: ENT;  Laterality: Right;   TOTAL HIP ARTHROPLASTY Right 07/18/2021   Procedure: TOTAL HIP ARTHROPLASTY ANTERIOR APPROACH;  Surgeon: Lyndle Herrlich, MD;  Location: [facility] ORS;  Service: Orthopedics;  Laterality: Right;    Current Medications: Current Meds  Medication Sig   amLODipine (NORVASC) 5 MG tablet Take 2 tablets (10 mg total) by mouth daily.   atorvastatin (LIPITOR) 20 MG tablet Take 1 tablet (20 mg total) by mouth daily.   chlorthalidone (HYGROTON) 25 MG tablet Take 1 tablet (25 mg total) by mouth daily.   gabapentin (NEURONTIN) 300 MG capsule Take 1 capsule twice a day by oral route as needed.   HYDROcodone-acetaminophen (NORCO/VICODIN) 5-325 MG tablet Take 1-2 tablets by mouth every 6 (six) hours as needed for moderate pain.   isosorbide mononitrate (IMDUR) 120 MG 24 hr tablet Take 1 tablet (120 mg total) by mouth daily.   levETIRAcetam (KEPPRA) 500 MG tablet Take 1 tablet (500 mg total) by mouth 2 (two) times daily.   omeprazole (PRILOSEC) 40 MG capsule    [DISCONTINUED] hydrALAZINE (APRESOLINE) 25 MG tablet Take 1 tablet (25 mg total) by mouth in the morning and at bedtime.     Allergies:   Patient has no known allergies.   Social History   Socioeconomic History   Marital status: Married    Spouse name: Inetta Fermo   Number of children: 3   Years of education: some college   Highest education level: Not on file  Occupational History   Occupation: Probation officer  Tobacco Use   Smoking status: Light Smoker    Types: Cigarettes   Smokeless tobacco: Never   Tobacco comments:    social smoker when having a beer.   Vaping Use   Vaping status: Never Used  Substance and Sexual Activity   Alcohol use: Yes    Comment: social - at most 5 beers per month (smoked consistently from ages [age])   Drug use: No   Sexual activity:  Not on file  Other Topics Concern   Not on file  Social History Narrative   Lives with family.   Right-handed.   3 biological children, 3 stepchildren   Caffeine use: 2 cups per day.   Social Drivers of Corporate investment banker Strain: Not on file  Food Insecurity: Not on file  Transportation Needs: Not on file  Physical Activity: Not on file  Stress: Not on file  Social Connections: Not on file     Family History: The patient's family history includes Diabetes in his mother; Heart attack (age of onset: 104) in his father; Hypertension in his mother.  ROS:   Please see the history of present illness.     All other systems reviewed and are negative.  EKGs/Labs/Other Studies Reviewed:    The following studies were reviewed today:  Cardiac CTA 06/2020  IMPRESSION: 1. Coronary calcium score of 0. Patient is low risk for coronary events   2. Normal coronary origin with right dominance.   3. No evidence of CAD.   4. CAD-RADS 0. Consider non-atherosclerotic causes of chest pain.   Electronically Signed: By: Debbe Odea M.D. On: 07/20/2020 16:51  Echo 05/2020 1. Left ventricular ejection fraction, by estimation, is 55 to 60%. The  left ventricle has normal function. Left ventricular endocardial border  not optimally defined to evaluate regional wall motion. There is moderate  left ventricular hypertrophy. Left  ventricular diastolic parameters are consistent with Grade I diastolic  dysfunction (impaired relaxation).   2. Right ventricular systolic function is normal. The right ventricular  size is mildly enlarged. Tricuspid regurgitation signal is inadequate for  assessing PA pressure.   3. The mitral valve is normal in structure. No evidence of mitral valve  regurgitation. No evidence of mitral stenosis.   4. The aortic valve has an indeterminant number of cusps. Aortic valve  regurgitation is not visualized. No aortic stenosis is present.   5. Aortic  dilatation noted. There is mild dilatation of the aortic root,  measuring 40 mm.   6. Mildly dilated pulmonary artery.    EKG:  EKG is ordered today.  The ekg ordered today demonstrates NSR 91bpm, nonspecific ST/t wave changes, qtc  Recent Labs: 10/15/2022: BUN 24; Creatinine, Ser 1.25; Hemoglobin 13.8; Platelets 223; Potassium 3.3; Sodium 138  Recent Lipid Panel    Component Value Date/Time   CHOL 243 (H) 03/19/2022 1243   TRIG 112 03/19/2022 1243   HDL 44 03/19/2022 1243   CHOLHDL 5.5 (H) 03/19/2022 1243   LDLCALC 175 (H) 03/19/2022 1243     Physical Exam:    VS:  BP (!) 150/92 (BP Location: Left Arm, Patient Position: Sitting, Cuff Size: Normal)   Pulse 91   Ht 5\' 8"  (1.727 m)   Wt 279 lb 9.6 oz (126.8 kg)   SpO2 94%   BMI 42.51 kg/m     Wt Readings from Last 3 Encounters:  08/08/23 279 lb 9.6 oz (126.8 kg)  08/04/23 280 lb (127 kg)  11/06/22 265 lb (120.2 kg)     GEN:  Well nourished, well developed in no acute distress HEENT: Normal NECK: No JVD; No carotid bruits LYMPHATICS: No lymphadenopathy CARDIAC: RRR, no murmurs, rubs, gallops RESPIRATORY:  Clear to auscultation without rales, wheezing or rhonchi  ABDOMEN: Soft, non-tender, non-distended MUSCULOSKELETAL:  No edema; No deformity  SKIN: Warm and dry NEUROLOGIC:  Alert and oriented x 3 PSYCHIATRIC:  Normal affect   ASSESSMENT:    1. Pre-op evaluation   2. Aortic root dilation (HCC)   3. Hyperlipidemia, mixed   4. Essential hypertension   5. OSA (obstructive sleep apnea)    PLAN:    In order of problems listed above:  Mild dilation of the aortic root CT scan in 2022 showed ascending thoracic aorta measuring 4.2 cm. I will repeat CTA of the aorta.    HTN Blood pressure mildly elevated today, seems it is elevated at most appointments.  I will increase hydralazine to 50 mg twice a day.  Continue amlodipine 10 mg daily, Imdur 120 mg daily, lisinopril 40 mg daily, and chlorthalidone 25 mg  daily.  Hyperlipidemia LDL 175, total chol 243, HDL 44, TG 112. Patient needs updated labs, can address at follow-up.  Continue Lipitor 20 mg daily.  Obesity Patient reports 10 pound weight loss.  Function is  limited with back pain.  Diet changes discussed today.  OSA Patient reports he does not use CPAP and is not interested in revisiting this.  Pre-op evaluation for lumbar surgery Patient had an injury at work to his back.  He is to undergo lumbar surgery, no date has been set yet.  Patient is overall stable from a cardiac perspective.  He denies chest pain, shortness of breath, lower leg edema, heart racing, palpitations, dizziness, lightheadedness.  Blood pressure needs better control, increase hydralazine as above.  I will repeat imaging of his aortic root.  Patient needs updated labs, PCP normally does this.  Function is limited by back pain, however patient remains fairly functional.  He can walk up 2 flights of stairs and walk 1-2 blocks.  METs greater than 4. According to RCRI he is low risk at 3.9% risk of MACE.  No further cardiac workup prior to surgery.  Disposition: Follow up in 3 months with MD/APP    Signed, Lambros Cerro David Stall, PA-C  08/08/2023 3:03 PM    [facility]

## 2023-08-08 NOTE — Patient Instructions (Signed)
Medication Instructions:  Your physician recommends the following medication changes.  INCREASE: Hydralazine to 50 mg by mouth twice a day   *If you need a refill on your cardiac medications before your next appointment, please call your pharmacy*   Lab Work: No labs ordered today    Testing/Procedures: CT Angiography (CTA) chest/aorta, is a special type of CT scan that uses a computer to produce multi-dimensional views of major blood vessels throughout the body. In CT angiography, a contrast material is injected through an IV to help visualize the blood vessels  Nothing to eat or drink 4 hours prior to test  [facility] [address]  [place], [place] [address]    Follow-Up: At [facility], you and your health needs are our priority.  As part of our continuing mission to provide you with exceptional heart care, we have created designated Provider Care Teams.  These Care Teams include your primary Cardiologist (physician) and Advanced Practice Providers (APPs -  Physician Assistants and Nurse Practitioners) who all work together to provide you with the care you need, when you need it.  We recommend signing up for the patient portal called "MyChart".  Sign up information is provided on this After Visit Summary.  MyChart is used to connect with patients for Virtual Visits (Telemedicine).  Patients are able to view lab/test results, encounter notes, upcoming appointments, etc.  Non-urgent messages can be sent to your provider as well.   To learn more about what you can do with MyChart, go to [email or web address].    Your next appointment:   3 month(s)  Provider:   You may see Yvonne Kendall, MD or one of the following Advanced Practice Providers on your designated Care Team:   Nicolasa Ducking, NP Eula Listen, PA-C Cadence Fransico Michael, PA-C Charlsie Quest, NP Carlos Levering, NP

## 2023-08-08 NOTE — Telephone Encounter (Signed)
   Name: Jake Kirby  DOB: 10-30-1965  MRN: [id number]  Primary Cardiologist: Yvonne Kendall, MD  Chart reviewed as part of pre-operative protocol coverage. The patient has an upcoming visit scheduled with Cadence Furth, PA on 08/08/23 at which time clearance can be addressed in case there are any issues that would impact surgical recommendations.  I added preop FYI to appointment note so that provider is aware to address at time of outpatient visit.  Per office protocol the cardiology provider should forward their finalized clearance decision and recommendations regarding antiplatelet therapy to the requesting party below.    I will route this message as FYI to requesting party and remove this message from the preop box as separate preop APP input not needed at this time.   Please call with any questions.  Napoleon Form, Leodis Rains, NP  08/08/2023, 9:00 AM

## 2023-08-12 ENCOUNTER — Ambulatory Visit: Payer: Commercial Managed Care - PPO

## 2023-08-15 ENCOUNTER — Ambulatory Visit: Admission: RE | Admit: 2023-08-15 | Payer: Commercial Managed Care - PPO | Source: Ambulatory Visit

## 2023-08-24 NOTE — Progress Notes (Unsigned)
MRN : [id number]  Jake Kirby is a 58 y.o. (1966/04/01) male who presents with chief complaint of check circulation.  History of Present Illness:   The patient presents to the office for evaluation of an ascending thoracic aortic aneurysm. The aneurysm was found incidentally by CT scan. Patient denies chest pain or unusual back pain, no other chest or abdominal complaints.  No history of an abrupt onset of a painful toe associated with blue discoloration.     No family history of TAA/AAA.   Patient denies amaurosis fugax or TIA symptoms.  There is no history of claudication or rest pain symptoms of the lower extremities.   The patient denies angina or shortness of breath.  CT scan dated 07/20/2020, is reviewed by me and shows an ascending TAA that measures 4.1 cm   No outpatient medications have been marked as taking for the 08/25/23 encounter (Appointment) with Gilda Crease Latina Craver, MD.    Past Medical History:  Diagnosis Date   Aortic atherosclerosis (HCC)    Aortic root dilatation (HCC) 06/13/2020   a.) TTE 06/13/2020: Ao root 40 mm   Arthritis    Cardiomegaly    Chronic pain syndrome    Depression    Diastolic dysfunction 06/13/2020   a.) TTE 06/13/2020: EF 55-60%, mod LVH, mild RVE, G1DD   Essential hypertension    Headache    Hyperlipidemia    Lumbar radiculopathy    a.) s/p L5-S1 fusion   Morbid obesity with BMI of 40.0-44.9, adult (HCC)    Parietal lobe infarction (CVA) 04/27/2020   a.) MRI brain 04/27/2020: remote left parietal infarct --> has affected memory and caused left sided weakness/will occ drag left foot   Seizure (HCC)    last seizure was beginning of 2022   Sleep apnea    has not f/u about getting cpap   Thoracic aortic aneurysm (HCC)    a.) a.) cCTA 07/20/2020: asc Ao aneurysm measuring at least 4.5 cm; b.) cCTA 07/24/2020: at least 4.5 cm.   Vocal cord nodule (RIGHT) 09/2022     Past Surgical History:  Procedure Laterality Date   COLONOSCOPY WITH PROPOFOL N/A 02/12/2019   Procedure: COLONOSCOPY WITH PROPOFOL;  Surgeon: Pasty Spillers, MD;  Location: [facility] ENDOSCOPY;  Service: Endoscopy;  Laterality: N/A;   COLONOSCOPY WITH PROPOFOL N/A 09/03/2021   Procedure: COLONOSCOPY WITH PROPOFOL;  Surgeon: Wyline Mood, MD;  Location: [facility] ENDOSCOPY;  Service: Gastroenterology;  Laterality: N/A;   COLONOSCOPY WITH PROPOFOL N/A 09/13/2021   Procedure: COLONOSCOPY WITH PROPOFOL;  Surgeon: Wyline Mood, MD;  Location: [facility] ENDOSCOPY;  Service: Gastroenterology;  Laterality: N/A;   COLONOSCOPY WITH PROPOFOL N/A 09/18/2021   Procedure: COLONOSCOPY WITH PROPOFOL;  Surgeon: Wyline Mood, MD;  Location: [facility] ENDOSCOPY;  Service: Gastroenterology;  Laterality: N/A;   LUMBAR FUSION  2004   L5-S1   MICROLARYNGOSCOPY Right 11/06/2022   Procedure: MICROLARYNGOSCOPY WITH EXCISION OF VOCAL CORD NODULE;  Surgeon: Geanie Logan, MD;  Location: [facility] ORS;  Service: ENT;  Laterality: Right;   TOTAL HIP ARTHROPLASTY Right 07/18/2021   Procedure: TOTAL HIP ARTHROPLASTY ANTERIOR APPROACH;  Surgeon: Lyndle Herrlich, MD;  Location: [facility] ORS;  Service: Orthopedics;  Laterality: Right;    Social History Social History   Tobacco Use   Smoking status: Light Smoker    Types: Cigarettes   Smokeless tobacco: Never   Tobacco comments:    social smoker when having a beer.   Vaping Use   Vaping status: Never Used  Substance Use Topics   Alcohol use: Yes    Comment: social - at most 5 beers per month (smoked consistently from ages [age])   Drug use: No    Family History Family History  Problem Relation Age of Onset   Hypertension Mother    Diabetes Mother    Heart attack Father 66    No Known Allergies   REVIEW OF SYSTEMS (Negative unless checked)  Constitutional: [] Weight loss  [] Fever  [] Chills Cardiac: [] Chest pain   [] Chest pressure   [] Palpitations   [] Shortness of breath when  laying flat   [] Shortness of breath with exertion. Vascular:  [x] Pain in legs with walking   [] Pain in legs at rest  [] History of DVT   [] Phlebitis   [] Swelling in legs   [] Varicose veins   [] Non-healing ulcers Pulmonary:   [] Uses home oxygen   [] Productive cough   [] Hemoptysis   [] Wheeze  [] COPD   [] Asthma Neurologic:  [] Dizziness   [] Seizures   [] History of stroke   [] History of TIA  [] Aphasia   [] Vissual changes   [] Weakness or numbness in arm   [] Weakness or numbness in leg Musculoskeletal:   [] Joint swelling   [] Joint pain   [] Low back pain Hematologic:  [] Easy bruising  [] Easy bleeding   [] Hypercoagulable state   [] Anemic Gastrointestinal:  [] Diarrhea   [] Vomiting  [] Gastroesophageal reflux/heartburn   [] Difficulty swallowing. Genitourinary:  [] Chronic kidney disease   [] Difficult urination  [] Frequent urination   [] Blood in urine Skin:  [] Rashes   [] Ulcers  Psychological:  [] History of anxiety   []  History of major depression.  Physical Examination  There were no vitals filed for this visit. There is no height or weight on file to calculate BMI. Gen: WD/WN, NAD Head: [place]/AT, No temporalis wasting.  Ear/Nose/Throat: Hearing grossly intact, nares w/o erythema or drainage Eyes: PER, EOMI, sclera nonicteric.  Neck: Supple, no masses.  No bruit or JVD.  Pulmonary:  Good air movement, no audible wheezing, no use of accessory muscles.  Cardiac: RRR, normal S1, S2, no Murmurs. Vascular:  mild trophic changes, no open wounds Vessel Right Left  Radial Palpable Palpable  PT Not Palpable Not Palpable  DP Not Palpable Not Palpable  Gastrointestinal: soft, non-distended. No guarding/no peritoneal signs.  Musculoskeletal: M/S 5/5 throughout.  No visible deformity.  Neurologic: CN 2-12 intact. Pain and light touch intact in extremities.  Symmetrical.  Speech is fluent. Motor exam as listed above. Psychiatric: Judgment intact, Mood & affect appropriate for pt's clinical situation. Dermatologic:  No rashes or ulcers noted.  No changes consistent with cellulitis.   CBC Lab Results  Component Value Date   WBC 5.7 10/15/2022   HGB 13.8 10/15/2022   HCT 42.8 10/15/2022   MCV 84.1 10/15/2022   PLT 223 10/15/2022    BMET    Component Value Date/Time   NA 138 10/15/2022 1540   NA 143 05/29/2021 0853   K 3.3 (L) 10/15/2022 1540   CL 100 10/15/2022 1540   CO2 26 10/15/2022 1540   GLUCOSE 93 10/15/2022 1540   BUN 24 (H) 10/15/2022 1540  BUN 13 05/29/2021 0853   CREATININE 1.25 (H) 10/15/2022 1540   CREATININE 1.38 (H) 03/19/2022 1243   CALCIUM 8.7 (L) 10/15/2022 1540   GFRNONAA >60 10/15/2022 1540   GFRAA 82 06/20/2020 1030   CrCl cannot be calculated (Patient's most recent lab result is older than the maximum 21 days allowed.).  COAG Lab Results  Component Value Date   INR 1.0 07/04/2021   INR 1.0 04/27/2020    Radiology No results found.   Assessment/Plan There are no diagnoses linked to this encounter.   Levora Dredge, MD  08/24/2023 3:45 PM

## 2023-08-25 ENCOUNTER — Ambulatory Visit (INDEPENDENT_AMBULATORY_CARE_PROVIDER_SITE_OTHER): Payer: Commercial Managed Care - PPO | Admitting: Vascular Surgery

## 2023-08-25 ENCOUNTER — Encounter (INDEPENDENT_AMBULATORY_CARE_PROVIDER_SITE_OTHER): Payer: Self-pay | Admitting: Vascular Surgery

## 2023-08-25 VITALS — BP 89/69 | HR 90 | Resp 18 | Ht 68.0 in | Wt 279.2 lb

## 2023-08-25 DIAGNOSIS — E785 Hyperlipidemia, unspecified: Secondary | ICD-10-CM

## 2023-08-25 DIAGNOSIS — I10 Essential (primary) hypertension: Secondary | ICD-10-CM

## 2023-08-25 DIAGNOSIS — I7121 Aneurysm of the ascending aorta, without rupture: Secondary | ICD-10-CM | POA: Diagnosis not present

## 2023-08-25 DIAGNOSIS — M5416 Radiculopathy, lumbar region: Secondary | ICD-10-CM | POA: Diagnosis not present

## 2023-08-26 ENCOUNTER — Encounter (INDEPENDENT_AMBULATORY_CARE_PROVIDER_SITE_OTHER): Payer: Self-pay | Admitting: Vascular Surgery

## 2023-09-05 ENCOUNTER — Ambulatory Visit
Admission: RE | Admit: 2023-09-05 | Discharge: 2023-09-05 | Disposition: A | Discharge status: Home / Self Care | Payer: Commercial Managed Care - PPO | Source: Ambulatory Visit | Attending: Vascular Surgery | Admitting: Vascular Surgery

## 2023-09-05 DIAGNOSIS — I7121 Aneurysm of the ascending aorta, without rupture: Secondary | ICD-10-CM | POA: Insufficient documentation

## 2023-09-06 NOTE — Progress Notes (Unsigned)
 MRN : [id number]  Jake Kirby is a 58 y.o. (14-Mar-1966) male who presents with chief complaint of check circulation.  History of Present Illness:   The patient presents to the office for evaluation of an ascending thoracic aortic aneurysm. The aneurysm was found incidentally by CT scan. Patient denies chest pain or unusual back pain, no other chest or abdominal complaints.  No history of an abrupt onset of a painful toe associated with blue discoloration.     No family history of TAA/AAA.   Patient denies amaurosis fugax or TIA symptoms.  There is no history of claudication or rest pain symptoms of the lower extremities.   The patient denies angina or shortness of breath.  CT scan dated 09/05/2023, shows an ascending TAA that measures 4.5 cm   No outpatient medications have been marked as taking for the 09/08/23 encounter (Appointment) with Gilda Crease Latina Craver, MD.    Past Medical History:  Diagnosis Date   Aortic atherosclerosis [facility])    Aortic root dilatation (HCC) 06/13/2020   a.) TTE 06/13/2020: Ao root 40 mm   Arthritis    Cardiomegaly    Chronic pain syndrome    Depression    Diastolic dysfunction 06/13/2020   a.) TTE 06/13/2020: EF 55-60%, mod LVH, mild RVE, G1DD   Essential hypertension    Headache    Hyperlipidemia    Lumbar radiculopathy    a.) s/p L5-S1 fusion   Morbid obesity with BMI of 40.0-44.9, adult (HCC)    Parietal lobe infarction (CVA) 04/27/2020   a.) MRI brain 04/27/2020: remote left parietal infarct --> has affected memory and caused left sided weakness/will occ drag left foot   Seizure (HCC)    last seizure was beginning of 2022   Sleep apnea    has not f/u about getting cpap   Thoracic aortic aneurysm (HCC)    a.) a.) cCTA 07/20/2020: asc Ao aneurysm measuring at least 4.5 cm; b.) cCTA 07/24/2020: at least 4.5 cm.   Vocal cord nodule (RIGHT) 09/2022    Past Surgical  History:  Procedure Laterality Date   COLONOSCOPY WITH PROPOFOL N/A 02/12/2019   Procedure: COLONOSCOPY WITH PROPOFOL;  Surgeon: Pasty Spillers, MD;  Location: [facility] ENDOSCOPY;  Service: Endoscopy;  Laterality: N/A;   COLONOSCOPY WITH PROPOFOL N/A 09/03/2021   Procedure: COLONOSCOPY WITH PROPOFOL;  Surgeon: Wyline Mood, MD;  Location: [facility] ENDOSCOPY;  Service: Gastroenterology;  Laterality: N/A;   COLONOSCOPY WITH PROPOFOL N/A 09/13/2021   Procedure: COLONOSCOPY WITH PROPOFOL;  Surgeon: Wyline Mood, MD;  Location: [facility] ENDOSCOPY;  Service: Gastroenterology;  Laterality: N/A;   COLONOSCOPY WITH PROPOFOL N/A 09/18/2021   Procedure: COLONOSCOPY WITH PROPOFOL;  Surgeon: Wyline Mood, MD;  Location: [facility] ENDOSCOPY;  Service: Gastroenterology;  Laterality: N/A;   LUMBAR FUSION  2004   L5-S1   MICROLARYNGOSCOPY Right 11/06/2022   Procedure: MICROLARYNGOSCOPY WITH EXCISION OF VOCAL CORD NODULE;  Surgeon: Geanie Logan, MD;  Location: [facility] ORS;  Service: ENT;  Laterality: Right;   TOTAL HIP ARTHROPLASTY Right 07/18/2021   Procedure: TOTAL HIP ARTHROPLASTY ANTERIOR APPROACH;  Surgeon: Lyndle Herrlich, MD;  Location: [facility] ORS;  Service: Orthopedics;  Laterality: Right;    Social History Social History   Tobacco Use   Smoking status: Light Smoker    Types: Cigarettes   Smokeless tobacco: Never   Tobacco comments:    social smoker when having a beer.   Vaping Use   Vaping status: Never Used  Substance Use Topics   Alcohol use: Yes    Comment: social - at most 5 beers per month (smoked consistently from ages [age])   Drug use: No    Family History Family History  Problem Relation Age of Onset   Hypertension Mother    Diabetes Mother    Heart attack Father 72    No Known Allergies   REVIEW OF SYSTEMS (Negative unless checked)  Constitutional: [] Weight loss  [] Fever  [] Chills Cardiac: [] Chest pain   [] Chest pressure   [] Palpitations   [] Shortness of breath when laying flat    [] Shortness of breath with exertion. Vascular:  [x] Pain in legs with walking   [] Pain in legs at rest  [] History of DVT   [] Phlebitis   [] Swelling in legs   [] Varicose veins   [] Non-healing ulcers Pulmonary:   [] Uses home oxygen   [] Productive cough   [] Hemoptysis   [] Wheeze  [] COPD   [] Asthma Neurologic:  [] Dizziness   [] Seizures   [] History of stroke   [] History of TIA  [] Aphasia   [] Vissual changes   [] Weakness or numbness in arm   [] Weakness or numbness in leg Musculoskeletal:   [] Joint swelling   [] Joint pain   [] Low back pain Hematologic:  [] Easy bruising  [] Easy bleeding   [] Hypercoagulable state   [] Anemic Gastrointestinal:  [] Diarrhea   [] Vomiting  [] Gastroesophageal reflux/heartburn   [] Difficulty swallowing. Genitourinary:  [] Chronic kidney disease   [] Difficult urination  [] Frequent urination   [] Blood in urine Skin:  [] Rashes   [] Ulcers  Psychological:  [] History of anxiety   []  History of major depression.  Physical Examination  There were no vitals filed for this visit. There is no height or weight on file to calculate BMI. Gen: WD/WN, NAD Head: [place]/AT, No temporalis wasting.  Ear/Nose/Throat: Hearing grossly intact, nares w/o erythema or drainage Eyes: PER, EOMI, sclera nonicteric.  Neck: Supple, no masses.  No bruit or JVD.  Pulmonary:  Good air movement, no audible wheezing, no use of accessory muscles.  Cardiac: RRR, normal S1, S2, no Murmurs. Vascular:  mild trophic changes, no open wounds Vessel Right Left  Radial Palpable Palpable  PT Not Palpable Not Palpable  DP Not Palpable Not Palpable  Gastrointestinal: soft, non-distended. No guarding/no peritoneal signs.  Musculoskeletal: M/S 5/5 throughout.  No visible deformity.  Neurologic: CN 2-12 intact. Pain and light touch intact in extremities.  Symmetrical.  Speech is fluent. Motor exam as listed above. Psychiatric: Judgment intact, Mood & affect appropriate for pt's clinical situation. Dermatologic: No rashes or  ulcers noted.  No changes consistent with cellulitis.   CBC Lab Results  Component Value Date   WBC 5.7 10/15/2022   HGB 13.8 10/15/2022   HCT 42.8 10/15/2022   MCV 84.1 10/15/2022   PLT 223 10/15/2022    BMET    Component Value Date/Time   NA 138 10/15/2022 1540   NA 143 05/29/2021 0853   K 3.3 (L) 10/15/2022 1540   CL 100 10/15/2022 1540   CO2 26 10/15/2022 1540   GLUCOSE 93 10/15/2022 1540   BUN 24 (H) 10/15/2022 1540   BUN 13 05/29/2021 0853  CREATININE 1.25 (H) 10/15/2022 1540   CREATININE 1.38 (H) 03/19/2022 1243   CALCIUM 8.7 (L) 10/15/2022 1540   GFRNONAA >60 10/15/2022 1540   GFRAA 82 06/20/2020 1030   CrCl cannot be calculated (Patient's most recent lab result is older than the maximum 21 days allowed.).  COAG Lab Results  Component Value Date   INR 1.0 07/04/2021   INR 1.0 04/27/2020    Radiology No results found.   Assessment/Plan There are no diagnoses linked to this encounter.   Levora Dredge, MD  09/06/2023 3:48 PM

## 2023-09-08 ENCOUNTER — Ambulatory Visit (INDEPENDENT_AMBULATORY_CARE_PROVIDER_SITE_OTHER): Payer: Commercial Managed Care - PPO | Admitting: Vascular Surgery

## 2023-09-08 ENCOUNTER — Encounter (INDEPENDENT_AMBULATORY_CARE_PROVIDER_SITE_OTHER): Payer: Self-pay | Admitting: Vascular Surgery

## 2023-09-08 VITALS — BP 176/113 | HR 75 | Resp 18 | Wt 281.0 lb

## 2023-09-08 DIAGNOSIS — E785 Hyperlipidemia, unspecified: Secondary | ICD-10-CM

## 2023-09-08 DIAGNOSIS — I10 Essential (primary) hypertension: Secondary | ICD-10-CM | POA: Diagnosis not present

## 2023-09-08 DIAGNOSIS — M5416 Radiculopathy, lumbar region: Secondary | ICD-10-CM | POA: Diagnosis not present

## 2023-09-08 DIAGNOSIS — I7121 Aneurysm of the ascending aorta, without rupture: Secondary | ICD-10-CM

## 2023-11-10 NOTE — Progress Notes (Deleted)
  Cardiology Office Note:  .   Date:  11/10/2023  ID:  Jake Kirby, DOB 24-Mar-1966, MRN [id number] PCP: Theron Flavin, MD  [facility] Providers Cardiologist:  Jake Crisp, MD { Click to update primary MD,subspecialty MD or APP then REFRESH:1}   History of Present Illness: Jake Kirby   Jake Kirby is a 58 y.o. male with a hx of HTN, OSA, seizure, mild dilation of ascending aorta, chest pain, dyspnea who presents for follow-up.    He was seen in consult by Dr. Nolan Battle for evaluation of hypertension. He was hospitalized 04/28/20 after witnessed seizure, head CT notable for intracranial lipoma and remote left parietal stroke versus focal cortical dysplasia.  When seen by Dr. Nolan Battle he noted fatigue, orthopnea without functional CPAP, nonexertional chest pain, and dyspnea on exertion. Murmur was noted on exam. He was recommended for echo and carotid duplex with consideration of cardiac CTA at follow up. He was started on Chlorthalidone  for improved BP control. Carotid duplex 06/13/20 with no evidence of plaque nor stenosis bilaterally. Echo 06/13/20 with LVEF 55-60%, unable to evaluate wall motion, moderate LVH, grade 1 diastolic dysfunction, RV mildly enlarged, no significant valvular abnormalities, mild dilation of aortic root (40mm), mildly dilated pulmonary artery.    Seen 05/2020 and had a recent ER visit for chest pain and sob. Cardiac CTA was ordered. Hydralazine  was started for high BP. Cardiac CTA showed calcium  score of 0, no CAD.  The patient was last seen 07/2023 for pre-op visit for lumbar surgery. Hydralazine  was increased for BP control. No further cardiac work-up was pursued prior to surgery.   Today,     ROS: ***  Studies Reviewed: .        *** Risk Assessment/Calculations:   {Does this patient have ATRIAL FIBRILLATION?:[phone]} No BP recorded.  {Refresh Note OR Click here to enter BP  :1}***       Physical Exam:   VS:  There were no vitals taken for this visit.   Wt  Readings from Last 3 Encounters:  09/08/23 281 lb (127.5 kg)  08/25/23 279 lb 3.2 oz (126.6 kg)  08/08/23 279 lb 9.6 oz (126.8 kg)    GEN: Well nourished, well developed in no acute distress NECK: No JVD; No carotid bruits CARDIAC: ***RRR, no murmurs, rubs, gallops RESPIRATORY:  Clear to auscultation without rales, wheezing or rhonchi  ABDOMEN: Soft, non-tender, non-distended EXTREMITIES:  No edema; No deformity   ASSESSMENT AND PLAN: .   ***    {Are you ordering a CV Procedure (e.g. stress test, cath, DCCV, TEE, etc)?   Press F2        :[id number]}  Dispo: ***  Signed, [employer]fall Rebekah Canada, PA-C

## 2023-11-11 ENCOUNTER — Ambulatory Visit: Payer: Commercial Managed Care - PPO | Attending: Medical | Admitting: Medical

## 2023-12-09 ENCOUNTER — Encounter (INDEPENDENT_AMBULATORY_CARE_PROVIDER_SITE_OTHER): Payer: Self-pay

## 2024-01-19 ENCOUNTER — Other Ambulatory Visit: Payer: Self-pay | Admitting: Medical

## 2024-06-14 ENCOUNTER — Other Ambulatory Visit: Payer: Self-pay

## 2024-06-14 ENCOUNTER — Emergency Department

## 2024-06-14 ENCOUNTER — Emergency Department
Admission: EM | Admit: 2024-06-14 | Discharge: 2024-06-14 | Disposition: A | Discharge status: Home / Self Care | Attending: Emergency Medicine | Admitting: Emergency Medicine

## 2024-06-14 ENCOUNTER — Encounter: Payer: Self-pay | Admitting: *Deleted

## 2024-06-14 DIAGNOSIS — I10 Essential (primary) hypertension: Secondary | ICD-10-CM | POA: Diagnosis not present

## 2024-06-14 DIAGNOSIS — M70941 Unspecified soft tissue disorder related to use, overuse and pressure, right hand: Secondary | ICD-10-CM | POA: Insufficient documentation

## 2024-06-14 DIAGNOSIS — S66911A Strain of unspecified muscle, fascia and tendon at wrist and hand level, right hand, initial encounter: Secondary | ICD-10-CM

## 2024-06-14 DIAGNOSIS — Y9389 Activity, other specified: Secondary | ICD-10-CM | POA: Diagnosis not present

## 2024-06-14 MED ORDER — NAPROXEN 500 MG PO TBEC: 500.0000 mg | DELAYED_RELEASE_TABLET | Freq: Two times a day (BID) | ORAL | 0 refills | Status: DC

## 2024-06-14 MED ORDER — PREDNISONE 20 MG PO TABS
60.0000 mg | ORAL_TABLET | Freq: Once | ORAL | Status: AC
  Administered 2024-06-14: 60 mg via ORAL
  Filled 2024-06-14: qty 3

## 2024-06-14 MED ORDER — KETOROLAC TROMETHAMINE 30 MG/ML IJ SOLN
30.0000 mg | Freq: Once | INTRAMUSCULAR | Status: AC
  Administered 2024-06-14: 30 mg via INTRAMUSCULAR
  Filled 2024-06-14: qty 1

## 2024-06-14 MED ORDER — PREDNISONE 50 MG PO TABS: ORAL_TABLET | ORAL | 0 refills | Status: DC

## 2024-06-14 NOTE — Discharge Instructions (Addendum)
 You have been diagnosed with overuse syndrome of the right hand.  Please take naproxen  1 tablet of every 12 hours after breakfast and dinner.  Please take prednisone  1 tablet with breakfast for the next 4 days.  Please use the brace and elevate the right hand.  Please go to [facility] if you need a follow-up.  Please come back to ED or go to your PCP if you have new symptoms symptoms worsen

## 2024-06-14 NOTE — ED Triage Notes (Signed)
 Pt ambulatory to triage.  Pt has pain and swelling to right hand.  No known injury    pt alert  speech clear.

## 2024-06-14 NOTE — ED Notes (Signed)
Patient declined sling.

## 2024-06-14 NOTE — ED Provider Notes (Signed)
 [facility] Provider Note    Event Date/Time   First MD Initiated Contact with Patient 06/14/24 Jake Kirby     (approximate)   History   Hand Injury    HPI  Jake Kirby is a 58 y.o. male    with a past medical history of low back pain, lumbar radiculopathy, aneurysm of ascending aorta seizure disorder, essential hypertension,,  who presents to the ED complaining of pain and swelling of right hand. According to the patient, weeks ago he was working on his sister's car when the symptoms started.  Patient endorses he was doing repetitive movements with his right hand.  Symptoms started a week ago with edema and pain of the right hand.  Patient endorses edema is decreasing.  Patient had a previous episode episode like this that resolved by itself.  Patient denies history of rheumatoid arthritis or gout.  Patient endorses having pain that is worse at night.  Patient denies fever, chills, trauma, lacerations, chest pain, abdominal pain or urinary symptoms.  Patient is here by himself.     Patient Active Problem List   Diagnosis Date Noted   Need for tetanus booster 03/19/2022   Need for shingles vaccine 03/19/2022   History of total hip replacement, right 07/18/2021   Thoracic aortic aneurysm 10/10/2020   Hyperlipidemia 06/02/2020   Screening for malignant neoplasm of prostate 05/19/2020   Chest pain of uncertain etiology 05/17/2020   Morbid obesity (HCC) 05/17/2020   Shortness of breath 05/17/2020   Sleep apnea, obstructive 05/12/2020   Seizure (HCC) 04/27/2020   Essential hypertension 04/12/2020   Tobacco abuse 04/12/2020   History of lumbar fusion (L5-S1) 06/16/2018   Lumbar radiculopathy 06/16/2018   Lumbar facet arthropathy 06/16/2018   Chronic bilateral low back pain with bilateral sciatica 06/16/2018   Chronic pain syndrome 06/16/2018     Physical Exam   Triage Vital Signs: ED Triage Vitals [06/14/24 1705]  Encounter Vitals Group     BP 118/60      Girls Systolic BP Percentile      Girls Diastolic BP Percentile      Boys Systolic BP Percentile      Boys Diastolic BP Percentile      Pulse Rate 90     Resp 18     Temp 100.2 F (37.9 C)     Temp Source Oral     SpO2 98 %     Weight 270 lb (122.5 kg)     Height 5' 8 (1.727 m)     Head Circumference      Peak Flow      Pain Score 3     Pain Loc      Pain Education      Exclude from Growth Chart     Most recent vital signs: Vitals:   06/14/24 1705  BP: 118/60  Pulse: 90  Resp: 18  Temp: 100.2 F (37.9 C)  SpO2: 98%     Physical Exam Vitals and nursing note reviewed.  Vital signs were normal during triage.  General:          Awake, no distress.  CV:                  Good peripheral perfusion.  Resp:               Normal effort. no tachypnea Abd:                 No distention.  Soft nontender Other:              Right wrist: Skin is intact, no ecchymosis or hematomas.  Presence of edema.  Full ROM limited by pain. Right hand: Skin is intact, presence of old eschar at the base of the second metacarpal.  Presence of edema.  Full ROM.  Strength decreased 4/3.  Pulses positive.  Sensation is intact.  Capillary refill 2 seconds. ED Results / Procedures / Treatments   Labs (all labs ordered are listed, but only abnormal results are displayed) Labs Reviewed - No data to display    RADIOLOGY I independently reviewed and interpreted imaging and agree with radiologists findings.      PROCEDURES:  Critical Care performed:   Procedures   MEDICATIONS ORDERED IN ED: Medications  ketorolac  (TORADOL ) 30 MG/ML injection 30 mg (has no administration in time range)  predniSONE  (DELTASONE ) tablet 60 mg (has no administration in time range)   Clinical Course as of 06/14/24 2002  Mon Jun 14, 2024  1931 DG Hand Complete Right Soft tissue swelling over the distal right forearm and hand. No radiopaque soft tissue foreign bodies or soft tissue gas identified. 2. No acute  osseous abnormality. 3. Degenerative changes in the interphalangeal joints, scaphotrapeziotrapezoid joints, radiocarpal joints, and first metacarpophalangeal joint of the right hand and wrist.   [AE]    Clinical Course User Index [AE] Janit Kast, PA-C    IMPRESSION / MDM / ASSESSMENT AND PLAN / ED COURSE  I reviewed the triage vital signs and the nursing notes.  Differential diagnosis includes, but is not limited to, rheumatoid arthritis, joint overuse, fracture, tendinitis, unlikely compartmental syndrome or foreign body.  Patient's presentation is most consistent with acute complicated illness / injury requiring diagnostic workup.   Jake Kirby is a 58 y.o., male who presents today with history of 1 week of right hand edema and pain.  Symptoms are associated after he overuse that he is right-hand fixing a car.  Patient endorses having previous episode like this 1 that resolved by itself.  Patient denies systemic symptoms, patient denies trauma.  Patient states having right hand pain that is worse at night, edema is resolving.  On a physical exam vital signs are normal.  Right wrist and right hand skin is intact, no signs of trauma or lacerations.  Presence of edema.  Wrist full ROM limited by pain and edema.  Pulses positive, sensation is intact.  Right hand, skin is intact, no signs of trauma, no no warmth, no erythema.  Presence of edema that extends from the wrist to the fingers.  Full ROM.  Pulses positive.  Sensation is intact.  Capillary refill 2 seconds.  No signs of cellulitis or compartmental syndrome. Plan Toradol  IM Prednisone  p.o. Wrist brace Sling Referral to orthopedics Patient's diagnosis is consistent with right wrist overuse. I independently reviewed and interpreted imaging and agree with radiologists findings ruling out fracture, dislocation or foreign body.  I did not order any labs physical exam is reassuring.  I did review the patient's allergies and  medications.The patient is in stable and satisfactory condition for discharge home  Patient will be discharged home with prescriptions for naproxen . Patient is to follow up with orthopedics as needed or otherwise directed. Patient is given ED precautions to return to the ED for any worsening or new symptoms. Discussed plan of care with patient, answered all of patient's questions, Patient agreeable to plan of care. Advised patient to take medications according to  the instructions on the label. Discussed possible side effects of new medications. Patient verbalized understanding.  FINAL CLINICAL IMPRESSION(S) / ED DIAGNOSES   Final diagnoses:  Overuse syndrome of right hand, initial encounter     Rx / DC Orders   ED Discharge Orders          Ordered    naproxen  (EC NAPROSYN ) 500 MG EC tablet  2 times daily with meals        06/14/24 2000    predniSONE  (DELTASONE ) 50 MG tablet        06/14/24 2002             Note:  This document was prepared using Dragon voice recognition software and may include unintentional dictation errors.   Janit Kast, PA-C 06/14/24 Jake Arlander [place], MD 06/14/24 2014

## 2024-06-15 ENCOUNTER — Other Ambulatory Visit: Payer: Self-pay | Admitting: Medical

## 2024-06-15 DIAGNOSIS — I10 Essential (primary) hypertension: Secondary | ICD-10-CM

## 2024-07-29 NOTE — Patient Instructions (Signed)
 Below is our plan:  We will continue levetiracetam  500mg  twice daily. Continue to montiro BP at home. Consider sleep eval if concerns of sleep apnea persist.   Please make sure you are consistent with timing of seizure medication. I recommend annual visit with primary care provider (PCP) for complete physical and routine blood work. I recommend daily intake of vitamin D (400-800iu) and calcium  (800-1000mg ) for bone health. Discuss Dexa screening with PCP.   According to [place] law, you can not drive unless you are seizure / syncope free for at least 6 months and under physician's care.  Please maintain precautions. Do not participate in activities where a loss of awareness could harm you or someone else. No swimming alone, no tub bathing, no hot tubs, no driving, no operating motorized vehicles (cars, ATVs, motocycles, etc), lawnmowers, power tools or firearms. No standing at heights, such as rooftops, ladders or stairs. Avoid hot objects such as stoves, heaters, open fires. Wear a helmet when riding a bicycle, scooter, skateboard, etc. and avoid areas of traffic. Set your water heater to 120 degrees or less.  SUDEP is the sudden, unexpected death of someone with epilepsy, who was otherwise healthy. In SUDEP cases, no other cause of death is found when an autopsy is done. Each year, more than 1 in 1,000 people with epilepsy die from SUDEP. This is the leading cause of death in people with uncontrolled seizures. Until further answers are available, the best way to prevent SUDEP is to lower your risk by controlling seizures. Research has found that people with all types of epilepsy that experience convulsive seizures can be at risk.  Please make sure you are staying well hydrated. I recommend 50-60 ounces daily. Well balanced diet and regular exercise encouraged. Consistent sleep schedule with 6-8 hours recommended.   Please continue follow up with care team as directed.   Follow up with me in 1 year    You may receive a survey regarding today's visit. I encourage you to leave honest feed back as I do use this information to improve patient care. Thank you for seeing me today!

## 2024-07-29 NOTE — Progress Notes (Unsigned)
 "   No chief complaint on file.    HISTORY OF PRESENT ILLNESS:  07/29/2024 ALL:  Jake Kirby returns for follow up for seizures. He continues lev 500mg  BID.   08/04/2023 ALL:  Jake Kirby returns for follow up for seizures. He continues lev 500mg  BID. He is tolerating it well with no obvious adverse effects. He denies seizure activity. He reports hurting his back while working and currently planning surgery. BP has been a little higher due to pain. He continues HTN agents as prescribed. Home BP readings have been 150-160s. He is asymptomatic, today.   07/31/2022 ALL:  Jake Kirby returns for follow up for seizures. He continues levetiracetam  500mg  BID. He is tolerating med well. No seizures since last being seen. He is taking meds consistently. He does report feeling off if he misses med. BP is usually lower at home. He reports systolic readings of around 155. Not usually lower than 140. He reports taking all HTN agents at bedtime. We have discussed hydralazine  dosing is BID. He is asymptomatic, today.   07/30/2021 ALL: Jake Kirby is a 59 y.o. male here today for follow up for seizures. He was restarted on levetiracetam  500mg  BID at consult 07/2020. When taking consistently he feels well. He tried to wean medication in 09/2020 but felt that he started to notice more generalized shaking and more zoning out. No definite seizure activity. BP is elevated. Home readings are 130-140. He has not taken his BP meds, today. He is s/p right total hip replacement 12/28. He is recovering well but feels pain is causing his BP to be elevated, today.   HISTORY (copied from Dr Chancy previous note)  59 year old male here for evaluation of seizures.  History of hypertension, tobacco abuse, obstructive sleep apnea.  On 10/7/1 patient was at home and not feeling well, had multiple generalized seizures witnessed by family.  Patient brought to the hospital by ambulance.  He was transferred from [facility] [facility] to [facility] for long-term EEG monitoring.  Multiple EEGs were performed which showed no epileptiform discharges.  Patient was felt to have combination of epileptic and nonepileptic spells.  He was discharged on levetiracetam  5 mg twice a day.  Patient completed this dosing through the end of November and then ran out of medication.  Since that time has had intermittent episodes of staring spells, jittery shaking sensation, amnesia.  Episodes can last 1 or 2 minutes at a time.  Afterwards he feels tired.   REVIEW OF SYSTEMS: Out of a complete 14 system review of symptoms, the patient complains only of the following symptoms, right hip pain, and all other reviewed systems are negative.   ALLERGIES: No Known Allergies   HOME MEDICATIONS: Outpatient Medications Prior to Visit  Medication Sig Dispense Refill   amLODipine  (NORVASC ) 5 MG tablet TAKE 1 TABLET BY MOUTH DAILY 60 tablet 0   atorvastatin  (LIPITOR) 20 MG tablet Take 1 tablet (20 mg total) by mouth daily. 90 tablet 0   chlorthalidone  (HYGROTON ) 25 MG tablet Take 1 tablet (25 mg total) by mouth daily. 90 tablet 2   gabapentin (NEURONTIN) 300 MG capsule Take 1 capsule twice a day by oral route as needed.     hydrALAZINE  (APRESOLINE ) 50 MG tablet Take 1 tablet (50 mg total) by mouth in the morning and at bedtime. 180 tablet 3   HYDROcodone -acetaminophen  (NORCO/VICODIN) 5-325 MG tablet Take 1-2 tablets by mouth every 6 (six) hours as needed for moderate pain. 20 tablet 0   isosorbide   mononitrate (IMDUR ) 120 MG 24 hr tablet Take 1 tablet (120 mg total) by mouth daily. 90 tablet 3   levETIRAcetam  (KEPPRA ) 500 MG tablet Take 1 tablet (500 mg total) by mouth 2 (two) times daily. 180 tablet 3   lisinopril  (ZESTRIL ) 20 MG tablet TAKE 1 TABLET BY MOUTH DAILY 30 tablet 6   lisinopril  (ZESTRIL ) 40 MG tablet Take 1 tablet (40 mg total) by mouth daily. 90 tablet 2   naproxen  (EC NAPROSYN ) 500 MG EC tablet Take 1 tablet (500 mg total) by mouth 2 (two) times  daily with a meal. 30 tablet 0   omeprazole (PRILOSEC) 40 MG capsule      predniSONE  (DELTASONE ) 50 MG tablet Please take prednisone  with breakfast 4 tablet 0   rosuvastatin (CRESTOR) 10 MG tablet TAKE 1 TABLET BY MOUTH DAILY 30 tablet 6   No facility-administered medications prior to visit.     PAST MEDICAL HISTORY: Past Medical History:  Diagnosis Date   Aortic atherosclerosis    Aortic root dilatation 06/13/2020   a.) TTE 06/13/2020: Ao root 40 mm   Arthritis    Cardiomegaly    Chronic pain syndrome    Depression    Diastolic dysfunction 06/13/2020   a.) TTE 06/13/2020: EF 55-60%, mod LVH, mild RVE, G1DD   Essential hypertension    Headache    Hyperlipidemia    Lumbar radiculopathy    a.) s/p L5-S1 fusion   Morbid obesity with BMI of 40.0-44.9, adult (HCC)    Parietal lobe infarction (CVA) 04/27/2020   a.) MRI brain 04/27/2020: remote left parietal infarct --> has affected memory and caused left sided weakness/will occ drag left foot   Seizure (HCC)    last seizure was beginning of 2022   Sleep apnea    has not f/u about getting cpap   Thoracic aortic aneurysm    a.) a.) cCTA 07/20/2020: asc Ao aneurysm measuring at least 4.5 cm; b.) cCTA 07/24/2020: at least 4.5 cm.   Vocal cord nodule (RIGHT) 09/2022     PAST SURGICAL HISTORY: Past Surgical History:  Procedure Laterality Date   COLONOSCOPY WITH PROPOFOL  N/A 02/12/2019   Procedure: COLONOSCOPY WITH PROPOFOL ;  Surgeon: Janalyn Keene NOVAK, MD;  Location: [facility] ENDOSCOPY;  Service: Endoscopy;  Laterality: N/A;   COLONOSCOPY WITH PROPOFOL  N/A 09/03/2021   Procedure: COLONOSCOPY WITH PROPOFOL ;  Surgeon: Therisa Bi, MD;  Location: [facility] ENDOSCOPY;  Service: Gastroenterology;  Laterality: N/A;   COLONOSCOPY WITH PROPOFOL  N/A 09/13/2021   Procedure: COLONOSCOPY WITH PROPOFOL ;  Surgeon: Therisa Bi, MD;  Location: [facility] ENDOSCOPY;  Service: Gastroenterology;  Laterality: N/A;   COLONOSCOPY WITH PROPOFOL  N/A 09/18/2021    Procedure: COLONOSCOPY WITH PROPOFOL ;  Surgeon: Therisa Bi, MD;  Location: [facility] ENDOSCOPY;  Service: Gastroenterology;  Laterality: N/A;   LUMBAR FUSION  2004   L5-S1   MICROLARYNGOSCOPY Right 11/06/2022   Procedure: MICROLARYNGOSCOPY WITH EXCISION OF VOCAL CORD NODULE;  Surgeon: Blair Mt, MD;  Location: [facility] ORS;  Service: ENT;  Laterality: Right;   TOTAL HIP ARTHROPLASTY Right 07/18/2021   Procedure: TOTAL HIP ARTHROPLASTY ANTERIOR APPROACH;  Surgeon: Leora Lynwood SAUNDERS, MD;  Location: [facility] ORS;  Service: Orthopedics;  Laterality: Right;     FAMILY HISTORY: Family History  Problem Relation Age of Onset   Hypertension Mother    Diabetes Mother    Heart attack Father 28     SOCIAL HISTORY: Social History   Socioeconomic History   Marital status: Married    Spouse name: Ellouise   Number  of children: 3   Years of education: some college   Highest education level: Not on file  Occupational History   Occupation: probation officer  Tobacco Use   Smoking status: Former    Types: Cigarettes   Smokeless tobacco: Never   Tobacco comments:    social smoker when having a beer.   Vaping Use   Vaping status: Never Used  Substance and Sexual Activity   Alcohol use: Yes    Comment: social - at most 5 beers per month (smoked consistently from ages [age])   Drug use: No   Sexual activity: Not on file  Other Topics Concern   Not on file  Social History Narrative   Lives with family.   Right-handed.   3 biological children, 3 stepchildren   Caffeine use: 2 cups per day.   Social Drivers of Health   Tobacco Use: Medium Risk (06/14/2024)   Patient History    Smoking Tobacco Use: Former    Smokeless Tobacco Use: Never    Passive Exposure: Not on Actuary Strain: Not on file  Food Insecurity: Not on file  Transportation Needs: Not on file  Physical Activity: Not on file  Stress: Not on file  Social Connections: Not on file  Intimate Partner Violence: Not on file   Depression (PHQ2-9): Low Risk (03/19/2022)   Depression (PHQ2-9)    PHQ-2 Score: 0  Alcohol Screen: Not on file  Housing: Not on file  Utilities: Not on file  Health Literacy: Not on file     PHYSICAL EXAM  There were no vitals filed for this visit.    There is no height or weight on file to calculate BMI.  Generalized: Well developed, in no acute distress  Cardiology: normal rate and rhythm, no murmur auscultated  Respiratory: clear to auscultation bilaterally    Neurological examination  Mentation: Alert oriented to time, place, history taking. Follows all commands speech and language fluent Cranial nerve II-XII: Pupils were equal round reactive to light. Extraocular movements were full, visual field were full on confrontational test. Facial sensation and strength were normal. Uvula tongue midline. Head turning and shoulder shrug  were normal and symmetric. Motor: The motor testing reveals 5 over 5 strength of all 4 extremities. Good symmetric motor tone is noted throughout.  Gait and station: gait normal    DIAGNOSTIC DATA (LABS, IMAGING, TESTING) - I reviewed patient records, labs, notes, testing and imaging myself where available.  Lab Results  Component Value Date   WBC 5.7 10/15/2022   HGB 13.8 10/15/2022   HCT 42.8 10/15/2022   MCV 84.1 10/15/2022   PLT 223 10/15/2022      Component Value Date/Time   NA 138 10/15/2022 1540   NA 143 05/29/2021 0853   K 3.3 (L) 10/15/2022 1540   CL 100 10/15/2022 1540   CO2 26 10/15/2022 1540   GLUCOSE 93 10/15/2022 1540   BUN 24 (H) 10/15/2022 1540   BUN 13 05/29/2021 0853   CREATININE 1.25 (H) 10/15/2022 1540   CREATININE 1.38 (H) 03/19/2022 1243   CALCIUM  8.7 (L) 10/15/2022 1540   PROT 7.0 03/19/2022 1243   PROT 7.2 05/29/2021 0853   ALBUMIN 4.1 05/29/2021 0853   AST 14 03/19/2022 1243   ALT 14 03/19/2022 1243   ALKPHOS 83 05/29/2021 0853   BILITOT 0.4 03/19/2022 1243   BILITOT 0.3 05/29/2021 0853   GFRNONAA >60  10/15/2022 1540   GFRAA 82 06/20/2020 1030   Lab Results  Component Value Date   CHOL 243 (H) 03/19/2022   HDL 44 03/19/2022   LDLCALC 175 (H) 03/19/2022   TRIG 112 03/19/2022   CHOLHDL 5.5 (H) 03/19/2022   Lab Results  Component Value Date   HGBA1C 5.8 (H) 04/29/2020   No results found for: [id number] Lab Results  Component Value Date   TSH 1.57 04/24/2023        No data to display               No data to display           ASSESSMENT AND PLAN  59 y.o. year old male  has a past medical history of Aortic atherosclerosis, Aortic root dilatation (06/13/2020), Arthritis, Cardiomegaly, Chronic pain syndrome, Depression, Diastolic dysfunction (06/13/2020), Essential hypertension, Headache, Hyperlipidemia, Lumbar radiculopathy, Morbid obesity with BMI of 40.0-44.9, adult (HCC), Parietal lobe infarction (CVA) (04/27/2020), Seizure (HCC), Sleep apnea, Thoracic aortic aneurysm, and Vocal cord nodule (RIGHT) (09/2022). here with    No diagnosis found.  Leopold is tolerating levetiracetam  well. No seizure activity. We will continue levetiracetam  500mg  BID. He was advised to take medication consistently. BP is elevated. He is asymptomatic. Home readings usually 150-160 systolic. He will call PCP for continued concerns of elevation. He has consult with cardiology soon. Healthy lifestyle habits encouraged. He will follow up with me in 1 year, sooner if needed.    No orders of the defined types were placed in this encounter.    No orders of the defined types were placed in this encounter.     Greig Forbes, MSN, FNP-C 07/29/2024, 9:47 PM  [facility] [address], [address] [place], [place] [address] [phone] "

## 2024-08-02 ENCOUNTER — Ambulatory Visit: Payer: Commercial Managed Care - PPO | Admitting: Family Medicine

## 2024-08-02 ENCOUNTER — Encounter: Payer: Self-pay | Admitting: Family Medicine

## 2024-08-02 VITALS — BP 140/90 | HR 98 | Ht 68.0 in | Wt 273.0 lb

## 2024-08-02 DIAGNOSIS — G40909 Epilepsy, unspecified, not intractable, without status epilepticus: Secondary | ICD-10-CM | POA: Diagnosis not present

## 2024-08-02 MED ORDER — LEVETIRACETAM 500 MG PO TABS: 500.0000 mg | ORAL_TABLET | Freq: Two times a day (BID) | ORAL | 3 refills | Status: AC

## 2024-09-06 ENCOUNTER — Ambulatory Visit (INDEPENDENT_AMBULATORY_CARE_PROVIDER_SITE_OTHER): Payer: Commercial Managed Care - PPO | Admitting: Vascular Surgery

## 2025-08-09 ENCOUNTER — Ambulatory Visit: Admitting: Family Medicine
